# Patient Record
Sex: Male | Born: 1970 | Race: White | Hispanic: No | Marital: Married | Smoking: Never smoker
Health system: Southern US, Community
[De-identification: ages and names within clinical notes are randomized; demographics above are authoritative.]

## PROBLEM LIST (undated history)

## (undated) DIAGNOSIS — S83519A Sprain of anterior cruciate ligament of unspecified knee, initial encounter: Secondary | ICD-10-CM

## (undated) DIAGNOSIS — I10 Essential (primary) hypertension: Secondary | ICD-10-CM

## (undated) DIAGNOSIS — Z86711 Personal history of pulmonary embolism: Secondary | ICD-10-CM

## (undated) DIAGNOSIS — I82409 Acute embolism and thrombosis of unspecified deep veins of unspecified lower extremity: Secondary | ICD-10-CM

## (undated) DIAGNOSIS — T8859XA Other complications of anesthesia, initial encounter: Secondary | ICD-10-CM

## (undated) DIAGNOSIS — C801 Malignant (primary) neoplasm, unspecified: Secondary | ICD-10-CM

## (undated) DIAGNOSIS — K429 Umbilical hernia without obstruction or gangrene: Secondary | ICD-10-CM

## (undated) HISTORY — PX: TONSILLECTOMY: SUR1361

## (undated) HISTORY — DX: Umbilical hernia without obstruction or gangrene: K42.9

## (undated) HISTORY — DX: Sprain of anterior cruciate ligament of unspecified knee, initial encounter: S83.519A

---

## 2000-06-14 DIAGNOSIS — S83519A Sprain of anterior cruciate ligament of unspecified knee, initial encounter: Secondary | ICD-10-CM

## 2000-06-14 HISTORY — DX: Sprain of anterior cruciate ligament of unspecified knee, initial encounter: S83.519A

## 2001-06-14 DIAGNOSIS — K429 Umbilical hernia without obstruction or gangrene: Secondary | ICD-10-CM

## 2001-06-14 HISTORY — DX: Umbilical hernia without obstruction or gangrene: K42.9

## 2002-06-14 HISTORY — PX: HERNIA REPAIR: SHX51

## 2003-06-09 ENCOUNTER — Inpatient Hospital Stay (HOSPITAL_COMMUNITY): Admission: EM | Admit: 2003-06-09 | Discharge: 2003-06-10 | Payer: Self-pay | Admitting: Emergency Medicine

## 2016-04-01 ENCOUNTER — Ambulatory Visit (INDEPENDENT_AMBULATORY_CARE_PROVIDER_SITE_OTHER): Payer: Managed Care, Other (non HMO) | Admitting: Physician Assistant

## 2016-04-01 VITALS — BP 138/88 | HR 79 | Temp 97.9°F | Resp 17 | Ht 76.0 in | Wt 292.0 lb

## 2016-04-01 DIAGNOSIS — E669 Obesity, unspecified: Secondary | ICD-10-CM | POA: Insufficient documentation

## 2016-04-01 DIAGNOSIS — K429 Umbilical hernia without obstruction or gangrene: Secondary | ICD-10-CM | POA: Diagnosis not present

## 2016-04-01 DIAGNOSIS — R55 Syncope and collapse: Secondary | ICD-10-CM | POA: Diagnosis not present

## 2016-04-01 DIAGNOSIS — Z131 Encounter for screening for diabetes mellitus: Secondary | ICD-10-CM

## 2016-04-01 DIAGNOSIS — Z6835 Body mass index (BMI) 35.0-35.9, adult: Secondary | ICD-10-CM

## 2016-04-01 DIAGNOSIS — E66811 Obesity, class 1: Secondary | ICD-10-CM | POA: Insufficient documentation

## 2016-04-01 LAB — COMPREHENSIVE METABOLIC PANEL
ALBUMIN: 4.5 g/dL (ref 3.6–5.1)
ALK PHOS: 51 U/L (ref 40–115)
ALT: 24 U/L (ref 9–46)
AST: 18 U/L (ref 10–40)
BUN: 14 mg/dL (ref 7–25)
CHLORIDE: 101 mmol/L (ref 98–110)
CO2: 27 mmol/L (ref 20–31)
CREATININE: 1.08 mg/dL (ref 0.60–1.35)
Calcium: 9.8 mg/dL (ref 8.6–10.3)
Glucose, Bld: 103 mg/dL — ABNORMAL HIGH (ref 65–99)
POTASSIUM: 4.2 mmol/L (ref 3.5–5.3)
Sodium: 139 mmol/L (ref 135–146)
Total Bilirubin: 0.8 mg/dL (ref 0.2–1.2)
Total Protein: 7.3 g/dL (ref 6.1–8.1)

## 2016-04-01 LAB — POCT URINALYSIS DIP (MANUAL ENTRY)
BILIRUBIN UA: NEGATIVE
BILIRUBIN UA: NEGATIVE
GLUCOSE UA: NEGATIVE
LEUKOCYTES UA: NEGATIVE
NITRITE UA: NEGATIVE
Protein Ur, POC: NEGATIVE
Spec Grav, UA: >=1.03
Urobilinogen, UA: 0.2
pH, UA: 5.5

## 2016-04-01 LAB — CBC WITH DIFFERENTIAL/PLATELET
Basophils Absolute: 0 cells/uL (ref 0–200)
Basophils Relative: 0 %
EOS PCT: 0 %
Eosinophils Absolute: 0 cells/uL — ABNORMAL LOW (ref 15–500)
HEMATOCRIT: 46.8 % (ref 38.5–50.0)
Hemoglobin: 15.9 g/dL (ref 13.2–17.1)
LYMPHS PCT: 11 %
Lymphs Abs: 1364 cells/uL (ref 850–3900)
MCH: 30.6 pg (ref 27.0–33.0)
MCHC: 34 g/dL (ref 32.0–36.0)
MCV: 90.2 fL (ref 80.0–100.0)
MONOS PCT: 6 %
MPV: 9.1 fL (ref 7.5–12.5)
Monocytes Absolute: 744 cells/uL (ref 200–950)
NEUTROS PCT: 83 %
Neutro Abs: 10292 cells/uL — ABNORMAL HIGH (ref 1500–7800)
PLATELETS: 301 10*3/uL (ref 140–400)
RBC: 5.19 MIL/uL (ref 4.20–5.80)
RDW: 13.1 % (ref 11.0–15.0)
WBC: 12.4 10*3/uL — AB (ref 3.8–10.8)

## 2016-04-01 LAB — TSH: TSH: 1.32 mIU/L (ref 0.40–4.50)

## 2016-04-01 LAB — POC MICROSCOPIC URINALYSIS (UMFC): MUCUS RE: ABSENT

## 2016-04-01 LAB — LIPID PANEL
CHOLESTEROL: 161 mg/dL (ref 125–200)
HDL: 52 mg/dL (ref >=40)
LDL Cholesterol: 95 mg/dL (ref <130)
TRIGLYCERIDES: 72 mg/dL (ref <150)
Total CHOL/HDL Ratio: 3.1 Ratio (ref <=5.0)
VLDL: 14 mg/dL (ref <30)

## 2016-04-01 LAB — GLUCOSE, POCT (MANUAL RESULT ENTRY): POC Glucose: 106 mg/dl — AB (ref 70–99)

## 2016-04-01 LAB — POCT GLYCOSYLATED HEMOGLOBIN (HGB A1C): Hemoglobin A1C: 5.5

## 2016-04-01 NOTE — Progress Notes (Signed)
Subjective:    Patient ID: James Jordan, male    DOB: 1971/03/23, 45 y.o.   MRN: [id number]  Chief Complaint  Patient presents with  . Near Syncope    Pt would like to be checked for diabetes   Patient Care Team: No Pcp Per Patient as PCP - General (General Practice)  HPI: Presents for "near syncopal" episodes increasing the past few weeks with the most recent one this morning at work. States around 9am this morning at work he was shaking, had "wobbly legs" and felt unsteady and weak, lightheaded, with some associated heart palpitations. To relieve the episode he sat down and was given orange juice which helped, and he was able to drive home from work and get himself here. Pt works at [employer], states he was not doing any heavy lifting at the time. States he does do repetitive lifting at work but nothing particularly heavy or strenuous. Denies chest pain, shortness of breath, dizziness, vision changes, hearing changes, nausea, or vomiting. Does admit to some associated tingling in his hands with these episodes. Mother is an insulin-dependent diabetic, maternal uncle and grandfather also with DM. Endorses unhealthy diet, often eating "fudge squares" or "swiss cakes", coffee, fruit for breakfast and fast-food multiple times a week. Wakes up around 4am for work and states he does not notice these episodes occurring at any certain time. Endorses increased impotence over the past year. 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 Denies polydipsia, polydipsia, polyphagia. Uses smokeless tobacco but denies current or history of cigarette smoking. Last EKG unknown.   Allergies: No known drug, food, or seasonal allergies per patient.   Patient Active Problem List   Diagnosis Date Noted  . BMI 35.0-35.9,adult 04/01/2016   Past Surgical History:  Procedure Laterality Date  . HERNIA REPAIR  [id number]   Umbilical   Family History  Problem Relation Age of Onset   . Diabetes Mother   . Diabetes Maternal Uncle   . Diabetes Maternal Grandfather     Prior to Admission medications   Not on File    Review of Systems  Constitutional: Negative for appetite change, chills, diaphoresis and fever.  HENT: Negative for congestion, ear discharge, ear pain, hearing loss, postnasal drip, rhinorrhea, sore throat and tinnitus.   Eyes: Negative for pain, discharge, redness, itching and visual disturbance.  Respiratory: Negative for cough, chest tightness, shortness of breath and wheezing.   Cardiovascular: Negative for chest pain and leg swelling.  Gastrointestinal: Negative for abdominal pain, blood in stool, constipation and diarrhea.  Genitourinary: Negative for decreased urine volume, difficulty urinating, dysuria, frequency, hematuria and urgency.  Musculoskeletal: Negative for arthralgias, joint swelling and myalgias.  Neurological: Positive for light-headedness. Negative for dizziness, syncope, speech difficulty, numbness and headaches.  Hematological: Negative for adenopathy.       Objective:   Physical Exam General: Obese male, appears stated age and in no apparent distress. HEENT: Head: Normocephalic, atraumatic. Eyes: PERRLA, sclera and conjunctiva clear without injection or icterus. Fundoscopic exam on right and left eyes with appropriate cup to disc ratio, no AV nicking, no papilledema. Ears: Bilateral canals clear with no lesions, tympanic membranes pearly gray, intact with visible boney landmarks and cone of light. Nose: Patent. Mucosa deep pink, glistening with no discharge, masses or lesions. No septum perforations, exudates, or polyps. Throat: Mouth and throat non-erythematous without evidence of tonsillar hypertrophy or exudate. No cobble stoning. Neck: Supple. No thyromegaly or lymphadenopathy. No tracheal deviations or  JVD noted. Pulmonary: Clear to auscultation bilaterally, no wheezes, rhonchi, or rales. No cyanosis or  clubbing. Cardiovascular: Regular rate and rhythm with normal S1 and S2 without murmurs, rubs, or gallops. Popliteal, posterior tibialis, and dorsalis pedis pulses 2+ bilaterally. Abdominal: Normoactive bowel sounds in all four quadrants. Non-tender to light and deep palpation in all four quadrants, no organomegaly, no distention. 1.5 inch umbilical hernia, more pronounced with increased abdominal pressure but spontaneously reduces upon relaxation. No rebound or guarding. Neurological: Awake, alert, oriented. DTRs 2+ bilaterally at patella, biceps, Achilles. Sensation equal bilaterally in lower extremities. Skin: Skin warm and dry. No rashes noted. Psychiatric: Appropriate mood and affect. Fluent speech and normal behavior.   Orthostatic VS for the past 24 hrs:  BP- Lying Pulse- Lying BP- Sitting Pulse- Sitting BP- Standing at 0 minutes Pulse- Standing at 0 minutes  04/01/16 1253 144/84 64 150/85 85 (!) 142/91 87   Orthostatic vitals reviewed, increased pulse from lying to sitting but normal blood pressure.  EKG interpreted and reviewed with Dr. Tamala Julian, 66 bpm, sinus rhythm, left atrial enlargement.  Results for orders placed or performed in visit on 04/01/16  POCT glucose (manual entry)  Result Value Ref Range   POC Glucose 106 (A) 70 - 99 mg/dl  POCT glycosylated hemoglobin (Hb A1C)  Result Value Ref Range   Hemoglobin A1C 5.5   POCT urinalysis dipstick  Result Value Ref Range   Color, UA yellow yellow   Clarity, UA clear clear   Glucose, UA negative negative   Bilirubin, UA negative negative   Ketones, POC UA negative negative   Spec Grav, UA >=1.030    Blood, UA trace-intact (A) negative   pH, UA 5.5    Protein Ur, POC negative negative   Urobilinogen, UA 0.2    Nitrite, UA Negative Negative   Leukocytes, UA Negative Negative  POCT Microscopic Urinalysis ([facility])  Result Value Ref Range   WBC,UR,HPF,POC None None WBC/hpf   RBC,UR,HPF,POC None None RBC/hpf   Bacteria None  None, Too numerous to count   Mucus Absent Absent   Epithelial Cells, UR Per Microscopy None None, Too numerous to count cells/hpf        Assessment & Plan:  1. Near syncope EKG within normal limits, POCT glucose 106 mg/dL, HgbA1C 5.5, orthostatic vital signs within normal limits. UA with increased specific gravity, patient likely dehydrated causing pre-syncopal episodes. Discussed with patient who endorses infrequent water intake and drinking mostly soda and sugary drinks. CBC, CMP, TSH pending.  - POCT glucose (manual entry) - POCT urinalysis dipstick - POCT Microscopic Urinalysis ([facility]) - CBC with Differential/Platelet - Comprehensive metabolic panel - TSH - EKG 12-Lead - Orthostatic vital signs  2. Screening for diabetes mellitus Family history of diabetes mellitus, has not been previously screened. Labs below indicate no current evidence of diabetes. Recommended healthy diet and exercise. - POCT glucose 106 mg/dL  - POCT glycosylated hemoglobin (Hb A1C) 5.5   3. BMI 35.0-35.9,adult Patient obese, endorses unhealthy diet, and rare cardiovascular exercise, lipid panel screening warranted. Recommended initiating more healthy diet and increasing cardiovascular exercise. - Lipid panel  Advised patient to take work off tomorrow, rest, and increase water intake. Recommended to return if symptoms worsen or are not improving.

## 2016-04-01 NOTE — Progress Notes (Signed)
Patient ID: James Jordan, male     DOB: [birth year], 45 y.o.    MRN: [id number]  PCP: No PCP Per Patient  Chief Complaint  Patient presents with  . Near Syncope    Pt would like to be checked for diabetes    Subjective:    HPI  Presents wanting to be checked for diabetes. He is accompanied by his wife.  Today at work, about 9 am, he began to feel jittery, leg weakness/"wobbly," lightheaded, heart palpitations, like he might faint. He clarifies that he did NOT have dizziness/room spinning, visual disturbance, chest pain, SOB, HA. He drank some OJ and sat down for a while, and was able to drive himself home.  He had a similar episode yesterday while driving, though it was not as severe and did not last as long. He also relates other episodes over the past several weeks.  His mother has diabetes, require insulin. He does not eat a healthy diet and does not exercise. Often eats high sugar snacks for breakfast, at 4 am. ED x several months.  No polydipsia, polyuria, polyphagia, weight changes, visual changes, rash.   Prior to Admission medications   Not on File     No Known Allergies   Patient Active Problem List   Diagnosis Date Noted  . BMI 35.0-35.9,adult 04/01/2016     Family History  Problem Relation Age of Onset  . Diabetes Mother   . Diabetes Maternal Uncle   . Diabetes Maternal Grandfather      Social History   Social History  . Marital status: Married    Spouse name: N/A  . Number of children: N/A  . Years of education: N/A   Occupational History  . warehouse    Social History Main Topics  . Smoking status: Never Smoker  . Smokeless tobacco: Current User    Types: Snuff  . Alcohol use No  . Drug use: No  . Sexual activity: Yes   Other Topics Concern  . Not on file   Social History Narrative   Lives with his wife.           Review of Systems As above. No respiratory symptoms. No GU/GI symptoms. No  myalgias/arthralgias.      Objective:  Physical Exam  Constitutional: He is oriented to person, place, and time. He appears well-developed and well-nourished. He is active and cooperative. No distress.  BP 138/88 (BP Location: Right Arm, Patient Position: Sitting, Cuff Size: Large)   Pulse 79   Temp 97.9 F (36.6 C) (Oral)   Resp 17   Ht 6\' 4"  (1.93 m)   Wt 292 lb (132.5 kg)   SpO2 99%   BMI 35.54 kg/m   HENT:  Head: Normocephalic and atraumatic.  Right Ear: Hearing, tympanic membrane, external ear and ear canal normal.  Left Ear: Hearing, tympanic membrane, external ear and ear canal normal.  Nose: Nose normal.  Mouth/Throat: Uvula is midline, oropharynx is clear and moist and mucous membranes are normal.  Eyes: Conjunctivae, EOM and lids are normal. Pupils are equal, round, and reactive to light. No scleral icterus.  Neck: Normal range of motion, full passive range of motion without pain and phonation normal. Neck supple. No thyromegaly present.  Cardiovascular: Normal rate, regular rhythm and normal heart sounds.   Pulses:      Radial pulses are 2+ on the right side, and 2+ on the left side.  Pulmonary/Chest: Effort normal and breath sounds normal.  Abdominal: Normal  appearance and bowel sounds are normal. A hernia is present.    Lymphadenopathy:       Head (right side): No tonsillar, no preauricular, no posterior auricular and no occipital adenopathy present.       Head (left side): No tonsillar, no preauricular, no posterior auricular and no occipital adenopathy present.    He has no cervical adenopathy.       Right: No supraclavicular adenopathy present.       Left: No supraclavicular adenopathy present.  Neurological: He is alert and oriented to person, place, and time. No sensory deficit.  Skin: Skin is warm, dry and intact. No rash noted. No cyanosis or erythema. Nails show no clubbing.  Psychiatric: He has a normal mood and affect. His speech is normal and behavior  is normal.     Results for orders placed or performed in visit on 04/01/16  POCT glucose (manual entry)  Result Value Ref Range   POC Glucose 106 (A) 70 - 99 mg/dl  POCT glycosylated hemoglobin (Hb A1C)  Result Value Ref Range   Hemoglobin A1C 5.5   POCT urinalysis dipstick  Result Value Ref Range   Color, UA yellow yellow   Clarity, UA clear clear   Glucose, UA negative negative   Bilirubin, UA negative negative   Ketones, POC UA negative negative   Spec Grav, UA >=1.030    Blood, UA trace-intact (A) negative   pH, UA 5.5    Protein Ur, POC negative negative   Urobilinogen, UA 0.2    Nitrite, UA Negative Negative   Leukocytes, UA Negative Negative  POCT Microscopic Urinalysis ([facility])  Result Value Ref Range   WBC,UR,HPF,POC None None WBC/hpf   RBC,UR,HPF,POC None None RBC/hpf   Bacteria None None, Too numerous to count   Mucus Absent Absent   Epithelial Cells, UR Per Microscopy None None, Too numerous to count cells/hpf    EKG reviewed with Dr. Tamala Julian. No acute findings. No dysrhythmia or ischemia.    Orthostatic VS for the past 24 hrs:  BP- Lying Pulse- Lying BP- Sitting Pulse- Sitting BP- Standing at 0 minutes Pulse- Standing at 0 minutes  04/01/16 1253 144/84 64 150/85 85 (!) 142/91 87         Assessment & Plan:  1. Near syncope Etiology not clear. Most likely due to inadequate hydration and nutrition. Increase hydration. Improve food choices. Await remaining labs. RTC if symptoms worsen/persist. - POCT glucose (manual entry) - POCT urinalysis dipstick - POCT Microscopic Urinalysis ([facility]) - CBC with Differential/Platelet - Comprehensive metabolic panel - TSH - EKG 12-Lead - Orthostatic vital signs  2. Screening for diabetes mellitus No evidence of diabetes. Healthy eating and regular exercise to reduce risk. - POCT glucose (manual entry) - POCT glycosylated hemoglobin (Hb A1C)  3. BMI 35.0-35.9,adult Healthy lifestyle changes. - Lipid panel  4.  Recurrent umbilical hernia Anticipatory guidance. He'll let us know when he is ready for referral back to surgery for repair.   Fara Chute, PA-C Physician Assistant-Certified [facility]

## 2016-04-01 NOTE — Patient Instructions (Addendum)
There is currently no evidence of diabetes. Your urine is concentrated, indicating that you need to drink more water. Drink at least 64 ounces of water daily. Please work on making healthier eating choices.    IF you received an x-ray today, you will receive an invoice from [facility]. Please contact [facility] at [phone] with questions or concerns regarding your invoice.   IF you received labwork today, you will receive an invoice from [employer]. Please contact [facility] at [phone] with questions or concerns regarding your invoice.   Our billing staff will not be able to assist you with questions regarding bills from these companies.  You will be contacted with the lab results as soon as they are available. The fastest way to get your results is to activate your My Chart account. Instructions are located on the last page of this paperwork. If you have not heard from [place] regarding the results in 2 weeks, please contact this office.

## 2016-04-02 ENCOUNTER — Encounter: Payer: Self-pay | Admitting: Physician Assistant

## 2016-05-25 ENCOUNTER — Emergency Department (HOSPITAL_COMMUNITY): Payer: 59

## 2016-05-25 ENCOUNTER — Emergency Department (HOSPITAL_COMMUNITY)
Admission: EM | Admit: 2016-05-25 | Discharge: 2016-05-25 | Disposition: A | Discharge status: Home / Self Care | Payer: 59 | Attending: Emergency Medicine | Admitting: Emergency Medicine

## 2016-05-25 ENCOUNTER — Encounter (HOSPITAL_COMMUNITY): Payer: Self-pay | Admitting: Emergency Medicine

## 2016-05-25 DIAGNOSIS — R002 Palpitations: Secondary | ICD-10-CM | POA: Diagnosis present

## 2016-05-25 LAB — BASIC METABOLIC PANEL
Anion gap: 11 (ref 5–15)
BUN: 13 mg/dL (ref 6–20)
CHLORIDE: 102 mmol/L (ref 101–111)
CO2: 25 mmol/L (ref 22–32)
CREATININE: 1.13 mg/dL (ref 0.61–1.24)
Calcium: 9.6 mg/dL (ref 8.9–10.3)
GFR calc Af Amer: >60 mL/min (ref >60)
GFR calc non Af Amer: >60 mL/min (ref >60)
GLUCOSE: 127 mg/dL — AB (ref 65–99)
Potassium: 3.9 mmol/L (ref 3.5–5.1)
Sodium: 138 mmol/L (ref 135–145)

## 2016-05-25 LAB — CBC
HCT: 47.8 % (ref 39.0–52.0)
Hemoglobin: 16.2 g/dL (ref 13.0–17.0)
MCH: 30.8 pg (ref 26.0–34.0)
MCHC: 33.9 g/dL (ref 30.0–36.0)
MCV: 90.9 fL (ref 78.0–100.0)
PLATELETS: 316 10*3/uL (ref 150–400)
RBC: 5.26 MIL/uL (ref 4.22–5.81)
RDW: 13.3 % (ref 11.5–15.5)
WBC: 9.3 10*3/uL (ref 4.0–10.5)

## 2016-05-25 NOTE — ED Triage Notes (Signed)
Pt. reports sudden onset palpitations this morning , denies chest pain , no SOB or nausea , mild lightheadedness.

## 2016-05-25 NOTE — ED Provider Notes (Signed)
[place] DEPT Provider Note   CSN: [id number] Arrival date & time: 05/25/16  0540     History   Chief Complaint Chief Complaint  Patient presents with  . Palpitations    HPI James Jordan is a 45 y.o. male.  The history is provided by the patient. No language interpreter was used.  Palpitations      James Jordan is a 45 y.o. male who presents to the Emergency Department complaining of palpitations.  He experienced about 1 hour of palpitations earlier today when he was on his way to work and when he arrived to work. He describes it as a sensation of a rapid heartbeat with associated lightheadedness. Symptoms have completely resolved at this time. He denies any associated chest pain, shortness of breath, diaphoresis, nausea, abdominal pain, leg swelling or pain. He denies any recent illness or poor by mouth intake. He has no medical problems and takes no medications. He does drink occasional caffeine but did not have much caffeine today. He denies any tobacco, alcohol, drug use. He has a family history of an uncle that just recently had cardiac bypass. No other family history of heart disease. No recent surgeries. He did have palpitations 1 month ago that was attributed to dehydration but no other recent events.  Past Medical History:  Diagnosis Date  . ACL tear - left knee 2002   Per patient, not repaired   . Umbilical hernia [id number]    Patient Active Problem List   Diagnosis Date Noted  . BMI 35.0-35.9,adult 04/01/2016    Past Surgical History:  Procedure Laterality Date  . HERNIA REPAIR  123XX123   Umbilical       Home Medications    Prior to Admission medications   Not on File    Family History Family History  Problem Relation Age of Onset  . Diabetes Mother   . Diabetes Maternal Uncle   . Diabetes Maternal Grandfather     Social History Social History  Substance Use Topics  . Smoking status: Never Smoker  . Smokeless tobacco: Current User   Types: Snuff  . Alcohol use No     Allergies   Patient has no known allergies.   Review of Systems Review of Systems  Cardiovascular: Positive for palpitations.  All other systems reviewed and are negative.    Physical Exam Updated Vital Signs BP 124/77   Pulse 75   Temp 97.9 F (36.6 C) (Oral)   Resp 15   Ht 4\' 6"  (1.372 m)   Wt 290 lb (131.5 kg)   SpO2 98%   BMI 69.92 kg/m   Physical Exam  Constitutional: He is oriented to person, place, and time. He appears well-developed and well-nourished.  HENT:  Head: Normocephalic and atraumatic.  Cardiovascular: Normal rate and regular rhythm.   No murmur heard. Pulmonary/Chest: Effort normal and breath sounds normal. No respiratory distress.  Abdominal: Soft. There is no tenderness. There is no rebound and no guarding.  Musculoskeletal: He exhibits no edema or tenderness.  Neurological: He is alert and oriented to person, place, and time.  Skin: Skin is warm and dry.  Psychiatric: He has a normal mood and affect. His behavior is normal.  Nursing note and vitals reviewed.    ED Treatments / Results  Labs (all labs ordered are listed, but only abnormal results are displayed) Labs Reviewed  BASIC METABOLIC PANEL - Abnormal; Notable for the following:       Result Value   Glucose,  Bld 127 (*)    All other components within normal limits  CBC    EKG  EKG Interpretation  Date/Time:  Tuesday May 25 2016 05:46:11 EST Ventricular Rate:  94 PR Interval:  136 QRS Duration: 86 QT Interval:  356 QTC Calculation: 445 R Axis:   77 Text Interpretation:  Sinus rhythm with Premature atrial complexes Nonspecific T wave abnormality Abnormal ECG Confirmed by Hazle Coca [id number] on 05/25/2016 7:27:25 AM       Radiology Dg Chest 2 View  Result Date: 05/25/2016 CLINICAL DATA:  45 y/o  M; palpitations. EXAM: CHEST  2 VIEW COMPARISON:  None. FINDINGS: The heart size and mediastinal contours are within normal limits. Both  lungs are clear. The visualized skeletal structures are unremarkable. IMPRESSION: No active cardiopulmonary disease. Electronically Signed   By: Kristine Garbe M.D.   On: 05/25/2016 06:11    Procedures Procedures (including critical care time)  Medications Ordered in ED Medications - No data to display   Initial Impression / Assessment and Plan / ED Course  I have reviewed the triage vital signs and the nursing notes.  Pertinent labs & imaging results that were available during my care of the patient were reviewed by me and considered in my medical decision making (see chart for details).  Clinical Course as of May 25 1617  Tue May 25, 2016  0727 ED EKG within 10 minutes [ER]    Clinical Course User Index [ER] Quintella Reichert, MD    Patient here for evaluation of palpitations. He has no chest pain or shortness of breath. Current clinical picture is not consistent with ACS, PE, pneumonia. EKG does demonstrate a few PACs. Discussed with patient unclear etiology of his palpitations but he has been asymptomatic during the ED stay. Plan to DC home with cardiology follow-up so he can obtain a Holter monitor for further evaluation. Home care and return precautions discussed.  Final Clinical Impressions(s) / ED Diagnoses   Final diagnoses:  Palpitations    New Prescriptions There are no discharge medications for this patient.    Quintella Reichert, MD 05/25/16 [phone]

## 2016-05-25 NOTE — ED Notes (Signed)
Pt ambulated back to room from waiting room, tolerated well.

## 2016-05-26 ENCOUNTER — Telehealth: Payer: Self-pay | Admitting: Interventional Cardiology

## 2016-05-26 NOTE — Telephone Encounter (Signed)
New message  Pt is experiencing some nervousness/anxiety first thing in am   Please call pt back and discuss

## 2016-05-26 NOTE — Telephone Encounter (Signed)
Returned call.  Patient has appt with Dr Irish Lack on Monday.  He was asking if he could have some anxiety issue causing his palpitations.  I advised that he she his PCP if he is having unusual anxiety.

## 2016-06-01 ENCOUNTER — Encounter: Payer: Self-pay | Admitting: Interventional Cardiology

## 2016-06-01 ENCOUNTER — Encounter (INDEPENDENT_AMBULATORY_CARE_PROVIDER_SITE_OTHER): Payer: Self-pay

## 2016-06-01 ENCOUNTER — Ambulatory Visit (INDEPENDENT_AMBULATORY_CARE_PROVIDER_SITE_OTHER): Payer: 59 | Admitting: Interventional Cardiology

## 2016-06-01 VITALS — BP 126/68 | HR 72 | Ht 76.0 in | Wt 295.0 lb

## 2016-06-01 DIAGNOSIS — R002 Palpitations: Secondary | ICD-10-CM

## 2016-06-01 DIAGNOSIS — I491 Atrial premature depolarization: Secondary | ICD-10-CM

## 2016-06-01 NOTE — Progress Notes (Signed)
Cardiology Office Note   Date:  06/01/2016   ID:  [facility], DOB 1970/11/23, MRN [id number]  PCP:  No PCP Per Patient    No chief complaint on file. Follow-up palpitations   Wt Readings from Last 3 Encounters:  06/01/16 295 lb (133.8 kg)  05/25/16 290 lb (131.5 kg)  04/01/16 292 lb (132.5 kg)       History of Present Illness: James [facility] is a 45 y.o. male  Who had a dizziness a few months ago.  This resolved with more water intake.    Last week, he had more lightrheadedness, but not as severe as it was in 10/19.  He got anxious and felt his heart racing.  He describes it as a sensation of a rapid heartbeat with associated lightheadedness.  He denies any associated chest pain, shortness of breath, diaphoresis, nausea, abdominal pain, leg swelling or pain.  He walks a lot at work.  Now works in a Proofreader.  He walks carrying items.  No cardiac sx with this.     Past Medical History:  Diagnosis Date  . ACL tear - left knee 2002   Per patient, not repaired   . Umbilical hernia [id number]    Past Surgical History:  Procedure Laterality Date  . HERNIA REPAIR  [id number]   Umbilical     No current outpatient prescriptions on file.   No current facility-administered medications for this visit.     Allergies:   Patient has no known allergies.    Social History:  The patient  reports that he has never smoked. His smokeless tobacco use includes Snuff. He reports that he does not drink alcohol or use drugs.   Family History:  The patient's family history includes Diabetes in his maternal grandfather, maternal uncle, and mother; Heart disease in his maternal aunt.    ROS:  Please see the history of present illness.   Otherwise, review of systems are positive for one episode of palpitations.   All other systems are reviewed and negative.    PHYSICAL EXAM: VS:  BP 126/68   Pulse 72   Ht 6\' 4"  (1.93 m)   Wt 295 lb (133.8 kg)   BMI 35.91 kg/m  , BMI Body mass  index is 35.91 kg/m. GEN: Well nourished, well developed, in no acute distress  HEENT: normal  Neck: no JVD, carotid bruits, or masses Cardiac: RRR; no murmurs, rubs, or gallops,no edema  Respiratory:  clear to auscultation bilaterally, normal work of breathing GI: soft, nontender, nondistended, + BS MS: no deformity or atrophy  Skin: warm and dry, no rash Neuro:  Strength and sensation are intact Psych: euthymic mood, full affect   EKG:   The ekg ordered 12/21 demonstrated NSR, inferior ST changes, occasional PACs   Recent Labs: 04/01/2016: ALT 24; TSH 1.32 05/25/2016: BUN 13; Creatinine, Ser 1.13; Hemoglobin 16.2; Platelets 316; Potassium 3.9; Sodium 138   Lipid Panel    Component Value Date/Time   CHOL 161 04/01/2016 1246   TRIG 72 04/01/2016 1246   HDL 52 04/01/2016 1246   CHOLHDL 3.1 04/01/2016 1246   VLDL 14 04/01/2016 1246   [place] 95 04/01/2016 1246     Other studies Reviewed: Additional studies/ records that were reviewed today with results demonstrating: ER notes reviewed.   ASSESSMENT AND PLAN:  1. Palpitations: He states he was very anxious when the palpitations happen. He was feeling some lightheadedness after not drinking a lot of water. Symptoms have  resolved. He is back to staying well hydrated.  Palpitations have resolved. No chest discomfort. He is very active at work. 2. PACs: This was noted on his last ECG. He could have palpitations symptoms from PACs it's unclear whether the 2 are related. 3. His exam is normal. There is no evidence of structural heart disease. No evidence of heart failure. This is unlikely to be a ventricular arrhythmia. He has not had syncope. Workup in the emergency room was negative. At this point, an outpatient monitor would likely be low yield. If he has more symptoms, he'll let us know and we can reconsider an outpatient monitor. I encouraged him to continue to be physically active. If he has any change in his symptoms, he should  let us know.   Current medicines are reviewed at length with the patient today.  The patient concerns regarding his medicines were addressed.  The following changes have been made:  No change  Labs/ tests ordered today include:  No orders of the defined types were placed in this encounter.   Recommend 150 minutes/week of aerobic exercise Low fat, low carb, high fiber diet recommended  Disposition:   FU prn; call if palpitations return   Signed, Larae Grooms, MD  06/01/2016 11:18 AM    [address], [place], [place]  [address] Phone: [phone]; Fax: [phone]

## 2016-06-01 NOTE — Patient Instructions (Signed)
Medication Instructions:  Same-no changes  Labwork: None  Testing/Procedures: None  Follow-Up: As needed     If you need a refill on your cardiac medications before your next appointment, please call your pharmacy.   

## 2016-07-05 ENCOUNTER — Ambulatory Visit: Payer: 59 | Admitting: Interventional Cardiology

## 2018-01-08 IMAGING — CR DG CHEST 2V
2 series · 2 of 2 positions shown · non-contrast
Comparison: None.

CLINICAL DATA: 45 y/o  M; palpitations.

EXAM:
CHEST  2 VIEW

[chest pa]
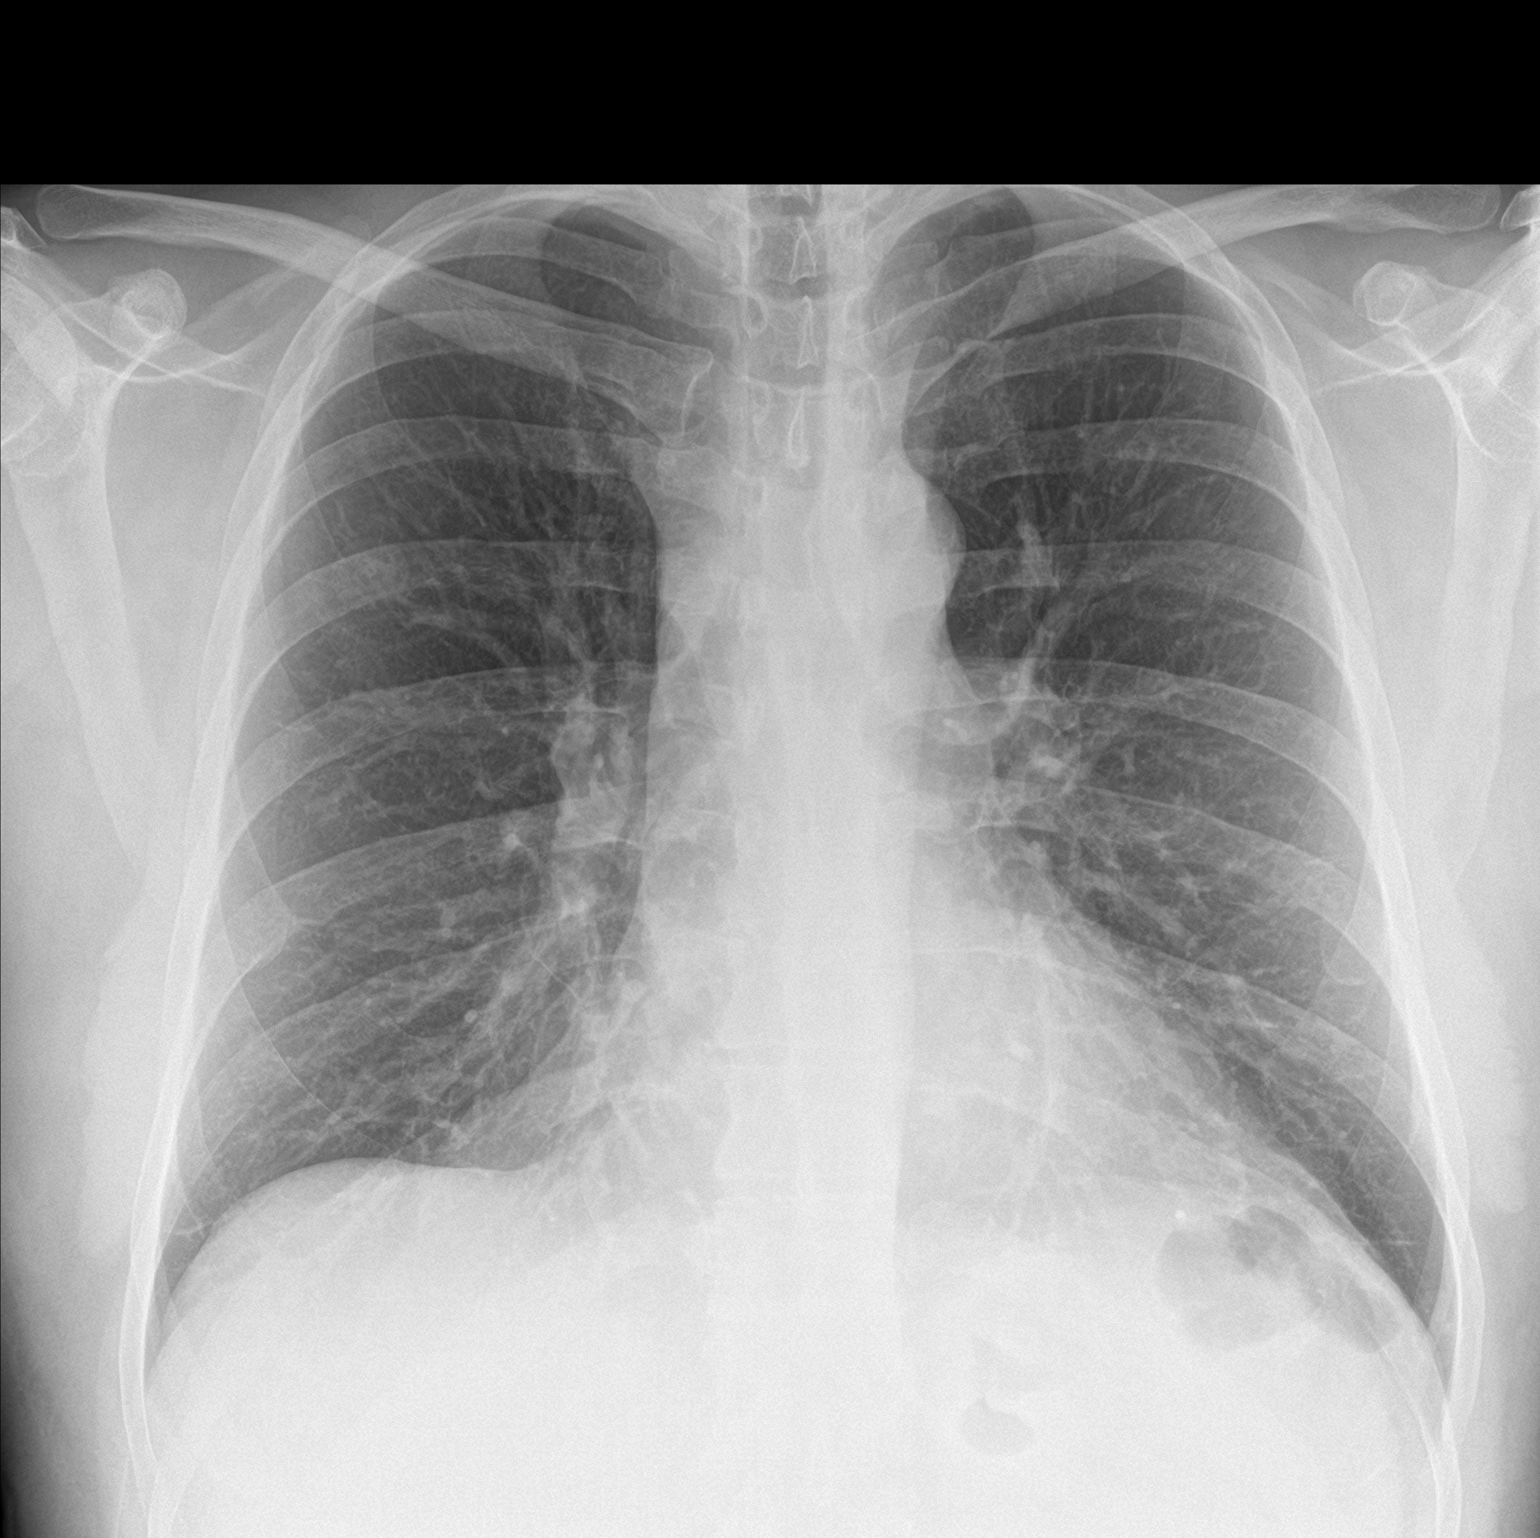

[chest lat]
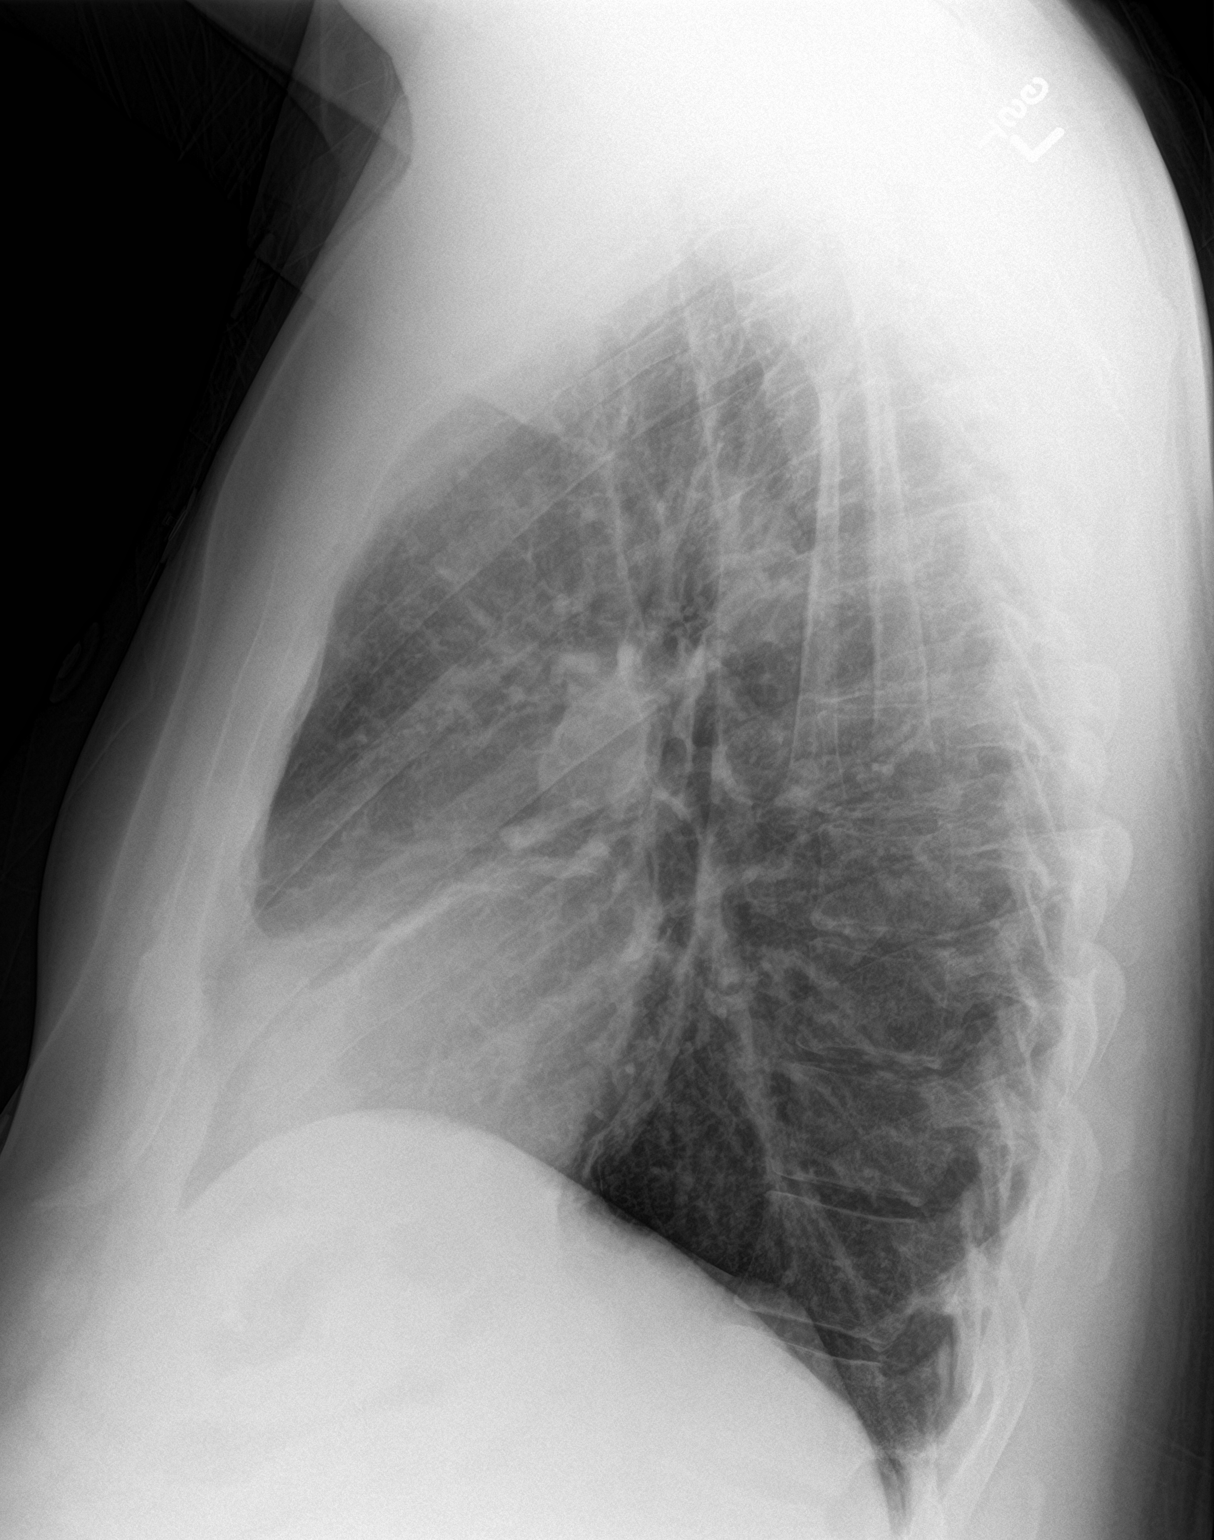

[2 of 2 positions shown; findings below may reference images not displayed]

FINDINGS: The heart size and mediastinal contours are within normal limits.
Both lungs are clear. The visualized skeletal structures are
unremarkable.
IMPRESSION: No active cardiopulmonary disease.

By: Azu Loco M.D.

## 2018-06-14 HISTORY — PX: EYE SURGERY: SHX253

## 2018-10-19 ENCOUNTER — Encounter (HOSPITAL_COMMUNITY): Payer: Self-pay | Admitting: Emergency Medicine

## 2018-10-19 ENCOUNTER — Emergency Department (HOSPITAL_COMMUNITY): Payer: 59

## 2018-10-19 ENCOUNTER — Emergency Department (HOSPITAL_COMMUNITY)
Admission: EM | Admit: 2018-10-19 | Discharge: 2018-10-20 | Disposition: A | Discharge status: Home / Self Care | Payer: 59 | Attending: Emergency Medicine | Admitting: Emergency Medicine

## 2018-10-19 ENCOUNTER — Other Ambulatory Visit: Payer: Self-pay

## 2018-10-19 DIAGNOSIS — R002 Palpitations: Secondary | ICD-10-CM | POA: Diagnosis not present

## 2018-10-19 DIAGNOSIS — R42 Dizziness and giddiness: Secondary | ICD-10-CM

## 2018-10-19 LAB — BASIC METABOLIC PANEL
Anion gap: 13 (ref 5–15)
BUN: 16 mg/dL (ref 6–20)
CO2: 24 mmol/L (ref 22–32)
Calcium: 9.4 mg/dL (ref 8.9–10.3)
Chloride: 103 mmol/L (ref 98–111)
Creatinine, Ser: 1.18 mg/dL (ref 0.61–1.24)
GFR calc Af Amer: >60 mL/min (ref >60)
GFR calc non Af Amer: >60 mL/min (ref >60)
Glucose, Bld: 115 mg/dL — ABNORMAL HIGH (ref 70–99)
Potassium: 3.7 mmol/L (ref 3.5–5.1)
Sodium: 140 mmol/L (ref 135–145)

## 2018-10-19 LAB — CBC
HCT: 46.7 % (ref 39.0–52.0)
Hemoglobin: 15.4 g/dL (ref 13.0–17.0)
MCH: 30.2 pg (ref 26.0–34.0)
MCHC: 33 g/dL (ref 30.0–36.0)
MCV: 91.6 fL (ref 80.0–100.0)
Platelets: 320 10*3/uL (ref 150–400)
RBC: 5.1 MIL/uL (ref 4.22–5.81)
RDW: 12.8 % (ref 11.5–15.5)
WBC: 8.8 10*3/uL (ref 4.0–10.5)
nRBC: 0 % (ref 0.0–0.2)

## 2018-10-19 LAB — TROPONIN I: Troponin I: <0.03 ng/mL (ref <0.03)

## 2018-10-19 MED ORDER — SODIUM CHLORIDE 0.9% FLUSH: 3.0000 mL | Freq: Once | INTRAVENOUS | Status: DC

## 2018-10-19 NOTE — ED Provider Notes (Signed)
[facility] EMERGENCY DEPARTMENT Provider Note   CSN: [id number] Arrival date & time: 10/19/18  2101    History   Chief Complaint Chief Complaint  Patient presents with  . Tachycardia    HPI James [facility] is a 48 y.o. male with no PMHx presenting to the ED with lightheadedness and heart palpitations. He states his lightheadedness started a few weeks ago. 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 He states once he steps outside his lightheadedness resolves and other times it just goes away on its own. These episodes last anywhere from a few minutes to half an hour.  He noticed that his heart was racing this evening after he had dinner.  He was at rest when his symptoms started.  He states he had similar symptoms of lightheadedness and heart palpitations couple years ago.  At that time his cardiac work-up was normal he was told it was due to dehydration.  He states that he drinks about 2 Gatorade's at work daily and tries to increase water intake in the evenings.  He denies fevers, chills, chest pain, SOB, nausea vomiting, changes in his vision, headaches, dizziness, vertigo, weakness, gait instability, diarrhea or constipation, abdominal pain.  He states that he has noticed his right eye has been more red on the lower lid than usual.    HPI  Past Medical History:  Diagnosis Date  . ACL tear - left knee 2002   Per patient, not repaired   . Umbilical hernia 2025    Patient Active Problem List   Diagnosis Date Noted  . PAC (premature atrial contraction) 06/01/2016  . BMI 35.0-35.9,adult 04/01/2016    Past Surgical History:  Procedure Laterality Date  . HERNIA REPAIR  4270   Umbilical        Home Medications    Prior to Admission medications   Not on File    Family History Family History  Problem Relation Age of Onset  . Diabetes Mother   .  Diabetes Maternal Uncle   . Diabetes Maternal Grandfather   . Heart disease Maternal Aunt     Social History Social History   Tobacco Use  . Smoking status: Never Smoker  . Smokeless tobacco: Current User    Types: Snuff  Substance Use Topics  . Alcohol use: No  . Drug use: No     Allergies   Patient has no known allergies.   Review of Systems Review of Systems  Constitutional: Negative for activity change, appetite change, chills, diaphoresis, fatigue, fever and unexpected weight change.  HENT: Negative for congestion, ear pain, rhinorrhea, sinus pressure, sinus pain and sore throat.   Eyes: Positive for redness. Negative for photophobia, pain, discharge, itching and visual disturbance.  Respiratory: Negative for cough, chest tightness and shortness of breath.   Cardiovascular: Negative for chest pain.  Gastrointestinal: Negative for abdominal distention, abdominal pain, blood in stool, constipation, diarrhea, nausea and vomiting.  Genitourinary: Negative for difficulty urinating, dysuria, hematuria and urgency.  Musculoskeletal: Negative for arthralgias, back pain, gait problem and myalgias.  Neurological: Positive for light-headedness. Negative for dizziness, weakness and headaches.     Physical Exam Updated Vital Signs BP 121/80   Pulse 68   Resp 15   Ht 6\' 4"  (1.93 m)   Wt 132.9 kg   SpO2 98%   BMI 35.67 kg/m   Physical Exam Constitutional:  General: He is not in acute distress.    Appearance: Normal appearance. He is obese.  HENT:     Head: Normocephalic and atraumatic.  Eyes:     Extraocular Movements: Extraocular movements intact.     Conjunctiva/sclera: Conjunctivae normal.     Pupils: Pupils are equal, round, and reactive to light.  Neck:     Musculoskeletal: Normal range of motion and neck supple. No muscular tenderness.  Cardiovascular:     Rate and Rhythm: Normal rate and regular rhythm.     Pulses: Normal pulses.     Heart sounds: Normal  heart sounds.  Pulmonary:     Effort: Pulmonary effort is normal. No respiratory distress.     Breath sounds: No wheezing or rales.  Abdominal:     General: Abdomen is flat. Bowel sounds are normal. There is no distension.     Palpations: Abdomen is soft.     Tenderness: There is no abdominal tenderness.  Musculoskeletal:        General: No swelling or tenderness.     Right lower leg: No edema.     Left lower leg: No edema.  Skin:    General: Skin is warm and dry.  Neurological:     General: No focal deficit present.     Mental Status: He is alert and oriented to person, place, and time.     Cranial Nerves: No cranial nerve deficit.     Sensory: No sensory deficit.     Motor: No weakness.  Psychiatric:        Mood and Affect: Mood normal.        Behavior: Behavior normal.        Thought Content: Thought content normal.        Judgment: Judgment normal.      ED Treatments / Results  Labs (all labs ordered are listed, but only abnormal results are displayed) Labs Reviewed  BASIC METABOLIC PANEL - Abnormal; Notable for the following components:      Result Value   Glucose, Bld 115 (*)    All other components within normal limits  CBC  TROPONIN I    EKG EKG Interpretation  Date/Time:  Thursday Oct 19 2018 21:07:58 EDT Ventricular Rate:  90 PR Interval:  122 QRS Duration: 88 QT Interval:  362 QTC Calculation: 442 R Axis:   68 Text Interpretation:  Normal sinus rhythm Possible Left atrial enlargement T wave abnormality, consider inferior ischemia Abnormal ECG No significant change since last tracing Confirmed by Addison Lank [phone]) on 10/19/2018 11:27:57 PM   Radiology Dg Chest 2 View  Result Date: 10/19/2018 CLINICAL DATA:  Tachycardia EXAM: CHEST - 2 VIEW COMPARISON:  None. FINDINGS: Scarring at the lingula and left base. No focal opacity or pleural effusion. Normal heart size. No pneumothorax. IMPRESSION: No active cardiopulmonary disease. Electronically Signed    By: Donavan Foil M.D.   On: 10/19/2018 21:41    Procedures Procedures (including critical care time)  Orthostatic Vitals BP lying 128/78 BP sitting 122/84 BP standing at 0 minutes 122/87 BP standing at 3 minutes 132/83  Medications Ordered in ED Medications  sodium chloride flush (NS) 0.9 % injection 3 mL (has no administration in time range)     Initial Impression / Assessment and Plan / ED Course  I have reviewed the triage vital signs and the nursing notes.  Pertinent labs & imaging results that were available during my care of the patient were reviewed by me and considered in  my medical decision making (see chart for details).  Pt is a 48 yo male presenting to the ED with lightheadedness of 3 weeks duration and heart palpitations that started this evening. He started noticing that he gets lightheaded at work or areas with similar [employer]. When he started getting these episodes he would go outside away from the light and his lightheadedness would resolve. His heart palpitations started this evening after dinner at rest. He states he had similar symptoms of lightheadedness and heart palpitations a few years ago. At that time cardiac workup was negative and he was told it was due to dehydration. Considering a holter monitor at that time but due to low likelihood this was not done.   Cardiac and neuro exam unremarkable.  Troponin was negative and BMP was unremarkable. Chest xray showed no acute cardiopulmonary disease. EKG was normal sinus rhythm, no change from prior. Orthostatic vitals normal.  Unclear etiology to patient's lightheadedness and heart palpitations. No signs of dehydration. Patient advised to continue good oral fluid intake and if symptoms persist or worsen he may need to see cardiology again for further evaluation. Patient advised to follow up with his PCP and/or return to the ED if symptoms persist/worsen.   Final Clinical Impressions(s) / ED Diagnoses    Final diagnoses:  Palpitations  Lightheadedness    ED Discharge Orders    None       Rehman, Areeg N, DO 10/20/18 0426    Fatima Blank, MD 10/22/18 [phone]

## 2018-10-19 NOTE — ED Triage Notes (Signed)
Reports he feels like his heart is racing after dinner.  Reports it feels okay now.  Noted to be in upper 90's in triage.  Denied any cp or sob.  Also endorses feeling lightheaded at times for the last few weeks.  Reports worse with certain lighting.  Also reports right eye looks different.  Redness noted when he pulls the lower lids down.

## 2018-10-20 NOTE — Discharge Instructions (Signed)
You presented to the ED due to lightheadedness and heart palpitations.  Your work-up was unremarkable and there is no evident cardiac or other cause to these symptoms.  Your orthostatic blood pressures were normal on exam.

## 2020-09-12 ENCOUNTER — Emergency Department (HOSPITAL_COMMUNITY)
Admission: EM | Admit: 2020-09-12 | Discharge: 2020-09-12 | Disposition: A | Discharge status: Home / Self Care | Payer: Managed Care, Other (non HMO) | Attending: Emergency Medicine | Admitting: Emergency Medicine

## 2020-09-12 DIAGNOSIS — R112 Nausea with vomiting, unspecified: Secondary | ICD-10-CM | POA: Diagnosis not present

## 2020-09-12 DIAGNOSIS — R001 Bradycardia, unspecified: Secondary | ICD-10-CM | POA: Insufficient documentation

## 2020-09-12 DIAGNOSIS — R55 Syncope and collapse: Secondary | ICD-10-CM | POA: Insufficient documentation

## 2020-09-12 DIAGNOSIS — F17220 Nicotine dependence, chewing tobacco, uncomplicated: Secondary | ICD-10-CM | POA: Diagnosis not present

## 2020-09-12 DIAGNOSIS — R110 Nausea: Secondary | ICD-10-CM

## 2020-09-12 DIAGNOSIS — R42 Dizziness and giddiness: Secondary | ICD-10-CM | POA: Diagnosis present

## 2020-09-12 LAB — CBC WITH DIFFERENTIAL/PLATELET
Abs Immature Granulocytes: 0.06 10*3/uL (ref 0.00–0.07)
Basophils Absolute: 0 10*3/uL (ref 0.0–0.1)
Basophils Relative: 0 %
Eosinophils Absolute: 0.1 10*3/uL (ref 0.0–0.5)
Eosinophils Relative: 1 %
HCT: 48.3 % (ref 39.0–52.0)
Hemoglobin: 15.6 g/dL (ref 13.0–17.0)
Immature Granulocytes: 1 %
Lymphocytes Relative: 9 %
Lymphs Abs: 1.2 10*3/uL (ref 0.7–4.0)
MCH: 30.8 pg (ref 26.0–34.0)
MCHC: 32.3 g/dL (ref 30.0–36.0)
MCV: 95.5 fL (ref 80.0–100.0)
Monocytes Absolute: 0.7 10*3/uL (ref 0.1–1.0)
Monocytes Relative: 6 %
Neutro Abs: 11.1 10*3/uL — ABNORMAL HIGH (ref 1.7–7.7)
Neutrophils Relative %: 83 %
Platelets: 258 10*3/uL (ref 150–400)
RBC: 5.06 MIL/uL (ref 4.22–5.81)
RDW: 13 % (ref 11.5–15.5)
WBC: 13.3 10*3/uL — ABNORMAL HIGH (ref 4.0–10.5)
nRBC: 0 % (ref 0.0–0.2)

## 2020-09-12 LAB — BASIC METABOLIC PANEL
Anion gap: 10 (ref 5–15)
BUN: 20 mg/dL (ref 6–20)
CO2: 22 mmol/L (ref 22–32)
Calcium: 9.1 mg/dL (ref 8.9–10.3)
Chloride: 107 mmol/L (ref 98–111)
Creatinine, Ser: 1.11 mg/dL (ref 0.61–1.24)
GFR, Estimated: >60 mL/min (ref >60)
Glucose, Bld: 120 mg/dL — ABNORMAL HIGH (ref 70–99)
Potassium: 4 mmol/L (ref 3.5–5.1)
Sodium: 139 mmol/L (ref 135–145)

## 2020-09-12 LAB — TROPONIN I (HIGH SENSITIVITY)
Troponin I (High Sensitivity): 4 ng/L (ref <18)
Troponin I (High Sensitivity): 5 ng/L (ref <18)

## 2020-09-12 LAB — CBG MONITORING, ED: Glucose-Capillary: 129 mg/dL — ABNORMAL HIGH (ref 70–99)

## 2020-09-12 MED ORDER — SODIUM CHLORIDE 0.9 % IV BOLUS
1000.0000 mL | Freq: Once | INTRAVENOUS | Status: AC
  Administered 2020-09-12: 1000 mL via INTRAVENOUS

## 2020-09-12 NOTE — ED Provider Notes (Signed)
[facility] Provider Note   CSN: [id number] Arrival date & time: 09/12/20  5427     History Chief Complaint  Patient presents with  . Dizziness  . Near Syncope    [facility] is a 50 y.o. male.  Patient presents to the emergency department with a chief complaint of lightheadedness and dizziness.  Patient reports that he woke up around midnight, stood up, felt dizzy, and almost passed out.  He states he became nauseated and vomited once.  He denies having any chest pain or shortness of breath.  Denies any abdominal pain.  Denies any other associated symptoms.  States that he works in [employer].  States that he has had similar symptoms in the past, and it was attributed to dehydration.  Patient reports feeling improved now.  The history is provided by the patient. No language interpreter was used.       Past Medical History:  Diagnosis Date  . ACL tear - left knee 2002   Per patient, not repaired   . Umbilical hernia 0623    Patient Active Problem List   Diagnosis Date Noted  . PAC (premature atrial contraction) 06/01/2016  . BMI 35.0-35.9,adult 04/01/2016    Past Surgical History:  Procedure Laterality Date  . HERNIA REPAIR  7628   Umbilical       Family History  Problem Relation Age of Onset  . Diabetes Mother   . Diabetes Maternal Uncle   . Diabetes Maternal Grandfather   . Heart disease Maternal Aunt     Social History   Tobacco Use  . Smoking status: Never Smoker  . Smokeless tobacco: Current User    Types: Snuff  Substance Use Topics  . Alcohol use: No  . Drug use: No    Home Medications Prior to Admission medications   Not on File    Allergies    Patient has no known allergies.  Review of Systems   Review of Systems  All other systems reviewed and are negative.   Physical Exam Updated Vital Signs BP (!) 141/72 (BP Location: Left Arm)   Pulse 71   Temp (!) 97.4 F (36.3 C) (Oral)   Resp  20   Ht 6\' 4"  (1.93 m)   Wt 129.3 kg   SpO2 98%   BMI 34.69 kg/m   Physical Exam Vitals and nursing note reviewed.  Constitutional:      Appearance: He is well-developed.  HENT:     Head: Normocephalic and atraumatic.  Eyes:     Conjunctiva/sclera: Conjunctivae normal.  Cardiovascular:     Rate and Rhythm: Normal rate and regular rhythm.     Heart sounds: No murmur heard.   Pulmonary:     Effort: Pulmonary effort is normal. No respiratory distress.     Breath sounds: Normal breath sounds.  Abdominal:     Palpations: Abdomen is soft.     Tenderness: There is no abdominal tenderness.  Musculoskeletal:        General: Normal range of motion.     Cervical back: Neck supple.  Skin:    General: Skin is warm and dry.  Neurological:     Mental Status: He is alert and oriented to person, place, and time.  Psychiatric:        Mood and Affect: Mood normal.        Behavior: Behavior normal.     ED Results / Procedures / Treatments   Labs (all labs ordered  are listed, but only abnormal results are displayed) Labs Reviewed  CBC WITH DIFFERENTIAL/PLATELET - Abnormal; Notable for the following components:      Result Value   WBC 13.3 (*)    Neutro Abs 11.1 (*)    All other components within normal limits  BASIC METABOLIC PANEL - Abnormal; Notable for the following components:   Glucose, Bld 120 (*)    All other components within normal limits  CBG MONITORING, ED - Abnormal; Notable for the following components:   Glucose-Capillary 129 (*)    All other components within normal limits  TROPONIN I (HIGH SENSITIVITY)  TROPONIN I (HIGH SENSITIVITY)    EKG EKG Interpretation  Date/Time:  Friday September 12 2020 02:24:15 EDT Ventricular Rate:  59 PR Interval:  131 QRS Duration: 97 QT Interval:  441 QTC Calculation: 437 R Axis:   66 Text Interpretation: Sinus rhythm Nonspecific T wave abnormality Confirmed by Lajean Saver [phone]) on 09/12/2020 2:33:56 AM   Radiology No  results found.  Procedures Procedures   Medications Ordered in ED Medications  sodium chloride 0.9 % bolus 1,000 mL (has no administration in time range)    ED Course  I have reviewed the triage vital signs and the nursing notes.  Pertinent labs & imaging results that were available during my care of the patient were reviewed by me and considered in my medical decision making (see chart for details).    MDM Rules/Calculators/A&P                          This patient complains of dizziness, lightheadedness, nausea, this involves an extensive number of treatment options, and is a complaint that carries with it a high risk of complications and morbidity.    Differential Dx Hypoglycemia, orthostatic hypotension, dehydration, vertigo  Pertinent Labs I ordered, reviewed, and interpreted labs, which included nonspecific leukocytosis to 13.3, normal electrolytes, CBG 129, initial trop is 4, repeat is 5.  Doubt ACS.   Medications I ordered medication fluids for dizziness.  Reassessments After the interventions stated above, I reevaluated the patient and found feeling much better.  States that he no longer feels dizzy when he moves his head.  Will give an additional liter of fluid.  Not orthostatic.  Consultants none  Plan Discharge.  Outpatient follow-up.  May also benefit from follow-up with cardiology for bradycardia.     Final Clinical Impression(s) / ED Diagnoses Final diagnoses:  Near syncope  Nausea    Rx / DC Orders ED Discharge Orders    None       Montine Circle, PA-C 09/12/20 0556    Lajean Saver, MD 09/13/20 [phone]

## 2020-09-12 NOTE — ED Notes (Signed)
Pt transported to lobby in wheelchair. Pt verbalized understanding of d/c instructions and follow up care.  Pt d/c at [facility]

## 2020-09-12 NOTE — ED Notes (Signed)
ED Provider at bedside. 

## 2020-09-12 NOTE — ED Notes (Signed)
Ambulatory to bathroom w. Even and steady gait. NAD

## 2020-09-12 NOTE — ED Notes (Signed)
Lab called regarding delay in labs. Stated would go look @ analyzer.

## 2020-09-12 NOTE — ED Triage Notes (Addendum)
EMS from home. Pt awoke this AM becoming extremely dizzy, diaphoretic. Vomited and had near syncopal episode. 12 lead EMS SB w/ PAC. Pt a0 x4.  Given 4mg  Zofran by EMS PTA.

## 2021-04-05 ENCOUNTER — Other Ambulatory Visit (HOSPITAL_COMMUNITY): Payer: Managed Care, Other (non HMO)

## 2021-04-05 ENCOUNTER — Emergency Department (HOSPITAL_BASED_OUTPATIENT_CLINIC_OR_DEPARTMENT_OTHER): Payer: Managed Care, Other (non HMO)

## 2021-04-05 ENCOUNTER — Inpatient Hospital Stay (HOSPITAL_COMMUNITY)
Admission: EM | Admit: 2021-04-05 | Discharge: 2021-04-08 | DRG: 176 | Disposition: A | Discharge status: Home / Self Care | Payer: Managed Care, Other (non HMO) | Attending: Internal Medicine | Admitting: Internal Medicine

## 2021-04-05 ENCOUNTER — Emergency Department (HOSPITAL_COMMUNITY): Payer: Managed Care, Other (non HMO)

## 2021-04-05 ENCOUNTER — Other Ambulatory Visit: Payer: Self-pay

## 2021-04-05 ENCOUNTER — Encounter (HOSPITAL_COMMUNITY): Payer: Self-pay | Admitting: Emergency Medicine

## 2021-04-05 DIAGNOSIS — I272 Pulmonary hypertension, unspecified: Secondary | ICD-10-CM | POA: Diagnosis present

## 2021-04-05 DIAGNOSIS — Z8249 Family history of ischemic heart disease and other diseases of the circulatory system: Secondary | ICD-10-CM

## 2021-04-05 DIAGNOSIS — M7989 Other specified soft tissue disorders: Secondary | ICD-10-CM

## 2021-04-05 DIAGNOSIS — Z8572 Personal history of non-Hodgkin lymphomas: Secondary | ICD-10-CM

## 2021-04-05 DIAGNOSIS — Z833 Family history of diabetes mellitus: Secondary | ICD-10-CM

## 2021-04-05 DIAGNOSIS — I82432 Acute embolism and thrombosis of left popliteal vein: Secondary | ICD-10-CM | POA: Diagnosis not present

## 2021-04-05 DIAGNOSIS — I82412 Acute embolism and thrombosis of left femoral vein: Secondary | ICD-10-CM | POA: Diagnosis present

## 2021-04-05 DIAGNOSIS — Z20822 Contact with and (suspected) exposure to covid-19: Secondary | ICD-10-CM | POA: Diagnosis present

## 2021-04-05 DIAGNOSIS — F17290 Nicotine dependence, other tobacco product, uncomplicated: Secondary | ICD-10-CM | POA: Diagnosis present

## 2021-04-05 DIAGNOSIS — Z6835 Body mass index (BMI) 35.0-35.9, adult: Secondary | ICD-10-CM | POA: Diagnosis not present

## 2021-04-05 DIAGNOSIS — I2699 Other pulmonary embolism without acute cor pulmonale: Secondary | ICD-10-CM | POA: Diagnosis present

## 2021-04-05 DIAGNOSIS — Z86718 Personal history of other venous thrombosis and embolism: Secondary | ICD-10-CM | POA: Insufficient documentation

## 2021-04-05 DIAGNOSIS — I82442 Acute embolism and thrombosis of left tibial vein: Secondary | ICD-10-CM | POA: Diagnosis present

## 2021-04-05 DIAGNOSIS — I2602 Saddle embolus of pulmonary artery with acute cor pulmonale: Secondary | ICD-10-CM

## 2021-04-05 DIAGNOSIS — R609 Edema, unspecified: Secondary | ICD-10-CM | POA: Diagnosis not present

## 2021-04-05 DIAGNOSIS — R778 Other specified abnormalities of plasma proteins: Secondary | ICD-10-CM | POA: Diagnosis present

## 2021-04-05 DIAGNOSIS — Z28310 Unvaccinated for covid-19: Secondary | ICD-10-CM | POA: Diagnosis not present

## 2021-04-05 DIAGNOSIS — E669 Obesity, unspecified: Secondary | ICD-10-CM | POA: Diagnosis present

## 2021-04-05 DIAGNOSIS — Z801 Family history of malignant neoplasm of trachea, bronchus and lung: Secondary | ICD-10-CM

## 2021-04-05 DIAGNOSIS — Z859 Personal history of malignant neoplasm, unspecified: Secondary | ICD-10-CM | POA: Diagnosis not present

## 2021-04-05 LAB — CBC WITH DIFFERENTIAL/PLATELET
Abs Immature Granulocytes: 0.05 10*3/uL (ref 0.00–0.07)
Basophils Absolute: 0 10*3/uL (ref 0.0–0.1)
Basophils Relative: 0 %
Eosinophils Absolute: 0.1 10*3/uL (ref 0.0–0.5)
Eosinophils Relative: 1 %
HCT: 49.8 % (ref 39.0–52.0)
Hemoglobin: 16.3 g/dL (ref 13.0–17.0)
Immature Granulocytes: 1 %
Lymphocytes Relative: 10 %
Lymphs Abs: 1.1 10*3/uL (ref 0.7–4.0)
MCH: 30.4 pg (ref 26.0–34.0)
MCHC: 32.7 g/dL (ref 30.0–36.0)
MCV: 92.7 fL (ref 80.0–100.0)
Monocytes Absolute: 0.7 10*3/uL (ref 0.1–1.0)
Monocytes Relative: 7 %
Neutro Abs: 8.7 10*3/uL — ABNORMAL HIGH (ref 1.7–7.7)
Neutrophils Relative %: 81 %
Platelets: 226 10*3/uL (ref 150–400)
RBC: 5.37 MIL/uL (ref 4.22–5.81)
RDW: 12.7 % (ref 11.5–15.5)
WBC: 10.6 10*3/uL — ABNORMAL HIGH (ref 4.0–10.5)
nRBC: 0 % (ref 0.0–0.2)

## 2021-04-05 LAB — COMPREHENSIVE METABOLIC PANEL
ALT: 22 U/L (ref 0–44)
AST: 18 U/L (ref 15–41)
Albumin: 3.9 g/dL (ref 3.5–5.0)
Alkaline Phosphatase: 59 U/L (ref 38–126)
Anion gap: 6 (ref 5–15)
BUN: 14 mg/dL (ref 6–20)
CO2: 25 mmol/L (ref 22–32)
Calcium: 8.9 mg/dL (ref 8.9–10.3)
Chloride: 107 mmol/L (ref 98–111)
Creatinine, Ser: 1.21 mg/dL (ref 0.61–1.24)
GFR, Estimated: >60 mL/min (ref >60)
Glucose, Bld: 129 mg/dL — ABNORMAL HIGH (ref 70–99)
Potassium: 4.1 mmol/L (ref 3.5–5.1)
Sodium: 138 mmol/L (ref 135–145)
Total Bilirubin: 1.1 mg/dL (ref 0.3–1.2)
Total Protein: 6.8 g/dL (ref 6.5–8.1)

## 2021-04-05 LAB — ECHOCARDIOGRAM COMPLETE
AR max vel: 1.31 cm2
AV Peak grad: 52.4 mmHg
Ao pk vel: 3.62 m/s
Area-P 1/2: 5.13 cm2
Calc EF: 63 %
Height: 76 in
S' Lateral: 3.5 cm
Single Plane A2C EF: 71.4 %
Single Plane A4C EF: 53 %
Weight: 4720 oz

## 2021-04-05 LAB — RESP PANEL BY RT-PCR (FLU A&B, COVID) ARPGX2
Influenza A by PCR: NEGATIVE
Influenza B by PCR: NEGATIVE
SARS Coronavirus 2 by RT PCR: NEGATIVE

## 2021-04-05 LAB — BRAIN NATRIURETIC PEPTIDE: B Natriuretic Peptide: 34.5 pg/mL (ref 0.0–100.0)

## 2021-04-05 LAB — ANTITHROMBIN III: AntiThromb III Func: 95 % (ref 75–120)

## 2021-04-05 LAB — PROTIME-INR
INR: 1.3 — ABNORMAL HIGH (ref 0.8–1.2)
Prothrombin Time: 15.7 seconds — ABNORMAL HIGH (ref 11.4–15.2)

## 2021-04-05 LAB — TROPONIN I (HIGH SENSITIVITY)
Troponin I (High Sensitivity): 50 ng/L — ABNORMAL HIGH (ref <18)
Troponin I (High Sensitivity): 194 ng/L (ref <18)

## 2021-04-05 LAB — D-DIMER, QUANTITATIVE: D-Dimer, Quant: 17.35 ug/mL-FEU — ABNORMAL HIGH (ref 0.00–0.50)

## 2021-04-05 LAB — LACTIC ACID, PLASMA: Lactic Acid, Venous: 0.8 mmol/L (ref 0.5–1.9)

## 2021-04-05 LAB — HEPARIN LEVEL (UNFRACTIONATED): Heparin Unfractionated: 0.53 IU/mL (ref 0.30–0.70)

## 2021-04-05 MED ORDER — HEPARIN BOLUS VIA INFUSION
7500.0000 [IU] | Freq: Once | INTRAVENOUS | Status: AC
  Administered 2021-04-05: 7500 [IU] via INTRAVENOUS
  Filled 2021-04-05: qty 7500

## 2021-04-05 MED ORDER — IOHEXOL 350 MG/ML SOLN
100.0000 mL | Freq: Once | INTRAVENOUS | Status: AC | PRN
  Administered 2021-04-05: 100 mL via INTRAVENOUS

## 2021-04-05 MED ORDER — ONDANSETRON HCL 4 MG/2ML IJ SOLN: 4.0000 mg | Freq: Four times a day (QID) | INTRAMUSCULAR | Status: DC | PRN

## 2021-04-05 MED ORDER — SODIUM CHLORIDE 0.9 % IV BOLUS
1000.0000 mL | Freq: Once | INTRAVENOUS | Status: AC
  Administered 2021-04-05: 1000 mL via INTRAVENOUS

## 2021-04-05 MED ORDER — HEPARIN (PORCINE) 25000 UT/250ML-% IV SOLN
2200.0000 [IU]/h | INTRAVENOUS | Status: DC
  Administered 2021-04-05 (×2): 2000 [IU]/h via INTRAVENOUS
  Administered 2021-04-06: 2200 [IU]/h via INTRAVENOUS
  Administered 2021-04-06: 2150 [IU]/h via INTRAVENOUS
  Administered 2021-04-07 – 2021-04-08 (×3): 2200 [IU]/h via INTRAVENOUS
  Filled 2021-04-05 (×7): qty 250

## 2021-04-05 MED ORDER — ACETAMINOPHEN 650 MG RE SUPP: 650.0000 mg | Freq: Four times a day (QID) | RECTAL | Status: DC | PRN

## 2021-04-05 MED ORDER — ONDANSETRON HCL 4 MG PO TABS: 4.0000 mg | ORAL_TABLET | Freq: Four times a day (QID) | ORAL | Status: DC | PRN

## 2021-04-05 MED ORDER — ACETAMINOPHEN 325 MG PO TABS: 650.0000 mg | ORAL_TABLET | Freq: Four times a day (QID) | ORAL | Status: DC | PRN

## 2021-04-05 NOTE — Progress Notes (Signed)
Lower extremity venous has been completed.   Preliminary results in CV Proc.   [facility] 04/05/2021 9:32 AM

## 2021-04-05 NOTE — ED Provider Notes (Signed)
[place] EMERGENCY DEPARTMENT Provider Note   CSN: [id number] Arrival date & time: 04/05/21  1607     History No chief complaint on file.   James Jordan is a 50 y.o. male.  Pt presents to the ED today with sob and left leg swelling.  Pt said he has had some swelling to his left leg for the past few days.  Today, he felt sob and felt like his heart was racing.  He feels more sob with ambulation.  Pt has no hx of PE or DVT.  No hx clotting d/o.  No recent surgery.  No recent long trips. He is not on blood thinners.  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  No f/c.       Past Medical History:  Diagnosis Date   ACL tear - left knee 2002   Per patient, not repaired    Umbilical hernia 3710    Patient Active Problem List   Diagnosis Date Noted   PAC (premature atrial contraction) 06/01/2016   BMI 35.0-35.9,adult 04/01/2016    Past Surgical History:  Procedure Laterality Date   HERNIA REPAIR  6269   Umbilical       Family History  Problem Relation Age of Onset   Diabetes Mother    Diabetes Maternal Grandfather    Lung cancer Paternal Grandmother    Lung cancer Paternal Grandfather    Heart disease Maternal Aunt    Diabetes Maternal Uncle     Social History   Tobacco Use   Smoking status: Never   Smokeless tobacco: Current    Types: Snuff  Substance Use Topics   Alcohol use: No   Drug use: No    Home Medications Prior to Admission medications   Not on File    Allergies    Patient has no known allergies.  Review of Systems   Review of Systems  Respiratory:  Positive for shortness of breath.   Cardiovascular:  Positive for palpitations.  Musculoskeletal:        Left leg pain  All other systems reviewed and are negative.  Physical Exam Updated Vital Signs BP (!) 149/88   Pulse 82   Temp 97.8 F (36.6 C) (Oral)   Resp 18   Ht 6\' 4"  (1.93 m)   Wt 133.8 kg   SpO2 94%   BMI 35.91 kg/m    Physical Exam Vitals and nursing note reviewed.  Constitutional:      Appearance: Normal appearance.  HENT:     Head: Normocephalic and atraumatic.     Right Ear: External ear normal.     Left Ear: External ear normal.     Nose: Nose normal.     Mouth/Throat:     Mouth: Mucous membranes are moist.     Pharynx: Oropharynx is clear.  Eyes:     Extraocular Movements: Extraocular movements intact.     Conjunctiva/sclera: Conjunctivae normal.     Pupils: Pupils are equal, round, and reactive to light.  Cardiovascular:     Rate and Rhythm: Regular rhythm. Tachycardia present.     Pulses: Normal pulses.     Heart sounds: Normal heart sounds.  Pulmonary:     Effort: Pulmonary effort is normal.     Breath sounds: Normal breath sounds.  Abdominal:     General: Abdomen is flat. Bowel sounds are normal.     Palpations: Abdomen is soft.  Musculoskeletal:  General: Normal range of motion.     Cervical back: Normal range of motion and neck supple.     Comments: LLE swollen  Skin:    General: Skin is warm.     Capillary Refill: Capillary refill takes less than 2 seconds.  Neurological:     General: No focal deficit present.     Mental Status: He is alert and oriented to person, place, and time.  Psychiatric:        Mood and Affect: Mood normal.        Behavior: Behavior normal.    ED Results / Procedures / Treatments   Labs (all labs ordered are listed, but only abnormal results are displayed) Labs Reviewed  CBC WITH DIFFERENTIAL/PLATELET - Abnormal; Notable for the following components:      Result Value   WBC 10.6 (*)    Neutro Abs 8.7 (*)    All other components within normal limits  COMPREHENSIVE METABOLIC PANEL - Abnormal; Notable for the following components:   Glucose, Bld 129 (*)    All other components within normal limits  D-DIMER, QUANTITATIVE - Abnormal; Notable for the following components:   D-Dimer, Quant 17.35 (*)    All other components within normal  limits  PROTIME-INR - Abnormal; Notable for the following components:   Prothrombin Time 15.7 (*)    INR 1.3 (*)    All other components within normal limits  TROPONIN I (HIGH SENSITIVITY) - Abnormal; Notable for the following components:   Troponin I (High Sensitivity) 50 (*)    All other components within normal limits  TROPONIN I (HIGH SENSITIVITY) - Abnormal; Notable for the following components:   Troponin I (High Sensitivity) 194 (*)    All other components within normal limits  RESP PANEL BY RT-PCR (FLU A&B, COVID) ARPGX2  ANTITHROMBIN III  BRAIN NATRIURETIC PEPTIDE  HEPARIN LEVEL (UNFRACTIONATED)  PROTEIN C ACTIVITY  PROTEIN C, TOTAL  PROTEIN S ACTIVITY  PROTEIN S, TOTAL  LUPUS ANTICOAGULANT PANEL  BETA-2-GLYCOPROTEIN I ABS, IGG/M/A  HOMOCYSTEINE  FACTOR 5 LEIDEN  PROTHROMBIN GENE MUTATION  CARDIOLIPIN ANTIBODIES, IGG, IGM, IGA  LACTIC ACID, PLASMA  LACTIC ACID, PLASMA    EKG EKG Interpretation  Date/Time:  Sunday April 05 2021 08:16:00 EDT Ventricular Rate:  106 PR Interval:  146 QRS Duration: 88 QT Interval:  348 QTC Calculation: 462 R Axis:   77 Text Interpretation: Sinus tachycardia Otherwise normal ECG Since last tracing rate faster Confirmed by Isla Pence [phone]) on 04/05/2021 12:18:30 PM  Radiology CT Angio Chest PE W and/or Wo Contrast  Result Date: 04/05/2021 CLINICAL DATA:  Shortness of breath with tachycardia on awakening today. Intermittent left calf pain for 3 days with swelling. Clinical concern for pulmonary embolism. EXAM: CT ANGIOGRAPHY CHEST WITH CONTRAST TECHNIQUE: Multidetector CT imaging of the chest was performed using the standard protocol during bolus administration of intravenous contrast. Multiplanar CT image reconstructions and MIPs were obtained to evaluate the vascular anatomy. CONTRAST:  169mL OMNIPAQUE IOHEXOL 350 MG/ML SOLN COMPARISON:  Chest radiographs 10/19/2018. FINDINGS: Cardiovascular: The pulmonary arteries are well  opacified with contrast to the level of the subsegmental branches. Study is positive for extensive acute pulmonary thromboembolic disease bilaterally with nearly occlusive lobar and segmental branch emboli bilaterally. There is occlusive thrombus within the right lower lobe segmental and subsegmental branches. No evidence of thrombus within the main pulmonary artery, right ventricle or right atrium. No significant systemic arterial abnormalities are identified. There is possible right heart strain with dilatation of  the right ventricle relative to the right atrium (4.3 versus 3.9 cm). The heart size is normal. There is no pericardial effusion. Mediastinum/Nodes: There are no enlarged mediastinal, hilar or axillary lymph nodes. The thyroid gland, trachea and esophagus demonstrate no significant findings. Lungs/Pleura: No pleural effusion or pneumothorax. The lungs are essentially clear, without evidence of pulmonary infarct. There is minimal atelectasis in the lingula. Upper abdomen: Hepatic low density consistent with steatosis. No focal or acute abnormality identified. Probable small cyst anteriorly in the mid right kidney. Musculoskeletal/Chest wall: There is no chest wall mass or suspicious osseous finding. Review of the MIP images confirms the above findings. IMPRESSION: 1. Large volume of pulmonary emboli in the lungs bilaterally, with dilatation of the right atrium and right ventricle (RV to LV ratio of 1.1) indicative of elevated right-sided heart pressures and right heart strain. These findings have been shown to be associated with a increased morbidity and mortality in the setting pulmonary embolism. 2. No evidence of pulmonary infarct or significant pleural effusion. 3. Hepatic steatosis. 4. Critical Value/emergent results were called by telephone at the time of interpretation on 04/05/2021 at 1:15 pm to provider Izetta Sakamoto , who verbally acknowledged these results. Electronically Signed   By: Richardean Sale M.D.   On: 04/05/2021 13:21   ECHOCARDIOGRAM COMPLETE  Result Date: 04/05/2021    ECHOCARDIOGRAM REPORT   Patient Name:   James Jordan Date of Exam: 04/05/2021 Medical Rec #:  [id number]        Height:       76.0 in Accession #:    [id number]       Weight:       295.0 lb Date of Birth:  Feb 16, 1971        BSA:          2.613 m Patient Age:    43 years         BP:           149/96 mmHg Patient Gender: M                HR:           100 bpm. Exam Location:  Inpatient Procedure: 2D Echo, 3D Echo, Cardiac Doppler and Color Doppler                STAT ECHO  Reported to Dr. Gilford Raid and Dr. Carlis Abbott. Indications:    I26.02 Pulmonary embolus  History:        Patient has no prior history of Echocardiogram examinations.                 Abnormal ECG, Arrythmias:PAC; Signs/Symptoms:Shortness of Breath                 and Dyspnea.  Sonographer:    Roseanna Rainbow RDCS Referring Phys: [id number] GRACE E BOWSER  Sonographer Comments: Technically difficult study due to poor echo windows. Image acquisition challenging due to patient body habitus. IMPRESSIONS  1. Left ventricular ejection fraction, by estimation, is 55 to 60%. The left ventricle has normal function. The left ventricle has no regional wall motion abnormalities. Left ventricular diastolic parameters were normal.  2. Right ventricular systolic function is normal. The right ventricular size is normal. There is severely elevated pulmonary artery systolic pressure. The estimated right ventricular systolic pressure is 95.2 mmHg.  3. Right atrial size was mildly dilated.  4. The mitral valve is normal in structure. Trivial mitral valve regurgitation. No evidence of mitral stenosis.  5. The aortic valve is grossly normal. Aortic valve regurgitation is not visualized. No aortic stenosis is present.  6. The inferior vena cava is dilated in size with >50% respiratory variability, suggesting right atrial pressure of 8 mmHg. Comparison(s): No prior Echocardiogram.  Conclusion(s)/Recommendation(s): Severely elevated pulmonary pressures, with preservation of RV size/function. FINDINGS  Left Ventricle: Left ventricular ejection fraction, by estimation, is 55 to 60%. The left ventricle has normal function. The left ventricle has no regional wall motion abnormalities. The left ventricular internal cavity size was normal in size. There is  borderline left ventricular hypertrophy. Left ventricular diastolic parameters were normal. Right Ventricle: The right ventricular size is normal. No increase in right ventricular wall thickness. Right ventricular systolic function is normal. There is severely elevated pulmonary artery systolic pressure. The tricuspid regurgitant velocity is 3.94 m/s, and with an assumed right atrial pressure of 8 mmHg, the estimated right ventricular systolic pressure is 01.7 mmHg. Left Atrium: Left atrial size was normal in size. Right Atrium: Right atrial size was mildly dilated. Pericardium: There is no evidence of pericardial effusion. Mitral Valve: The mitral valve is normal in structure. Trivial mitral valve regurgitation. No evidence of mitral valve stenosis. Tricuspid Valve: The tricuspid valve is normal in structure. Tricuspid valve regurgitation is mild . No evidence of tricuspid stenosis. Aortic Valve: The aortic valve is grossly normal. Aortic valve regurgitation is not visualized. No aortic stenosis is present. Aortic valve peak gradient measures 52.4 mmHg. Pulmonic Valve: The pulmonic valve was not well visualized. Pulmonic valve regurgitation is not visualized. No evidence of pulmonic stenosis. Aorta: The aortic root, ascending aorta and aortic arch are all structurally normal, with no evidence of dilitation or obstruction. Venous: The inferior vena cava is dilated in size with greater than 50% respiratory variability, suggesting right atrial pressure of 8 mmHg. IAS/Shunts: The atrial septum is grossly normal.  LEFT VENTRICLE PLAX 2D LVIDd:          5.00 cm      Diastology LVIDs:         3.50 cm      LV e' medial:    12.90 cm/s LV PW:         1.20 cm      LV E/e' medial:  4.9 LV IVS:        0.90 cm      LV e' lateral:   15.20 cm/s LVOT diam:     2.40 cm      LV E/e' lateral: 4.2 LV SV:         76 LV SV Index:   29 LVOT Area:     4.52 cm                              3D Volume EF: LV Volumes (MOD)            3D EF:        56 % LV vol d, MOD A2C: 118.0 ml LV EDV:       276 ml LV vol d, MOD A4C: 82.5 ml  LV ESV:       121 ml LV vol s, MOD A2C: 33.7 ml  LV SV:        155 ml LV vol s, MOD A4C: 38.8 ml LV SV MOD A2C:     84.3 ml LV SV MOD A4C:     82.5 ml LV SV MOD BP:  62.5 ml RIGHT VENTRICLE             IVC RV S prime:     15.10 cm/s  IVC diam: 2.60 cm TAPSE (M-mode): 1.8 cm LEFT ATRIUM             Index        RIGHT ATRIUM           Index LA diam:        3.60 cm 1.38 cm/m   RA Area:     22.10 cm LA Vol (A2C):   43.2 ml 16.53 ml/m  RA Volume:   64.00 ml  24.49 ml/m LA Vol (A4C):   29.4 ml 11.25 ml/m LA Biplane Vol: 35.4 ml 13.55 ml/m  AORTIC VALVE AV Area (Vmax): 1.31 cm AV Vmax:        362.00 cm/s AV Peak Grad:   52.4 mmHg LVOT Vmax:      105.00 cm/s LVOT Vmean:     69.700 cm/s LVOT VTI:       0.169 m  AORTA Ao Root diam: 3.60 cm Ao Asc diam:  3.60 cm MITRAL VALVE               TRICUSPID VALVE MV Area (PHT): 5.13 cm    TR Peak grad:   62.1 mmHg MV Decel Time: 148 msec    TR Vmax:        394.00 cm/s MV E velocity: 63.20 cm/s MV A velocity: 70.60 cm/s  SHUNTS MV E/A ratio:  0.90        Systemic VTI:  0.17 m                            Systemic Diam: 2.40 cm Buford Dresser MD Electronically signed by Buford Dresser MD Signature Date/Time: 04/05/2021/3:24:21 PM    Final    VAS [place] LOWER EXTREMITY VENOUS (DVT) (ONLY [facility] & [facility])  Result Date: 04/05/2021  Lower Venous DVT Study Patient Name:  James Jordan  Date of Exam:   04/05/2021 Medical Rec #: [id number]         Accession #:    [id number] Date of Birth: Oct 27, 1970         Patient Gender: M  Patient Age:   65 years Exam Location:  [facility] Procedure:      VAS [place] LOWER EXTREMITY VENOUS (DVT) Referring Phys: RILEY RANSOM --------------------------------------------------------------------------------  Indications: Swelling, and Edema.  Performing Technologist: Archie Patten RVS  Examination Guidelines: A complete evaluation includes B-mode imaging, spectral Doppler, color Doppler, and power Doppler as needed of all accessible portions of each vessel. Bilateral testing is considered an integral part of a complete examination. Limited examinations for reoccurring indications may be performed as noted. The reflux portion of the exam is performed with the patient in reverse Trendelenburg.  +-----+---------------+---------+-----------+----------+--------------+ RIGHTCompressibilityPhasicitySpontaneityPropertiesThrombus Aging +-----+---------------+---------+-----------+----------+--------------+ CFV  Full           Yes      Yes                                 +-----+---------------+---------+-----------+----------+--------------+   +---------+---------------+---------+-----------+----------+-----------------+ LEFT     CompressibilityPhasicitySpontaneityPropertiesThrombus Aging    +---------+---------------+---------+-----------+----------+-----------------+ CFV      Full           Yes      Yes                                    +---------+---------------+---------+-----------+----------+-----------------+  SFJ      Full                                                           +---------+---------------+---------+-----------+----------+-----------------+ FV Prox  Full                                                           +---------+---------------+---------+-----------+----------+-----------------+ FV Mid   Full                                                           +---------+---------------+---------+-----------+----------+-----------------+  FV DistalNone                                         Age Indeterminate +---------+---------------+---------+-----------+----------+-----------------+ PFV      Full                                                           +---------+---------------+---------+-----------+----------+-----------------+ POP      None           No       No                   Acute             +---------+---------------+---------+-----------+----------+-----------------+ PTV      None                                         Age Indeterminate +---------+---------------+---------+-----------+----------+-----------------+ PERO     Partial                                      Age Indeterminate +---------+---------------+---------+-----------+----------+-----------------+     Summary: RIGHT: - No evidence of common femoral vein obstruction.  LEFT: - Findings consistent with acute deep vein thrombosis involving the left popliteal vein. - Findings consistent with age indeterminate deep vein thrombosis involving the left femoral vein, left peroneal veins, and left posterior tibial veins.  *See table(s) above for measurements and observations. Electronically signed by Deitra Mayo MD on 04/05/2021 at 1:31:34 PM.    Final     Procedures Procedures   Medications Ordered in ED Medications  heparin ADULT infusion 100 units/mL (25000 units/256mL) (2,000 Units/hr Intravenous New Bag/Given 04/05/21 1320)  sodium chloride 0.9 % bolus 1,000 mL (0 mLs Intravenous Stopped 04/05/21 1305)  heparin bolus via infusion 7,500 Units (7,500 Units Intravenous Bolus from Bag 04/05/21 1321)  iohexol (OMNIPAQUE) 350 MG/ML injection 100 mL (100 mLs Intravenous  Contrast Given 04/05/21 1259)    ED Course  I have reviewed the triage vital signs and the nursing notes.  Pertinent labs & imaging results that were available during my care of the patient were reviewed by me and considered in my medical decision making  (see chart for details).    MDM Rules/Calculators/A&P                           [place] result + for DVT.  Pt started on heparin and hypercoagulable profile ordered.    CTA does show bilateral PE with possible heart strain.    Pt d/w CCM who ordered an ECHO.  He has severe pulmonary htn, but RV is normal. CCM feels pulmonary htn is likely from a chronic issue; likely sleep apnea.  They feel pt does not meet criteria for tpa and can go to the floor. Pt has no contraindications for tpa, so if he worsens; he can get it.  Pt is d/w Dr. Roosevelt Locks (triad) for admission.  CRITICAL CARE Performed by: Isla Pence   Total critical care time: 30 minutes  Critical care time was exclusive of separately billable procedures and treating other patients.  Critical care was necessary to treat or prevent imminent or life-threatening deterioration.  Critical care was time spent personally by me on the following activities: development of treatment plan with patient and/or surrogate as well as nursing, discussions with consultants, evaluation of patient's response to treatment, examination of patient, obtaining history from patient or surrogate, ordering and performing treatments and interventions, ordering and review of laboratory studies, ordering and review of radiographic studies, pulse oximetry and re-evaluation of patient's condition.   Final Clinical Impression(s) / ED Diagnoses Final diagnoses:  Acute deep vein thrombosis (DVT) of popliteal vein of left lower extremity ([place])  Bilateral pulmonary embolism ([place])    Rx / DC Orders ED Discharge Orders     None        Isla Pence, MD 04/05/21 1617

## 2021-04-05 NOTE — ED Triage Notes (Signed)
Pt reports SOB and heart racing when he bent over and stood back up this morning.  Denies symptoms at present.  Reports intermittent L calf pain x 3 days and L lower leg swelling since yesterday.  No pain at present.

## 2021-04-05 NOTE — Progress Notes (Signed)
ANTICOAGULATION CONSULT NOTE - Initial Consult  Pharmacy Consult for Heparin Indication:  VTE Treatment  No Known Allergies  Patient Measurements: Height: 6\' 4"  (193 cm) Weight: 133.8 kg (295 lb) IBW/kg (Calculated) : 86.8 Heparin Dosing Weight: 116.1 kg  Vital Signs: BP: 132/83 (10/23 2000) Pulse Rate: 79 (10/23 2000)  Labs: Recent Labs    04/05/21 0829 04/05/21 1144 04/05/21 1319 04/05/21 1900  HGB 16.3  --   --   --   HCT 49.8  --   --   --   PLT 226  --   --   --   LABPROT  --  15.7*  --   --   INR  --  1.3*  --   --   HEPARINUNFRC  --   --   --  0.53  CREATININE 1.21  --   --   --   TROPONINIHS 50*  --  194*  --      Estimated Creatinine Clearance: 111.5 mL/min (by C-G formula based on SCr of 1.21 mg/dL).   Medical History: Past Medical History:  Diagnosis Date   ACL tear - left knee 2002   Per patient, not repaired    Umbilical hernia 2595    Medications:  (Not in a hospital admission)  Scheduled:  Infusions:   heparin 2,000 Units/hr (04/05/21 1320)   PRN:   Assessment: 34 yom presenting with shortness of breath and palpitations this morning. Patient denies history of PE/DVTs. Heparin per pharmacy consult placed for  VTE Treatment . Patient is on not on anticoagulation prior to arrival.  [place] this morning with findings of DVT. CT confirmed submassive bilateral PE w/o RV strain  Patient given 7500 unit bolus and started on 2000 units/hr infusion of heparin. Heparin level this evening back at 0.53 which is therapeutic.  Hgb 16.3; plt 226 D-Dimer 17.35  Goal of Therapy:  Heparin level 0.3-0.7 units/ml Monitor platelets by anticoagulation protocol: Yes   Plan:  Continue heparin infusion at 2000 units/hr - will confirm level in 6 hours Check anti-Xa level in 6 hours and daily while on heparin Continue to monitor H&H and platelets Follow up plan for [facility] prior to discharge - will need education  Lorelei Pont, PharmD, BCPS 04/05/2021 8:23  PM ED Clinical Pharmacist -  [phone]

## 2021-04-05 NOTE — Progress Notes (Signed)
  Echocardiogram 2D Echocardiogram has been performed.  Bobbye Charleston 04/05/2021, 3:28 PM

## 2021-04-05 NOTE — H&P (Signed)
History and Physical    James CHUONG QMG:[id number] DOB: Aug 03, 1970 DOA: 04/05/2021  PCP: Pcp, No (Confirm with patient/family/NH records and if not entered, this has to be entered at [facility] point of entry) Patient coming from: Home  I have personally briefly reviewed patient's old medical records in [place]  Chief Complaint: SOB and left leg swelling and pain  HPI: James Jordan is a 50 y.o. 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  His symptoms started about 3-4 days ago, when he started to have left calf cramping like pain, and then woke up next day with left calf swelling and pain became more frequent. Today, he woke up with strong feeling of palpitations, and shortness of breath, no cough, no chest pain.  ED Course: Borderline tachycardia, no hypotension, CT angiogram positive for large volume of pulmonary emboli bilateral.  RV: LV ratio 1.1 implying for right heart strain, DVT study positive for left leg DVT femoral, peroneal and tibial vein.  Bedside echocardiogram showed normal RV size with severely elevated pulmonary artery pressure to 70 mmHg.  Heparin drip started, pulmonary critical care consulted recommend non-tPA protocol.  Review of Systems: As per HPI otherwise 14 point review of systems negative.    Past Medical History:  Diagnosis Date   ACL tear - left knee 2002   Per patient, not repaired    Umbilical hernia 8916    Past Surgical History:  Procedure Laterality Date   HERNIA REPAIR  9450   Umbilical     reports that he has never smoked. His smokeless tobacco use includes snuff. He reports that he does not drink alcohol and does not use drugs.  No Known Allergies  Family History  Problem Relation Age of Onset   Diabetes Mother    Diabetes Maternal Grandfather    Lung cancer  Paternal Grandmother    Lung cancer Paternal Grandfather    Heart disease Maternal Aunt    Diabetes Maternal Uncle      Prior to Admission medications   Not on File    Physical Exam: Vitals:   04/05/21 1530 04/05/21 1545 04/05/21 1600 04/05/21 1615  BP: (!) 147/86 (!) 149/88 (!) 145/78 132/84  Pulse: 83 82 86 79  Resp: 16 18 20 15   Temp:      TempSrc:      SpO2: 94% 94% 92% 94%  Weight:      Height:        Constitutional: NAD, calm, comfortable Vitals:   04/05/21 1530 04/05/21 1545 04/05/21 1600 04/05/21 1615  BP: (!) 147/86 (!) 149/88 (!) 145/78 132/84  Pulse: 83 82 86 79  Resp: 16 18 20 15   Temp:      TempSrc:      SpO2: 94% 94% 92% 94%  Weight:      Height:       Eyes: PERRL, lids and conjunctivae normal ENMT: Mucous membranes are moist. Posterior pharynx clear of any exudate or lesions.Normal dentition.  Neck: normal, supple, no masses, no thyromegaly Respiratory: clear to auscultation bilaterally, no wheezing, no crackles. Normal respiratory effort. No accessory muscle use.  Cardiovascular: Regular rate and rhythm, no murmurs / rubs / gallops.  Left calf swelling tenderness. 2+ pedal pulses. No carotid bruits.  Abdomen: no tenderness, no masses palpated. No hepatosplenomegaly. Bowel sounds positive.  Musculoskeletal: no clubbing / cyanosis. No joint deformity upper and lower extremities. Good ROM, no contractures. Normal muscle tone.  Skin: no rashes, lesions, ulcers. No induration Neurologic: CN 2-12 grossly intact. Sensation intact, DTR normal. Strength 5/5 in all 4.  Psychiatric: Normal judgment and insight. Alert and oriented x 3. Normal mood.     Labs on Admission: I have personally reviewed following labs and imaging studies  CBC: Recent Labs  Lab 04/05/21 0829  WBC 10.6*  NEUTROABS 8.7*  HGB 16.3  HCT 49.8  MCV 92.7  PLT 063   Basic Metabolic Panel: Recent Labs  Lab 04/05/21 0829  NA 138  K 4.1  CL 107  CO2 25  GLUCOSE 129*  BUN 14   CREATININE 1.21  CALCIUM 8.9   GFR: Estimated Creatinine Clearance: 111.5 mL/min (by C-G formula based on SCr of 1.21 mg/dL). Liver Function Tests: Recent Labs  Lab 04/05/21 0829  AST 18  ALT 22  ALKPHOS 59  BILITOT 1.1  PROT 6.8  ALBUMIN 3.9   No results for input(s): LIPASE, AMYLASE in the last 168 hours. No results for input(s): AMMONIA in the last 168 hours. Coagulation Profile: Recent Labs  Lab 04/05/21 1144  INR 1.3*   Cardiac Enzymes: No results for input(s): CKTOTAL, CKMB, CKMBINDEX, TROPONINI in the last 168 hours. BNP (last 3 results) No results for input(s): PROBNP in the last 8760 hours. HbA1C: No results for input(s): HGBA1C in the last 72 hours. CBG: No results for input(s): GLUCAP in the last 168 hours. Lipid Profile: No results for input(s): CHOL, HDL, LDLCALC, TRIG, CHOLHDL, LDLDIRECT in the last 72 hours. Thyroid Function Tests: No results for input(s): TSH, T4TOTAL, FREET4, T3FREE, THYROIDAB in the last 72 hours. Anemia Panel: No results for input(s): VITAMINB12, FOLATE, FERRITIN, TIBC, IRON, RETICCTPCT in the last 72 hours. Urine analysis:    Component Value Date/Time   BILIRUBINUR negative 04/01/2016 1310   KETONESUR negative 04/01/2016 1310   PROTEINUR negative 04/01/2016 1310   UROBILINOGEN 0.2 04/01/2016 1310   NITRITE Negative 04/01/2016 1310   LEUKOCYTESUR Negative 04/01/2016 1310    Radiological Exams on Admission: CT Angio Chest PE W and/or Wo Contrast  Result Date: 04/05/2021 CLINICAL DATA:  Shortness of breath with tachycardia on awakening today. Intermittent left calf pain for 3 days with swelling. Clinical concern for pulmonary embolism. EXAM: CT ANGIOGRAPHY CHEST WITH CONTRAST TECHNIQUE: Multidetector CT imaging of the chest was performed using the standard protocol during bolus administration of intravenous contrast. Multiplanar CT image reconstructions and MIPs were obtained to evaluate the vascular anatomy. CONTRAST:  136mL  OMNIPAQUE IOHEXOL 350 MG/ML SOLN COMPARISON:  Chest radiographs 10/19/2018. FINDINGS: Cardiovascular: The pulmonary arteries are well opacified with contrast to the level of the subsegmental branches. Study is positive for extensive acute pulmonary thromboembolic disease bilaterally with nearly occlusive lobar and segmental branch emboli bilaterally. There is occlusive thrombus within the right lower lobe segmental and subsegmental branches. No evidence of thrombus within the main pulmonary artery, right ventricle or right atrium. No significant systemic arterial abnormalities are identified. There is possible right heart strain with dilatation of the right ventricle relative to the right atrium (4.3 versus 3.9 cm). The heart size is normal. There is no pericardial effusion. Mediastinum/Nodes: There are no enlarged mediastinal, hilar or axillary lymph nodes. The thyroid gland, trachea and esophagus demonstrate no significant findings. Lungs/Pleura: No pleural effusion or pneumothorax. The lungs are essentially clear, without evidence of pulmonary infarct. There is minimal atelectasis in the lingula. Upper  abdomen: Hepatic low density consistent with steatosis. No focal or acute abnormality identified. Probable small cyst anteriorly in the mid right kidney. Musculoskeletal/Chest wall: There is no chest wall mass or suspicious osseous finding. Review of the MIP images confirms the above findings. IMPRESSION: 1. Large volume of pulmonary emboli in the lungs bilaterally, with dilatation of the right atrium and right ventricle (RV to LV ratio of 1.1) indicative of elevated right-sided heart pressures and right heart strain. These findings have been shown to be associated with a increased morbidity and mortality in the setting pulmonary embolism. 2. No evidence of pulmonary infarct or significant pleural effusion. 3. Hepatic steatosis. 4. Critical Value/emergent results were called by telephone at the time of  interpretation on 04/05/2021 at 1:15 pm to provider JULIE HAVILAND , who verbally acknowledged these results. Electronically Signed   By: Richardean Sale M.D.   On: 04/05/2021 13:21   ECHOCARDIOGRAM COMPLETE  Result Date: 04/05/2021    ECHOCARDIOGRAM REPORT   Patient Name:   KAGAN MUTCHLER Date of Exam: 04/05/2021 Medical Rec #:  [id number]        Height:       76.0 in Accession #:    [id number]       Weight:       295.0 lb Date of Birth:  July 04, 1970        BSA:          2.613 m Patient Age:    26 years         BP:           149/96 mmHg Patient Gender: M                HR:           100 bpm. Exam Location:  Inpatient Procedure: 2D Echo, 3D Echo, Cardiac Doppler and Color Doppler                STAT ECHO  Reported to Dr. Gilford Raid and Dr. Carlis Abbott. Indications:    I26.02 Pulmonary embolus  History:        Patient has no prior history of Echocardiogram examinations.                 Abnormal ECG, Arrythmias:PAC; Signs/Symptoms:Shortness of Breath                 and Dyspnea.  Sonographer:    Roseanna Rainbow RDCS Referring Phys: [id number] GRACE E BOWSER  Sonographer Comments: Technically difficult study due to poor echo windows. Image acquisition challenging due to patient body habitus. IMPRESSIONS  1. Left ventricular ejection fraction, by estimation, is 55 to 60%. The left ventricle has normal function. The left ventricle has no regional wall motion abnormalities. Left ventricular diastolic parameters were normal.  2. Right ventricular systolic function is normal. The right ventricular size is normal. There is severely elevated pulmonary artery systolic pressure. The estimated right ventricular systolic pressure is 93.8 mmHg.  3. Right atrial size was mildly dilated.  4. The mitral valve is normal in structure. Trivial mitral valve regurgitation. No evidence of mitral stenosis.  5. The aortic valve is grossly normal. Aortic valve regurgitation is not visualized. No aortic stenosis is present.  6. The inferior vena cava is  dilated in size with >50% respiratory variability, suggesting right atrial pressure of 8 mmHg. Comparison(s): No prior Echocardiogram. Conclusion(s)/Recommendation(s): Severely elevated pulmonary pressures, with preservation of RV size/function. FINDINGS  Left Ventricle: Left ventricular ejection fraction, by estimation, is 55 to  60%. The left ventricle has normal function. The left ventricle has no regional wall motion abnormalities. The left ventricular internal cavity size was normal in size. There is  borderline left ventricular hypertrophy. Left ventricular diastolic parameters were normal. Right Ventricle: The right ventricular size is normal. No increase in right ventricular wall thickness. Right ventricular systolic function is normal. There is severely elevated pulmonary artery systolic pressure. The tricuspid regurgitant velocity is 3.94 m/s, and with an assumed right atrial pressure of 8 mmHg, the estimated right ventricular systolic pressure is 06.3 mmHg. Left Atrium: Left atrial size was normal in size. Right Atrium: Right atrial size was mildly dilated. Pericardium: There is no evidence of pericardial effusion. Mitral Valve: The mitral valve is normal in structure. Trivial mitral valve regurgitation. No evidence of mitral valve stenosis. Tricuspid Valve: The tricuspid valve is normal in structure. Tricuspid valve regurgitation is mild . No evidence of tricuspid stenosis. Aortic Valve: The aortic valve is grossly normal. Aortic valve regurgitation is not visualized. No aortic stenosis is present. Aortic valve peak gradient measures 52.4 mmHg. Pulmonic Valve: The pulmonic valve was not well visualized. Pulmonic valve regurgitation is not visualized. No evidence of pulmonic stenosis. Aorta: The aortic root, ascending aorta and aortic arch are all structurally normal, with no evidence of dilitation or obstruction. Venous: The inferior vena cava is dilated in size with greater than 50% respiratory  variability, suggesting right atrial pressure of 8 mmHg. IAS/Shunts: The atrial septum is grossly normal.  LEFT VENTRICLE PLAX 2D LVIDd:         5.00 cm      Diastology LVIDs:         3.50 cm      LV e' medial:    12.90 cm/s LV PW:         1.20 cm      LV E/e' medial:  4.9 LV IVS:        0.90 cm      LV e' lateral:   15.20 cm/s LVOT diam:     2.40 cm      LV E/e' lateral: 4.2 LV SV:         76 LV SV Index:   29 LVOT Area:     4.52 cm                              3D Volume EF: LV Volumes (MOD)            3D EF:        56 % LV vol d, MOD A2C: 118.0 ml LV EDV:       276 ml LV vol d, MOD A4C: 82.5 ml  LV ESV:       121 ml LV vol s, MOD A2C: 33.7 ml  LV SV:        155 ml LV vol s, MOD A4C: 38.8 ml LV SV MOD A2C:     84.3 ml LV SV MOD A4C:     82.5 ml LV SV MOD BP:      62.5 ml RIGHT VENTRICLE             IVC RV S prime:     15.10 cm/s  IVC diam: 2.60 cm TAPSE (M-mode): 1.8 cm LEFT ATRIUM             Index        RIGHT ATRIUM  Index LA diam:        3.60 cm 1.38 cm/m   RA Area:     22.10 cm LA Vol (A2C):   43.2 ml 16.53 ml/m  RA Volume:   64.00 ml  24.49 ml/m LA Vol (A4C):   29.4 ml 11.25 ml/m LA Biplane Vol: 35.4 ml 13.55 ml/m  AORTIC VALVE AV Area (Vmax): 1.31 cm AV Vmax:        362.00 cm/s AV Peak Grad:   52.4 mmHg LVOT Vmax:      105.00 cm/s LVOT Vmean:     69.700 cm/s LVOT VTI:       0.169 m  AORTA Ao Root diam: 3.60 cm Ao Asc diam:  3.60 cm MITRAL VALVE               TRICUSPID VALVE MV Area (PHT): 5.13 cm    TR Peak grad:   62.1 mmHg MV Decel Time: 148 msec    TR Vmax:        394.00 cm/s MV E velocity: 63.20 cm/s MV A velocity: 70.60 cm/s  SHUNTS MV E/A ratio:  0.90        Systemic VTI:  0.17 m                            Systemic Diam: 2.40 cm Buford Dresser MD Electronically signed by Buford Dresser MD Signature Date/Time: 04/05/2021/3:24:21 PM    Final    VAS [place] LOWER EXTREMITY VENOUS (DVT) (ONLY [facility] & [facility])  Result Date: 04/05/2021  Lower Venous DVT Study Patient Name:  MARQUAL MI  Date of Exam:   04/05/2021 Medical Rec #: [id number]         Accession #:    [id number] Date of Birth: 08-May-1971         Patient Gender: M Patient Age:   11 years Exam Location:  [facility] Procedure:      VAS [place] LOWER EXTREMITY VENOUS (DVT) Referring Phys: RILEY RANSOM --------------------------------------------------------------------------------  Indications: Swelling, and Edema.  Performing Technologist: Archie Patten RVS  Examination Guidelines: A complete evaluation includes B-mode imaging, spectral Doppler, color Doppler, and power Doppler as needed of all accessible portions of each vessel. Bilateral testing is considered an integral part of a complete examination. Limited examinations for reoccurring indications may be performed as noted. The reflux portion of the exam is performed with the patient in reverse Trendelenburg.  +-----+---------------+---------+-----------+----------+--------------+ RIGHTCompressibilityPhasicitySpontaneityPropertiesThrombus Aging +-----+---------------+---------+-----------+----------+--------------+ CFV  Full           Yes      Yes                                 +-----+---------------+---------+-----------+----------+--------------+   +---------+---------------+---------+-----------+----------+-----------------+ LEFT     CompressibilityPhasicitySpontaneityPropertiesThrombus Aging    +---------+---------------+---------+-----------+----------+-----------------+ CFV      Full           Yes      Yes                                    +---------+---------------+---------+-----------+----------+-----------------+ SFJ      Full                                                           +---------+---------------+---------+-----------+----------+-----------------+  FV Prox  Full                                                           +---------+---------------+---------+-----------+----------+-----------------+ FV Mid    Full                                                           +---------+---------------+---------+-----------+----------+-----------------+ FV DistalNone                                         Age Indeterminate +---------+---------------+---------+-----------+----------+-----------------+ PFV      Full                                                           +---------+---------------+---------+-----------+----------+-----------------+ POP      None           No       No                   Acute             +---------+---------------+---------+-----------+----------+-----------------+ PTV      None                                         Age Indeterminate +---------+---------------+---------+-----------+----------+-----------------+ PERO     Partial                                      Age Indeterminate +---------+---------------+---------+-----------+----------+-----------------+     Summary: RIGHT: - No evidence of common femoral vein obstruction.  LEFT: - Findings consistent with acute deep vein thrombosis involving the left popliteal vein. - Findings consistent with age indeterminate deep vein thrombosis involving the left femoral vein, left peroneal veins, and left posterior tibial veins.  *See table(s) above for measurements and observations. Electronically signed by Deitra Mayo MD on 04/05/2021 at 1:31:34 PM.    Final     EKG: Independently reviewed.  Normal sinus rhythm, no acute ST changes  Assessment/Plan Active Problems:   Pulmonary emboli (HCC)  (please populate well all problems here in Problem List. (For example, if patient is on BP meds at home and you resume or decide to hold them, it is a problem that needs to be her. Same for CAD, COPD, HLD and so on)  Submassive PE and DVT -CT confirmed submassive bilateral PE, although there is no signs of RV strain on the bedside Echocardiogram, increase of troponin level indicating probably earlier  right heart strain. -Heparin drip, and consider switch to p.o. anticoagulation to 1 to 2 days.  Discussed with pulm and critical care tomorrow. -Likely not provocative, hypercoagulable labs sent in ED, patient has been  following with [facility]ology for non-Hodgkin lymphoma in the past, expect patient go back to [facility] oncology/hematology for outpatient follow-up. -Repeat echocardiogram 6 weeks.  Positive troponins -Secondary to right heart strain. -Close monitoring patient vital signs and repeat troponin tomorrow morning.  Severe pulmonary hypertension -Secondary to submassive PE, treatment as above.  History of non-Hodgkin's lymphoma right-sided eyelid -In remission since 2021.  DVT prophylaxis: Heparin drip Code Status: Full code Family Communication: None at bedside Disposition Plan: Expect 1 to 2 days hospital stay Consults called: Pulmonary critical care Admission status: Telemetry admission   Lequita Halt MD [facility] Pager [phone]  04/05/2021, 4:29 PM

## 2021-04-05 NOTE — Consult Note (Signed)
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 He noticed left leg swelling and discomfort over the past few days.  He denies dizziness or passing out. He has no history of recent travel, surgery, immobilization, covid over the past year, or active cancer. He has no family history of VTE or multiple miscarriages. He has not been vaccinated for covid. He quit using smokeless tobacco about 2 year ago. He is a never smoker and denies illicit drug use. He does not have a history of bleeding. Upon presentation to the ED he was tachycardic but otherwise had stable VS. CTA demonstrated bilateral lower lobe PEs and RV enlargement. PCCM was consulted for management of PE.    Pertinent  Medical History  Obesity Occular MALT lymphoma (L eye, in remission)  Significant Hospital Events: Including procedures, antibiotic start and stop dates in addition to other pertinent events   10/23 admission  Interim History / Subjective:    Objective   Blood pressure 132/86, pulse 93, temperature 97.8 F (36.6 C), temperature source Oral, resp. rate 16, height 6\' 4"  (1.93 m), weight 133.8 kg, SpO2 96 %.        Intake/Output Summary (Last 24 hours) at 04/05/2021 1524 Last data filed at 04/05/2021 1305 Gross per 24 hour  Intake 999 ml  Output --  Net 999 ml   Filed Weights   04/05/21 1142  Weight: 133.8 kg    Examination: General: middle aged man lying flat in bed in NAD HENT: [place]/AT, eyes anicteric Lungs: breathign comfortably on RA, speaking in full sentences, CTAB Cardiovascular: S1S2, minimally tachycardic, reg rhythm. No murmurs. Abdomen: obese, soft,  NT Extremities: L>RLE edema Derm: petechiae on LLE, no rashes Neuro: awake and alert, moving all extremities, face symmetric, answering questions appropriately  Trop 50 LLE [place]: popliteal, peroneal, femoral, posterior tibial vein thrombus Echo prelim: normal RV size and function despite elevated PAP  Resolved Hospital Problem list     Assessment & Plan:  Acute submassive PE, unprovoked. PESI= 88 (class III, points for previous cancer and male sex.) Chronic PH, not acute RV strain present. Concern for baseline OSA given body habitus to explain elevated PAP with normal RV. -Stable to admit to telemetry on heparin. -Check lactic acid, BNP, echocardiogram to assist with risk stratification. -At this point, he is stable to receive IV heparin. Echo does not support the need for more aggressive treatment options.  -Check covid swab. -Con't heparin. -Discussed contraindications to TPA-- he does not have any. At this point thrombolytics are not indicated, but should he progress to massive PE, he does not have contraindications and verbally consented to receive TPA if needed. -Needs age appropriate cancer screening as an outpatient- needs a colonoscopy. -Needs follow up with his oncologist to ensure he remains in remission. Last visit in May 2022 he was in remission. -Will need to be discharged home on DOAC. Need to ensure his insurance will cover Xarelto vs Eliquis. We discussed anticoagulation risks and recommendations, including risk of spontaneous bleeding and need for evaluation if he develops significant bleeding or any head injury while on AC. Lifelong AC may be indicated given that this was  unprovoked. Hypercoagulability workup can be completed once he is off AC in several months if he would like. He is already established with a Heme-Onc doctor at [facility]. -May need OP PSG and follow up with Sleep Medicine. -Recommend covid vaccination given known risk of hypercoagulability with covid  infection. -Will need to establish with a PCP prior to discharge.   Best Practice (right click and "Reselect all SmartList Selections" daily)   Diet/type: per primary DVT prophylaxis: systemic heparin GI prophylaxis: N/A Lines: N/A Foley:  N/A Code Status:  full code Last date of multidisciplinary goals of care discussion [  ]  Labs   CBC: Recent Labs  Lab 04/05/21 0829  WBC 10.6*  NEUTROABS 8.7*  HGB 16.3  HCT 49.8  MCV 92.7  PLT 824    Basic Metabolic Panel: Recent Labs  Lab 04/05/21 0829  NA 138  K 4.1  CL 107  CO2 25  GLUCOSE 129*  BUN 14  CREATININE 1.21  CALCIUM 8.9   GFR: Estimated Creatinine Clearance: 111.5 mL/min (by C-G formula based on SCr of 1.21 mg/dL). Recent Labs  Lab 04/05/21 0829  WBC 10.6*    Liver Function Tests: Recent Labs  Lab 04/05/21 0829  AST 18  ALT 22  ALKPHOS 59  BILITOT 1.1  PROT 6.8  ALBUMIN 3.9   No results for input(s): LIPASE, AMYLASE in the last 168 hours. No results for input(s): AMMONIA in the last 168 hours.  ABG No results found for: PHART, PCO2ART, PO2ART, HCO3, TCO2, ACIDBASEDEF, O2SAT   Coagulation Profile: Recent Labs  Lab 04/05/21 1144  INR 1.3*    Cardiac Enzymes: No results for input(s): CKTOTAL, CKMB, CKMBINDEX, TROPONINI in the last 168 hours.  HbA1C: Hemoglobin A1C  Date/Time Value Ref Range Status  04/01/2016 01:09 PM 5.5  Final    CBG: No results for input(s): GLUCAP in the last 168 hours.  Review of Systems:   Review of Systems  Constitutional:  Negative for chills and fever.  HENT: Negative.    Eyes: Negative.        No symptoms of recurrent eyelid cancer  Respiratory:  Positive for shortness of breath. Negative for cough.   Cardiovascular:  Positive for palpitations and leg swelling. Negative for chest pain.  Gastrointestinal: Negative.   Genitourinary: Negative.   Musculoskeletal: Negative.   Skin:  Negative for rash.  Neurological:  Negative for dizziness and focal  weakness.  Endo/Heme/Allergies:  Does not bruise/bleed easily.    Past Medical History:  He,  has a past medical history of ACL tear - left knee (2353) and Umbilical hernia (6144).   Surgical History:   Past Surgical History:  Procedure Laterality Date   HERNIA REPAIR  3154   Umbilical     Social History:   reports that he has never smoked. His smokeless tobacco use includes snuff. He reports that he does not drink alcohol and does not use drugs.   Family History:  His family history includes Diabetes in his maternal grandfather, maternal uncle, and mother; Heart disease in his maternal aunt; Lung cancer in his paternal grandfather and paternal grandmother.   Allergies No Known Allergies   Home Medications  Prior to Admission medications   Not on File     Critical care time:      Julian Hy, DO 04/05/21 3:31 PM [facility]

## 2021-04-05 NOTE — ED Notes (Signed)
Pt placed into inpatient hospital bed 

## 2021-04-05 NOTE — ED Notes (Signed)
RN transferred pt to hospital bed

## 2021-04-05 NOTE — ED Provider Notes (Signed)
Emergency Medicine Provider Triage Evaluation Note  James Jordan , a 50 y.o. male  was evaluated in triage.  Pt complains of an episode of shortness of breath and palpitations this morning as he was bending down to pick up a out of the shower this morning.  Patient reports he is not having symptoms before.  He denies any chest pain, diaphoresis, nausea, or vomiting.  He denies any hypertension, CHF.  Denies any history of PE or DVTs.  Denies any exogenous hormone use.  Denies any history of factor V Leiden, von Willebrand, or any other bleeding disorder.  Review of Systems  Positive: Shortness breath, palpitations Negative: Chest pain diaphoresis nausea vomiting  Physical Exam  BP (!) 157/94 (BP Location: Right Arm)   Pulse (!) 106   Temp 97.8 F (36.6 C) (Oral)   Resp 16   SpO2 97%  Gen:   Awake, no distress    Resp:  Normal effort  MSK:   Moves extremities without difficulty  Other:  Bilateral lower leg edema left greater than right with erythema.  Nontender to palpation.  Medical Decision Making  Medically screening exam initiated at 8:23 AM.  Appropriate orders placed.  James Jordan was informed that the remainder of the evaluation will be completed by another provider, this initial triage assessment does not replace that evaluation, and the importance of remaining in the ED until their evaluation is complete.  Given patient's symptoms will order DVT study and appropriate labs.   Sherrell Puller, PA-C 04/05/21 0354    Luna Fuse, MD 04/05/21 1500

## 2021-04-05 NOTE — Progress Notes (Signed)
ANTICOAGULATION CONSULT NOTE - Initial Consult  Pharmacy Consult for Heparin Indication:  VTE Treatment  No Known Allergies  Patient Measurements:   Heparin Dosing Weight: 116.1 kg  Vital Signs: Temp: 97.8 F (36.6 C) (10/23 0816) Temp Source: Oral (10/23 0816) BP: 142/84 (10/23 1053) Pulse Rate: 78 (10/23 1053)  Labs: Recent Labs    04/05/21 0829  HGB 16.3  HCT 49.8  PLT 226  CREATININE 1.21    CrCl cannot be calculated (Unknown ideal weight.).   Medical History: Past Medical History:  Diagnosis Date   ACL tear - left knee 2002   Per patient, not repaired    Umbilical hernia [id number]    Medications:  (Not in a hospital admission)  Scheduled:  Infusions:   sodium chloride     PRN:   Assessment: 53 yom presenting with shortness of breath and palpitations this morning. Patient denies history of PE/DVTs. Heparin per pharmacy consult placed for  VTE Treatment .  [place] this morning with findings of DVT.  Patient is on not on anticoagulation prior to arrival.  Hgb 16.3; plt 226 D-Dimer 17.35  Goal of Therapy:  Heparin level 0.3-0.7 units/ml Monitor platelets by anticoagulation protocol: Yes   Plan:  Give 7500 units bolus x 1 Start heparin infusion at 2000 units/hr Check anti-Xa level in 6 hours and daily while on heparin Continue to monitor H&H and platelets Follow up plan for [facility] prior to discharge - will need education  Lorelei Pont, PharmD, BCPS 04/05/2021 11:32 AM ED Clinical Pharmacist -  [phone]

## 2021-04-06 ENCOUNTER — Telehealth: Payer: Self-pay | Admitting: Physician Assistant

## 2021-04-06 DIAGNOSIS — I82432 Acute embolism and thrombosis of left popliteal vein: Secondary | ICD-10-CM

## 2021-04-06 DIAGNOSIS — I2699 Other pulmonary embolism without acute cor pulmonale: Secondary | ICD-10-CM | POA: Diagnosis not present

## 2021-04-06 LAB — HEPARIN LEVEL (UNFRACTIONATED)
Heparin Unfractionated: 0.3 IU/mL (ref 0.30–0.70)
Heparin Unfractionated: <0.1 IU/mL — ABNORMAL LOW (ref 0.30–0.70)
Heparin Unfractionated: 0.38 IU/mL (ref 0.30–0.70)

## 2021-04-06 LAB — CBC
HCT: 45.3 % (ref 39.0–52.0)
Hemoglobin: 15.1 g/dL (ref 13.0–17.0)
MCH: 30.6 pg (ref 26.0–34.0)
MCHC: 33.3 g/dL (ref 30.0–36.0)
MCV: 91.9 fL (ref 80.0–100.0)
Platelets: 191 10*3/uL (ref 150–400)
RBC: 4.93 MIL/uL (ref 4.22–5.81)
RDW: 12.9 % (ref 11.5–15.5)
WBC: 9.5 10*3/uL (ref 4.0–10.5)
nRBC: 0 % (ref 0.0–0.2)

## 2021-04-06 LAB — TROPONIN I (HIGH SENSITIVITY): Troponin I (High Sensitivity): 78 ng/L — ABNORMAL HIGH (ref <18)

## 2021-04-06 LAB — PROTEIN C, TOTAL: Protein C, Total: 102 % (ref 60–150)

## 2021-04-06 NOTE — ED Notes (Signed)
Pt resting in bed, wife at bedside, no distress noted, denies needs, call light in reach.

## 2021-04-06 NOTE — Plan of Care (Signed)
Initially created New admission Problem: Education: Goal: Knowledge of General Education information will improve Description: Including pain rating scale, medication(s)/side effects and non-pharmacologic comfort measures Outcome: Not Applicable   Problem: Health Behavior/Discharge Planning: Goal: Ability to manage health-related needs will improve Outcome: Not Applicable   Problem: Clinical Measurements: Goal: Ability to maintain clinical measurements within normal limits will improve Outcome: Not Applicable Goal: Will remain free from infection Outcome: Not Applicable Goal: Diagnostic test results will improve Outcome: Not Applicable Goal: Respiratory complications will improve Outcome: Not Applicable Goal: Cardiovascular complication will be avoided Outcome: Not Applicable

## 2021-04-06 NOTE — Progress Notes (Signed)
ANTICOAGULATION CONSULT NOTE  Pharmacy Consult for Heparin Indication: DVT/PE  No Known Allergies  Patient Measurements: Height: 6\' 4"  (193 cm) Weight: 133.8 kg (295 lb) IBW/kg (Calculated) : 86.8 Heparin Dosing Weight: 116.1 kg  Vital Signs: BP: 135/82 (10/24 0000) Pulse Rate: 70 (10/24 0000)  Labs: Recent Labs    04/05/21 0829 04/05/21 1144 04/05/21 1319 04/05/21 1900 04/06/21 0114  HGB 16.3  --   --   --   --   HCT 49.8  --   --   --   --   PLT 226  --   --   --   --   LABPROT  --  15.7*  --   --   --   INR  --  1.3*  --   --   --   HEPARINUNFRC  --   --   --  0.53 0.30  CREATININE 1.21  --   --   --   --   TROPONINIHS 50*  --  194*  --   --      Estimated Creatinine Clearance: 111.5 mL/min (by C-G formula based on SCr of 1.21 mg/dL).   Medical History: Past Medical History:  Diagnosis Date   ACL tear - left knee 2002   Per patient, not repaired    Umbilical hernia 8453    Medications:  (Not in a hospital admission)  Scheduled:  Infusions:   heparin 2,000 Units/hr (04/05/21 2218)   PRN:   Assessment: 48 yom presenting with shortness of breath and palpitations this morning. Patient denies history of PE/DVTs. Heparin per pharmacy consult placed for  VTE Treatment . Patient is on not on anticoagulation prior to arrival.  [place] this morning with findings of DVT. CT confirmed submassive bilateral PE w/o RV strain  Patient given 7500 unit bolus and started on 2000 units/hr infusion of heparin. Heparin level this evening back at 0.53 which is therapeutic.  Hgb 16.3; plt 226 D-Dimer 17.35  10/24 AM update:  -Heparin level low end of goal and trending down -Will increase heparin slightly to aim for middle of goal range  Goal of Therapy:  Heparin level 0.3-0.7 units/ml Monitor platelets by anticoagulation protocol: Yes   Plan:  Inc heparin to 2150 units/hr 1200 heparin level  Narda Bonds, PharmD, BCPS Clinical Pharmacist Phone: [phone]

## 2021-04-06 NOTE — Progress Notes (Signed)
PROGRESS NOTE    James Jordan  IRW:[id number] DOB: [birth year] DOA: 04/05/2021 PCP: Pcp, No   Brief Narrative/Hospital Course:  James Jordan, 50 y.o. 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  In the ED borderline tachycardia, otherwise hemodynamically stable, CT angio positive for large volume pulmonary embolism bilateral with RV: LV ratio 1.1 1 implying right heart strain DVT study positive for left leg DVT femoral, peroneal and tibial vein, bedside echo with normal RV size severely elevated pulmonary artery pressure of 70 mmHg, pulmonary was consulted and advised heparin non-tPA protocol and patient was admitted.   Subjective: Seen and examined this morning.  He reports his leg edema is improving.  Sometimes he has to catch his breath otherwise no significant complaints no chest pain no nausea vomiting.  He is doing well on room air.  Hemodynamically stable.  Nursing reports he has been ambulating to the bathroom without issues.  Assessment & Plan:  Bilateral pulmonary embolism  left leg DVT femoral, peroneal and tibial vein severely elevated pulmonary artery pressure of 70 mmHg in echo: Appreciate pulmonary input, currently hemodynamic stable, on room air.  Continue IV heparin drip.  Follow-up hypercoagulable labs, likely none provocative VTE, he will need to go back and follow-up with his hematology oncology at [facility].  No indication for tPA as per pulmonary we will do at least for 8 hours of IV heparin then convert to DOAC.  Pulmonary planning for outpatient sleep apnea evaluation given pulmonary hypertension  Positive troponin suspect demand ischemia in the setting of deep PE: EF preserved on echo. T ropjnin is downtrending, currently no chest pain  Rright eyelid MALT non-Hodgkin's  lymphoma status post RT in 2020 has been in remission since.  Class II Obesity:Patient's Body mass index is 35.91 kg/m. : Will benefit with PCP follow-up, weight loss  healthy lifestyle and outpatient sleep evaluation.  DVT prophylaxis:  Code Status:   Code Status: Full Code Family Communication: plan of care discussed with patient at bedside. Status is: Inpatient  Remains inpatient appropriate because: For ongoing management of DVT and PE with IV heparin   Objective: Vitals last 24 hrs: Vitals:   04/06/21 0700 04/06/21 0726 04/06/21 0900 04/06/21 1000  BP: 134/81 (!) 117/99 123/71 132/76  Pulse: 65 85 72 67  Resp: 16 19 14 15   Temp:      TempSrc:      SpO2: 96% 97% 93% 94%  Weight:      Height:       Weight change:   Intake/Output Summary (Last 24 hours) at 04/06/2021 1154 Last data filed at 04/05/2021 1305 Gross per 24 hour  Intake 999 ml  Output --  Net 999 ml   Net IO Since Admission: 999 mL [04/06/21 1154]   Physical Examination: General exam: AA0X3, OBESE,weak,older than stated age. HEENT:Oral mucosa moist, Ear/Nose WNL grossly,dentition normal. Respiratory system: B/l clear BS, no use of accessory muscle, non tender. Cardiovascular system: S1 & S2 +,No JVD. Gastrointestinal system: Abdomen soft, NT,ND, BS+. Nervous System:Alert, awake, moving extremities. Extremities: edema mild LE, distal peripheral pulses palpable.  Skin: No rashes, no icterus. MSK: Normal muscle bulk, tone, power.  Medications reviewed:  Scheduled Meds: Continuous Infusions:  heparin 2,150 Units/hr (04/06/21 1103)   Diet Order  Diet regular Room service appropriate? Yes; Fluid consistency: Thin  Diet effective now                          Weight change:   Wt Readings from Last 3 Encounters:  04/05/21 133.8 kg  09/12/20 129.3 kg  10/19/18 132.9 kg     Consultants:see note  Procedures:see note Antimicrobials: Anti-infectives (From admission, onward)     None      Culture/Microbiology No results found for: SDES, SPECREQUEST, CULT, REPTSTATUS  Other culture-see note  Unresulted Labs (From admission, onward)     Start     Ordered   04/06/21 1200  Heparin level (unfractionated)  Once-Timed,   TIMED        04/06/21 0325   04/06/21 0500  Heparin level (unfractionated)  Daily,   R      04/05/21 1144   04/05/21 1628  HIV Antibody (routine testing w rflx)  (HIV Antibody (Routine testing w reflex) panel)  Once,   R        04/05/21 1629   04/05/21 1215  Protein C activity  (Hypercoagulable Panel, Comprehensive (PNL))  Once,   STAT        04/05/21 1215   04/05/21 1215  Protein C, total  (Hypercoagulable Panel, Comprehensive (PNL))  Once,   STAT        04/05/21 1215   04/05/21 1215  Protein S activity  (Hypercoagulable Panel, Comprehensive (PNL))  Once,   STAT        04/05/21 1215   04/05/21 1215  Protein S, total  (Hypercoagulable Panel, Comprehensive (PNL))  Once,   STAT        04/05/21 1215   04/05/21 1215  Lupus anticoagulant panel  (Hypercoagulable Panel, Comprehensive (PNL))  Once,   STAT        04/05/21 1215   04/05/21 1215  Beta-2-glycoprotein i abs, IgG/M/A  (Hypercoagulable Panel, Comprehensive (PNL))  Once,   STAT        04/05/21 1215   04/05/21 1215  Homocysteine, serum  (Hypercoagulable Panel, Comprehensive (PNL))  Once,   STAT        04/05/21 1215   04/05/21 1215  Factor 5 leiden  (Hypercoagulable Panel, Comprehensive (PNL))  Once,   STAT        04/05/21 1215   04/05/21 1215  Prothrombin gene mutation  (Hypercoagulable Panel, Comprehensive (PNL))  Once,   STAT        04/05/21 1215   04/05/21 1215  Cardiolipin antibodies, IgG, IgM, IgA  (Hypercoagulable Panel, Comprehensive (PNL))  Once,   STAT        04/05/21 1215          Data Reviewed: I have personally reviewed following labs and imaging studies CBC: Recent Labs  Lab 04/05/21 0829 04/06/21 0506  WBC 10.6* 9.5  NEUTROABS 8.7*  --   HGB 16.3 15.1  HCT 49.8 45.3   MCV 92.7 91.9  PLT 226 270   Basic Metabolic Panel: Recent Labs  Lab 04/05/21 0829  NA 138  K 4.1  CL 107  CO2 25  GLUCOSE 129*  BUN 14  CREATININE 1.21  CALCIUM 8.9   GFR: Estimated Creatinine Clearance: 111.5 mL/min (by C-G formula based on SCr of 1.21 mg/dL). Liver Function Tests: Recent Labs  Lab 04/05/21 0829  AST 18  ALT 22  ALKPHOS 59  BILITOT 1.1  PROT 6.8  ALBUMIN 3.9   No results for  input(s): LIPASE, AMYLASE in the last 168 hours. No results for input(s): AMMONIA in the last 168 hours. Coagulation Profile: Recent Labs  Lab 04/05/21 1144  INR 1.3*   Cardiac Enzymes: No results for input(s): CKTOTAL, CKMB, CKMBINDEX, TROPONINI in the last 168 hours. BNP (last 3 results) No results for input(s): PROBNP in the last 8760 hours. HbA1C: No results for input(s): HGBA1C in the last 72 hours. CBG: No results for input(s): GLUCAP in the last 168 hours. Lipid Profile: No results for input(s): CHOL, HDL, LDLCALC, TRIG, CHOLHDL, LDLDIRECT in the last 72 hours. Thyroid Function Tests: No results for input(s): TSH, T4TOTAL, FREET4, T3FREE, THYROIDAB in the last 72 hours. Anemia Panel: No results for input(s): VITAMINB12, FOLATE, FERRITIN, TIBC, IRON, RETICCTPCT in the last 72 hours. Sepsis Labs: Recent Labs  Lab 04/05/21 1410  LATICACIDVEN 0.8    Recent Results (from the past 240 hour(s))  Resp Panel by RT-PCR (Flu A&B, Covid) Nasopharyngeal Swab     Status: None   Collection Time: 04/05/21 12:18 PM   Specimen: Nasopharyngeal Swab; Nasopharyngeal(NP) swabs in vial transport medium  Result Value Ref Range Status   SARS Coronavirus 2 by RT PCR NEGATIVE NEGATIVE Final    Comment: (NOTE) SARS-CoV-2 target nucleic acids are NOT DETECTED.  The SARS-CoV-2 RNA is generally detectable in upper respiratory specimens during the acute phase of infection. The lowest concentration of SARS-CoV-2 viral copies this assay can detect is 138 copies/mL. A negative result  does not preclude SARS-Cov-2 infection and should not be used as the sole basis for treatment or other patient management decisions. A negative result may occur with  improper specimen collection/handling, submission of specimen other than nasopharyngeal swab, presence of viral mutation(s) within the areas targeted by this assay, and inadequate number of viral copies(<138 copies/mL). A negative result must be combined with clinical observations, patient history, and epidemiological information. The expected result is Negative.  Fact Sheet for Patients:  EntrepreneurPulse.com.au  Fact Sheet for Healthcare Providers:  IncredibleEmployment.be  This test is no t yet approved or cleared by the [place] FDA and  has been authorized for detection and/or diagnosis of SARS-CoV-2 by FDA under an Emergency Use Authorization (EUA). This EUA will remain  in effect (meaning this test can be used) for the duration of the COVID-19 declaration under Section 564(b)(1) of the Act, 21 U.S.C.section 360bbb-3(b)(1), unless the authorization is terminated  or revoked sooner.       Influenza A by PCR NEGATIVE NEGATIVE Final   Influenza B by PCR NEGATIVE NEGATIVE Final    Comment: (NOTE) The Xpert Xpress SARS-CoV-2/FLU/RSV plus assay is intended as an aid in the diagnosis of influenza from Nasopharyngeal swab specimens and should not be used as a sole basis for treatment. Nasal washings and aspirates are unacceptable for Xpert Xpress SARS-CoV-2/FLU/RSV testing.  Fact Sheet for Patients: EntrepreneurPulse.com.au  Fact Sheet for Healthcare Providers: IncredibleEmployment.be  This test is not yet approved or cleared by the [place] FDA and has been authorized for detection and/or diagnosis of SARS-CoV-2 by FDA under an Emergency Use Authorization (EUA). This EUA will remain in effect (meaning this test can be used) for  the duration of the COVID-19 declaration under Section 564(b)(1) of the Act, 21 U.S.C. section 360bbb-3(b)(1), unless the authorization is terminated or revoked.  Performed at [facility], [place] [address] [place], [place] [address]      Radiology Studies: CT Angio Chest PE W and/or Wo Contrast  Result Date: 04/05/2021 CLINICAL DATA:  Shortness of breath with tachycardia on awakening today. Intermittent left calf pain for 3 days with swelling. Clinical concern for pulmonary embolism. EXAM: CT ANGIOGRAPHY CHEST WITH CONTRAST TECHNIQUE: Multidetector CT imaging of the chest was performed using the standard protocol during bolus administration of intravenous contrast. Multiplanar CT image reconstructions and MIPs were obtained to evaluate the vascular anatomy. CONTRAST:  173mL OMNIPAQUE IOHEXOL 350 MG/ML SOLN COMPARISON:  Chest radiographs 10/19/2018. FINDINGS: Cardiovascular: The pulmonary arteries are well opacified with contrast to the level of the subsegmental branches. Study is positive for extensive acute pulmonary thromboembolic disease bilaterally with nearly occlusive lobar and segmental branch emboli bilaterally. There is occlusive thrombus within the right lower lobe segmental and subsegmental branches. No evidence of thrombus within the main pulmonary artery, right ventricle or right atrium. No significant systemic arterial abnormalities are identified. There is possible right heart strain with dilatation of the right ventricle relative to the right atrium (4.3 versus 3.9 cm). The heart size is normal. There is no pericardial effusion. Mediastinum/Nodes: There are no enlarged mediastinal, hilar or axillary lymph nodes. The thyroid gland, trachea and esophagus demonstrate no significant findings. Lungs/Pleura: No pleural effusion or pneumothorax. The lungs are essentially clear, without evidence of pulmonary infarct. There is minimal atelectasis in the lingula. Upper abdomen: Hepatic low  density consistent with steatosis. No focal or acute abnormality identified. Probable small cyst anteriorly in the mid right kidney. Musculoskeletal/Chest wall: There is no chest wall mass or suspicious osseous finding. Review of the MIP images confirms the above findings. IMPRESSION: 1. Large volume of pulmonary emboli in the lungs bilaterally, with dilatation of the right atrium and right ventricle (RV to LV ratio of 1.1) indicative of elevated right-sided heart pressures and right heart strain. These findings have been shown to be associated with a increased morbidity and mortality in the setting pulmonary embolism. 2. No evidence of pulmonary infarct or significant pleural effusion. 3. Hepatic steatosis. 4. Critical Value/emergent results were called by telephone at the time of interpretation on 04/05/2021 at 1:15 pm to provider JULIE HAVILAND , who verbally acknowledged these results. Electronically Signed   By: Richardean Sale M.D.   On: 04/05/2021 13:21   ECHOCARDIOGRAM COMPLETE  Result Date: 04/05/2021    ECHOCARDIOGRAM REPORT   Patient Name:   James Jordan Date of Exam: 04/05/2021 Medical Rec #:  [id number]        Height:       76.0 in Accession #:    [id number]       Weight:       295.0 lb Date of Birth:  Jul 26, 1970        BSA:          2.613 m Patient Age:    50 years         BP:           149/96 mmHg Patient Gender: M                HR:           100 bpm. Exam Location:  Inpatient Procedure: 2D Echo, 3D Echo, Cardiac Doppler and Color Doppler                STAT ECHO  Reported to Dr. Gilford Raid and Dr. Carlis Abbott. Indications:    I26.02 Pulmonary embolus  History:        Patient has no prior history of Echocardiogram examinations.  Abnormal ECG, Arrythmias:PAC; Signs/Symptoms:Shortness of Breath                 and Dyspnea.  Sonographer:    Roseanna Rainbow RDCS Referring Phys: [id number] GRACE E BOWSER  Sonographer Comments: Technically difficult study due to poor echo windows. Image acquisition  challenging due to patient body habitus. IMPRESSIONS  1. Left ventricular ejection fraction, by estimation, is 55 to 60%. The left ventricle has normal function. The left ventricle has no regional wall motion abnormalities. Left ventricular diastolic parameters were normal.  2. Right ventricular systolic function is normal. The right ventricular size is normal. There is severely elevated pulmonary artery systolic pressure. The estimated right ventricular systolic pressure is 21.1 mmHg.  3. Right atrial size was mildly dilated.  4. The mitral valve is normal in structure. Trivial mitral valve regurgitation. No evidence of mitral stenosis.  5. The aortic valve is grossly normal. Aortic valve regurgitation is not visualized. No aortic stenosis is present.  6. The inferior vena cava is dilated in size with >50% respiratory variability, suggesting right atrial pressure of 8 mmHg. Comparison(s): No prior Echocardiogram. Conclusion(s)/Recommendation(s): Severely elevated pulmonary pressures, with preservation of RV size/function. FINDINGS  Left Ventricle: Left ventricular ejection fraction, by estimation, is 55 to 60%. The left ventricle has normal function. The left ventricle has no regional wall motion abnormalities. The left ventricular internal cavity size was normal in size. There is  borderline left ventricular hypertrophy. Left ventricular diastolic parameters were normal. Right Ventricle: The right ventricular size is normal. No increase in right ventricular wall thickness. Right ventricular systolic function is normal. There is severely elevated pulmonary artery systolic pressure. The tricuspid regurgitant velocity is 3.94 m/s, and with an assumed right atrial pressure of 8 mmHg, the estimated right ventricular systolic pressure is 94.1 mmHg. Left Atrium: Left atrial size was normal in size. Right Atrium: Right atrial size was mildly dilated. Pericardium: There is no evidence of pericardial effusion. Mitral Valve:  The mitral valve is normal in structure. Trivial mitral valve regurgitation. No evidence of mitral valve stenosis. Tricuspid Valve: The tricuspid valve is normal in structure. Tricuspid valve regurgitation is mild . No evidence of tricuspid stenosis. Aortic Valve: The aortic valve is grossly normal. Aortic valve regurgitation is not visualized. No aortic stenosis is present. Aortic valve peak gradient measures 52.4 mmHg. Pulmonic Valve: The pulmonic valve was not well visualized. Pulmonic valve regurgitation is not visualized. No evidence of pulmonic stenosis. Aorta: The aortic root, ascending aorta and aortic arch are all structurally normal, with no evidence of dilitation or obstruction. Venous: The inferior vena cava is dilated in size with greater than 50% respiratory variability, suggesting right atrial pressure of 8 mmHg. IAS/Shunts: The atrial septum is grossly normal.  LEFT VENTRICLE PLAX 2D LVIDd:         5.00 cm      Diastology LVIDs:         3.50 cm      LV e' medial:    12.90 cm/s LV PW:         1.20 cm      LV E/e' medial:  4.9 LV IVS:        0.90 cm      LV e' lateral:   15.20 cm/s LVOT diam:     2.40 cm      LV E/e' lateral: 4.2 LV SV:         76 LV SV Index:   29 LVOT Area:  4.52 cm                              3D Volume EF: LV Volumes (MOD)            3D EF:        56 % LV vol d, MOD A2C: 118.0 ml LV EDV:       276 ml LV vol d, MOD A4C: 82.5 ml  LV ESV:       121 ml LV vol s, MOD A2C: 33.7 ml  LV SV:        155 ml LV vol s, MOD A4C: 38.8 ml LV SV MOD A2C:     84.3 ml LV SV MOD A4C:     82.5 ml LV SV MOD BP:      62.5 ml RIGHT VENTRICLE             IVC RV S prime:     15.10 cm/s  IVC diam: 2.60 cm TAPSE (M-mode): 1.8 cm LEFT ATRIUM             Index        RIGHT ATRIUM           Index LA diam:        3.60 cm 1.38 cm/m   RA Area:     22.10 cm LA Vol (A2C):   43.2 ml 16.53 ml/m  RA Volume:   64.00 ml  24.49 ml/m LA Vol (A4C):   29.4 ml 11.25 ml/m LA Biplane Vol: 35.4 ml 13.55 ml/m  AORTIC  VALVE AV Area (Vmax): 1.31 cm AV Vmax:        362.00 cm/s AV Peak Grad:   52.4 mmHg LVOT Vmax:      105.00 cm/s LVOT Vmean:     69.700 cm/s LVOT VTI:       0.169 m  AORTA Ao Root diam: 3.60 cm Ao Asc diam:  3.60 cm MITRAL VALVE               TRICUSPID VALVE MV Area (PHT): 5.13 cm    TR Peak grad:   62.1 mmHg MV Decel Time: 148 msec    TR Vmax:        394.00 cm/s MV E velocity: 63.20 cm/s MV A velocity: 70.60 cm/s  SHUNTS MV E/A ratio:  0.90        Systemic VTI:  0.17 m                            Systemic Diam: 2.40 cm Buford Dresser MD Electronically signed by Buford Dresser MD Signature Date/Time: 04/05/2021/3:24:21 PM    Final    VAS [place] LOWER EXTREMITY VENOUS (DVT) (ONLY [facility] & [facility])  Result Date: 04/05/2021  Lower Venous DVT Study Patient Name:  James Jordan  Date of Exam:   04/05/2021 Medical Rec #: [id number]         Accession #:    [id number] Date of Birth: 1971/05/12         Patient Gender: M Patient Age:   80 years Exam Location:  [facility] Procedure:      VAS [place] LOWER EXTREMITY VENOUS (DVT) Referring Phys: RILEY RANSOM --------------------------------------------------------------------------------  Indications: Swelling, and Edema.  Performing Technologist: Archie Patten RVS  Examination Guidelines: A complete evaluation includes B-mode imaging, spectral Doppler, color Doppler, and power Doppler as needed of  all accessible portions of each vessel. Bilateral testing is considered an integral part of a complete examination. Limited examinations for reoccurring indications may be performed as noted. The reflux portion of the exam is performed with the patient in reverse Trendelenburg.  +-----+---------------+---------+-----------+----------+--------------+ RIGHTCompressibilityPhasicitySpontaneityPropertiesThrombus Aging +-----+---------------+---------+-----------+----------+--------------+ CFV  Full           Yes      Yes                                  +-----+---------------+---------+-----------+----------+--------------+   +---------+---------------+---------+-----------+----------+-----------------+ LEFT     CompressibilityPhasicitySpontaneityPropertiesThrombus Aging    +---------+---------------+---------+-----------+----------+-----------------+ CFV      Full           Yes      Yes                                    +---------+---------------+---------+-----------+----------+-----------------+ SFJ      Full                                                           +---------+---------------+---------+-----------+----------+-----------------+ FV Prox  Full                                                           +---------+---------------+---------+-----------+----------+-----------------+ FV Mid   Full                                                           +---------+---------------+---------+-----------+----------+-----------------+ FV DistalNone                                         Age Indeterminate +---------+---------------+---------+-----------+----------+-----------------+ PFV      Full                                                           +---------+---------------+---------+-----------+----------+-----------------+ POP      None           No       No                   Acute             +---------+---------------+---------+-----------+----------+-----------------+ PTV      None                                         Age Indeterminate +---------+---------------+---------+-----------+----------+-----------------+ PERO     Partial  Age Indeterminate +---------+---------------+---------+-----------+----------+-----------------+     Summary: RIGHT: - No evidence of common femoral vein obstruction.  LEFT: - Findings consistent with acute deep vein thrombosis involving the left popliteal vein. - Findings consistent with age indeterminate deep  vein thrombosis involving the left femoral vein, left peroneal veins, and left posterior tibial veins.  *See table(s) above for measurements and observations. Electronically signed by Deitra Mayo MD on 04/05/2021 at 1:31:34 PM.    Final      LOS: 1 day   Antonieta Pert, MD [facility]  04/06/2021, 11:54 AM

## 2021-04-06 NOTE — Progress Notes (Signed)
ANTICOAGULATION CONSULT NOTE  Pharmacy Consult for Heparin Indication: DVT/PE  No Known Allergies  Patient Measurements: Height: 6\' 4"  (193 cm) Weight: 133.8 kg (295 lb) IBW/kg (Calculated) : 86.8 Heparin Dosing Weight: 116.1 kg  Vital Signs: BP: 115/59 (10/24 1300) Pulse Rate: 73 (10/24 1300)  Labs: Recent Labs    04/05/21 0829 04/05/21 1144 04/05/21 1319 04/05/21 1900 04/06/21 0114 04/06/21 0506 04/06/21 1232  HGB 16.3  --   --   --   --  15.1  --   HCT 49.8  --   --   --   --  45.3  --   PLT 226  --   --   --   --  191  --   LABPROT  --  15.7*  --   --   --   --   --   INR  --  1.3*  --   --   --   --   --   HEPARINUNFRC  --   --   --    < > 0.30 <0.10* 0.38  CREATININE 1.21  --   --   --   --   --   --   TROPONINIHS 50*  --  194*  --   --  78*  --    < > = values in this interval not displayed.     Estimated Creatinine Clearance: 111.5 mL/min (by C-G formula based on SCr of 1.21 mg/dL).   Medical History: Past Medical History:  Diagnosis Date   ACL tear - left knee 2002   Per patient, not repaired    Umbilical hernia 0981    Medications:  (Not in a hospital admission)  Scheduled:  Infusions:   heparin 2,150 Units/hr (04/06/21 1103)   PRN:   Assessment: 72 yom presenting with shortness of breath and palpitations this morning. Patient denies history of PE/DVTs. Heparin per pharmacy consult placed for  VTE Treatment . Patient is on not on anticoagulation prior to arrival.  [place] this morning with findings of DVT. CT confirmed submassive bilateral PE w/o RV strain  Hgb 16.3; plt 226 D-Dimer 17.35  Heparin level now therapeutic s/p rate increase to 2150 units/hr  Goal of Therapy:  Heparin level 0.3-0.7 units/ml Monitor platelets by anticoagulation protocol: Yes   Plan:  Inc heparin gtt slightly to 2200 units/hr Daily heparin level, CBC, s/s bleeding F/u long term AC plan and ability to transition to PO  Bertis Ruddy, PharmD Clinical  Pharmacist ED Pharmacist Phone # [phone] 04/06/2021 1:42 PM

## 2021-04-06 NOTE — ED Notes (Signed)
James Jordan with pharmacy informed of heparin level of <0.10.. no new orders received.

## 2021-04-06 NOTE — Progress Notes (Signed)
   NAME:  James Jordan, MRN:  [id number], DOB:  07-26-1970, LOS: 1 ADMISSION DATE:  04/05/2021, CONSULTATION DATE:  04/05/21 REFERRING MD:  Everlena Cooper, CHIEF COMPLAINT:  submassive PE   History of Present Illness:  Mr. 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 He noticed left leg swelling and discomfort over the past few days.  He denies dizziness or passing out. He has no history of recent travel, surgery, immobilization, covid over the past year, or active cancer. He has no family history of VTE or multiple miscarriages. He has not been vaccinated for covid. He quit using smokeless tobacco about 2 year ago. He is a never smoker and denies illicit drug use. He does not have a history of bleeding. Upon presentation to the ED he was tachycardic but otherwise had stable VS. CTA demonstrated bilateral lower lobe PEs and RV enlargement. PCCM was consulted for management of PE.    Pertinent  Medical History  Obesity Occular MALT lymphoma (L eye, in remission)  Significant Hospital Events: Including procedures, antibiotic start and stop dates in addition to other pertinent events   10/23 admission  Interim History / Subjective:  Comfortable on room air.  Vitals stable.  Objective   Blood pressure 123/71, pulse 72, temperature 97.8 F (36.6 C), temperature source Oral, resp. rate 14, height 6\' 4"  (1.93 m), weight 133.8 kg, SpO2 93 %.        Intake/Output Summary (Last 24 hours) at 04/06/2021 0942 Last data filed at 04/05/2021 1305 Gross per 24 hour  Intake 999 ml  Output --  Net 999 ml    Filed Weights   04/05/21 1142  Weight: 133.8 kg    Examination: General: Adult male, resting comfortable in bed, in NAD. Neuro: A&O x 3, no deficits. HEENT: [place]/AT. Sclerae anicteric. EOMI. Cardiovascular: RRR, no M/R/G.  Lungs: Respirations even and unlabored.   CTA bilaterally, No W/R/R. Abdomen: BS x 4, soft, NT/ND.  Musculoskeletal: No gross deformities, no edema.  Skin: Intact, warm, no rashes.  Assessment & Plan:   Acute submassive PE with LLE DVT, unprovoked. PESI= 88 (class III, points for previous cancer and male sex.)  Echo reassuring. Chronic PH, not acute RV strain present. Concern for baseline OSA given body habitus to explain elevated PAP with normal RV. - Continue Heparin for now until determination can be made on DOAC insurance coverage. - No role for tPA. - Telephone message sent to our office to please help arrange for outpatient PSG and follow up with one of our sleep providers. - Recommend covid vaccination given known risk of hypercoagulability with covid infection. - Will need to establish with a PCP prior to discharge. - F/u with heme[facility]onc at [facility] )already established there).  Rest per [facility].  Discussed with Dr. Maren Beach and pt at bedside. Nothing further to add.  PCCM will sign off.  Please call us back if we can be of any further assistance.   Montey Hora, [facility] For pager details, please see AMION or use Epic chat  After 1900, please call [facility] for cross coverage needs 04/06/2021, 9:48 AM

## 2021-04-06 NOTE — ED Notes (Signed)
Walked patient to the bathroom patient did great

## 2021-04-06 NOTE — Telephone Encounter (Signed)
Can you please make a consult visit with a sleep provider?

## 2021-04-06 NOTE — Progress Notes (Signed)
Pt admitted from ED to room 2W 31 for PE and DVT on heparin gtt. Pt oriented to unit. Call bell within reach. Care plan and education goals created. Care to be deferred to night shift.    04/06/21 1800  Vitals  Temp 98.5 F (36.9 C)  Temp Source Oral  BP 140/76  MAP (mmHg) 92  BP Location Left Arm  BP Method Automatic  Patient Position (if appropriate) Lying  Pulse Rate 68  Pulse Rate Source Monitor  Resp 20  Level of Consciousness  Level of Consciousness Alert  MEWS COLOR  MEWS Score Color Green  Oxygen Therapy  SpO2 99 %  O2 Device Room Air  Pain Assessment  Pain Scale 0-10  Pain Score 0  Glasgow Coma Scale  Eye Opening 4  Best Verbal Response (NON-intubated) 5  Best Motor Response 6  Glasgow Coma Scale Score 15  MEWS Score  MEWS Temp 0  MEWS Systolic 0  MEWS Pulse 0  MEWS RR 0  MEWS LOC 0  MEWS Score 0

## 2021-04-06 NOTE — ED Notes (Signed)
Breakfast Orders placed 

## 2021-04-07 ENCOUNTER — Other Ambulatory Visit (HOSPITAL_COMMUNITY): Payer: Self-pay

## 2021-04-07 DIAGNOSIS — I2699 Other pulmonary embolism without acute cor pulmonale: Secondary | ICD-10-CM | POA: Diagnosis not present

## 2021-04-07 LAB — BASIC METABOLIC PANEL
Anion gap: 6 (ref 5–15)
BUN: 13 mg/dL (ref 6–20)
CO2: 24 mmol/L (ref 22–32)
Calcium: 8.6 mg/dL — ABNORMAL LOW (ref 8.9–10.3)
Chloride: 108 mmol/L (ref 98–111)
Creatinine, Ser: 1.16 mg/dL (ref 0.61–1.24)
GFR, Estimated: >60 mL/min (ref >60)
Glucose, Bld: 104 mg/dL — ABNORMAL HIGH (ref 70–99)
Potassium: 3.7 mmol/L (ref 3.5–5.1)
Sodium: 138 mmol/L (ref 135–145)

## 2021-04-07 LAB — CBC
HCT: 45.6 % (ref 39.0–52.0)
Hemoglobin: 15 g/dL (ref 13.0–17.0)
MCH: 30.2 pg (ref 26.0–34.0)
MCHC: 32.9 g/dL (ref 30.0–36.0)
MCV: 91.9 fL (ref 80.0–100.0)
Platelets: 214 10*3/uL (ref 150–400)
RBC: 4.96 MIL/uL (ref 4.22–5.81)
RDW: 12.7 % (ref 11.5–15.5)
WBC: 10.2 10*3/uL (ref 4.0–10.5)
nRBC: 0 % (ref 0.0–0.2)

## 2021-04-07 LAB — HEPARIN LEVEL (UNFRACTIONATED): Heparin Unfractionated: 0.64 IU/mL (ref 0.30–0.70)

## 2021-04-07 LAB — HOMOCYSTEINE: Homocysteine: 13.3 umol/L (ref 0.0–14.5)

## 2021-04-07 NOTE — Progress Notes (Signed)
PROGRESS NOTE    James Jordan  WUJ:[id number] DOB: 03/05/1971 DOA: 04/05/2021 PCP: Pcp, No   Brief Narrative/Hospital Course:  James Jordan, 50 y.o. 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  In the ED borderline tachycardia, otherwise hemodynamically stable, CT angio positive for large volume pulmonary embolism bilateral with RV: LV ratio 1.1 1 implying right heart strain DVT study positive for left leg DVT femoral, peroneal and tibial vein, bedside echo with normal RV size severely elevated pulmonary artery pressure of 70 mmHg, pulmonary was consulted and advised heparin non-tPA protocol and patient was admitted.   Subjective: Seen this morning.  Resting comfortably alert awake oriented, has had no episode where he had to catch his breath doing well on room air.  No chest pain leg swelling improving Overnight no hypotension hemodynamically stable, on room air.   Routine CBC/BMP stable  Assessment & Plan:  Bilateral pulmonary embolism  left leg DVT femoral, peroneal and tibial vein Severely elevated pulmonary artery pressure of 70 mmHg in echo: Appreciate pulmonary input-no indication for thrombolytics and being managed with IV heparin., currently hemodynamic stable, on room air.  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 PCCM - planning for outpatient sleep apnea evaluation given pulmonary hypertension  Positive troponin suspect demand ischemia in the setting of deep PE: EF preserved on echo.  Troponin was downtrending.  No chest pain.    Rright eyelid MALT non-Hodgkin's  lymphoma status post RT in 2020 has been in remission since.  Class II Obesity:Patient's Body mass index is 35.91 kg/m. : Will benefit with PCP follow-up, weight loss  healthy lifestyle and outpatient sleep evaluation.  He has not had COVID-vaccine yet, encouraged to consider -given his PE/DVT and that COVID infection could cause further hyper coagulable state.  DVT prophylaxis:  Code Status:   Code Status: Full Code Family Communication: plan of care discussed with patient at bedside. Status is: Inpatient Remains inpatient appropriate because: For ongoing management of DVT and PE with IV heparin Currently not medically stable for discharge. Anticipate discharge in next 1 to 2 days  Objective: Vitals last 24 hrs: Vitals:   04/06/21 1300 04/06/21 1507 04/06/21 1800 04/07/21 0600  BP: (!) 115/59 (!) 146/84 140/76 135/88  Pulse: 73 72 68 (!) 59  Resp: 16 (!) 21 20 18   Temp:   98.5 F (36.9 C) 98 F (36.7 C)  TempSrc:   Oral Oral  SpO2: 95% 94% 99% 99%  Weight:      Height:       Weight change:   Intake/Output Summary (Last 24 hours) at 04/07/2021 9562 Last data filed at 04/07/2021 1308 Gross per 24 hour  Intake 957.72 ml  Output --  Net 957.72 ml    Net IO Since Admission: 1,956.72 mL [04/07/21 0807]   Physical Examination: General exam: AAOx 3,older than stated age, weak appearing. HEENT:Oral mucosa moist, Ear/Nose WNL grossly, dentition normal. Respiratory system: bilaterally clear breath sounds, no use of accessory muscle Cardiovascular system: S1 & S2 +, No JVD,. Gastrointestinal system: Abdomen soft, NT,ND, BS+ Nervous System:Alert, awake, moving extremities and grossly nonfocal  Extremities: Mild swelling in the left leg, distal peripheral pulses palpable.  Skin: No rashes,no icterus. MSK: Normal muscle bulk,tone, power   Medications reviewed:  Scheduled Meds: Continuous Infusions:  heparin 2,200 Units/hr (04/06/21 2148)   Diet Order             Diet  regular Room service appropriate? Yes; Fluid consistency: Thin  Diet effective now                          Weight change:   Wt Readings from Last 3 Encounters:  04/05/21 133.8 kg  09/12/20 129.3 kg  10/19/18 132.9 kg     Consultants:see note  Procedures:see note Antimicrobials: Anti-infectives (From admission, onward)    None      Culture/Microbiology No results found for: SDES, SPECREQUEST, CULT, REPTSTATUS  Other culture-see note  Unresulted Labs (From admission, onward)     Start     Ordered   04/07/21 2426  Basic metabolic panel  Daily,   R     Question:  Specimen collection method  Answer:  Lab=Lab collect   04/06/21 1200   04/07/21 0500  CBC  Daily,   R     Question:  Specimen collection method  Answer:  Lab=Lab collect   04/06/21 1200   04/06/21 0500  Heparin level (unfractionated)  Daily,   R      04/05/21 1144   04/05/21 1628  HIV Antibody (routine testing w rflx)  (HIV Antibody (Routine testing w reflex) panel)  Once,   R        04/05/21 1629   04/05/21 1215  Protein C activity  (Hypercoagulable Panel, Comprehensive (PNL))  Once,   STAT        04/05/21 1215   04/05/21 1215  Protein S activity  (Hypercoagulable Panel, Comprehensive (PNL))  Once,   STAT        04/05/21 1215   04/05/21 1215  Protein S, total  (Hypercoagulable Panel, Comprehensive (PNL))  Once,   STAT        04/05/21 1215   04/05/21 1215  Lupus anticoagulant panel  (Hypercoagulable Panel, Comprehensive (PNL))  Once,   STAT        04/05/21 1215   04/05/21 1215  Beta-2-glycoprotein i abs, IgG/M/A  (Hypercoagulable Panel, Comprehensive (PNL))  Once,   STAT        04/05/21 1215   04/05/21 1215  Homocysteine, serum  (Hypercoagulable Panel, Comprehensive (PNL))  Once,   STAT        04/05/21 1215   04/05/21 1215  Factor 5 leiden  (Hypercoagulable Panel, Comprehensive (PNL))  Once,   STAT        04/05/21 1215   04/05/21 1215  Prothrombin gene mutation  (Hypercoagulable Panel, Comprehensive  (PNL))  Once,   STAT        04/05/21 1215   04/05/21 1215  Cardiolipin antibodies, IgG, IgM, IgA  (Hypercoagulable Panel, Comprehensive (PNL))  Once,   STAT        04/05/21 1215          Data Reviewed: I have personally reviewed following labs and imaging studies CBC: Recent Labs  Lab 04/05/21 0829 04/06/21 0506 04/07/21 0148  WBC 10.6* 9.5 10.2  NEUTROABS 8.7*  --   --   HGB 16.3 15.1 15.0  HCT 49.8 45.3 45.6  MCV 92.7 91.9 91.9  PLT 226 191 834    Basic Metabolic Panel: Recent Labs  Lab 04/05/21  1696 04/07/21 0148  NA 138 138  K 4.1 3.7  CL 107 108  CO2 25 24  GLUCOSE 129* 104*  BUN 14 13  CREATININE 1.21 1.16  CALCIUM 8.9 8.6*    GFR: Estimated Creatinine Clearance: 116.3 mL/min (by C-G formula based on SCr of 1.16 mg/dL). Liver Function Tests: Recent Labs  Lab 04/05/21 0829  AST 18  ALT 22  ALKPHOS 59  BILITOT 1.1  PROT 6.8  ALBUMIN 3.9    No results for input(s): LIPASE, AMYLASE in the last 168 hours. No results for input(s): AMMONIA in the last 168 hours. Coagulation Profile: Recent Labs  Lab 04/05/21 1144  INR 1.3*    Cardiac Enzymes: No results for input(s): CKTOTAL, CKMB, CKMBINDEX, TROPONINI in the last 168 hours. BNP (last 3 results) No results for input(s): PROBNP in the last 8760 hours. HbA1C: No results for input(s): HGBA1C in the last 72 hours. CBG: No results for input(s): GLUCAP in the last 168 hours. Lipid Profile: No results for input(s): CHOL, HDL, LDLCALC, TRIG, CHOLHDL, LDLDIRECT in the last 72 hours. Thyroid Function Tests: No results for input(s): TSH, T4TOTAL, FREET4, T3FREE, THYROIDAB in the last 72 hours. Anemia Panel: No results for input(s): VITAMINB12, FOLATE, FERRITIN, TIBC, IRON, RETICCTPCT in the last 72 hours. Sepsis Labs: Recent Labs  Lab 04/05/21 1410  LATICACIDVEN 0.8     Recent Results (from the past 240 hour(s))  Resp Panel by RT-PCR (Flu A&B, Covid) Nasopharyngeal Swab     Status: None    Collection Time: 04/05/21 12:18 PM   Specimen: Nasopharyngeal Swab; Nasopharyngeal(NP) swabs in vial transport medium  Result Value Ref Range Status   SARS Coronavirus 2 by RT PCR NEGATIVE NEGATIVE Final    Comment: (NOTE) SARS-CoV-2 target nucleic acids are NOT DETECTED.  The SARS-CoV-2 RNA is generally detectable in upper respiratory specimens during the acute phase of infection. The lowest concentration of SARS-CoV-2 viral copies this assay can detect is 138 copies/mL. A negative result does not preclude SARS-Cov-2 infection and should not be used as the sole basis for treatment or other patient management decisions. A negative result may occur with  improper specimen collection/handling, submission of specimen other than nasopharyngeal swab, presence of viral mutation(s) within the areas targeted by this assay, and inadequate number of viral copies(<138 copies/mL). A negative result must be combined with clinical observations, patient history, and epidemiological information. The expected result is Negative.  Fact Sheet for Patients:  EntrepreneurPulse.com.au  Fact Sheet for Healthcare Providers:  IncredibleEmployment.be  This test is no t yet approved or cleared by the [place] FDA and  has been authorized for detection and/or diagnosis of SARS-CoV-2 by FDA under an Emergency Use Authorization (EUA). This EUA will remain  in effect (meaning this test can be used) for the duration of the COVID-19 declaration under Section 564(b)(1) of the Act, 21 U.S.C.section 360bbb-3(b)(1), unless the authorization is terminated  or revoked sooner.       Influenza A by PCR NEGATIVE NEGATIVE Final   Influenza B by PCR NEGATIVE NEGATIVE Final    Comment: (NOTE) The Xpert Xpress SARS-CoV-2/FLU/RSV plus assay is intended as an aid in the diagnosis of influenza from Nasopharyngeal swab specimens and should not be used as a sole basis for treatment.  Nasal washings and aspirates are unacceptable for Xpert Xpress SARS-CoV-2/FLU/RSV testing.  Fact Sheet for Patients: EntrepreneurPulse.com.au  Fact Sheet for Healthcare Providers: IncredibleEmployment.be  This test is not yet approved or cleared by the [place] and  has been authorized for detection and/or diagnosis of SARS-CoV-2 by FDA under an Emergency Use Authorization (EUA). This EUA will remain in effect (meaning this test can be used) for the duration of the COVID-19 declaration under Section 564(b)(1) of the Act, 21 U.S.C. section 360bbb-3(b)(1), unless the authorization is terminated or revoked.  Performed at [facility], [place] [address], [place], [place] [address]       Radiology Studies: CT Angio Chest PE W and/or Wo Contrast  Result Date: 04/05/2021 CLINICAL DATA:  Shortness of breath with tachycardia on awakening today. Intermittent left calf pain for 3 days with swelling. Clinical concern for pulmonary embolism. EXAM: CT ANGIOGRAPHY CHEST WITH CONTRAST TECHNIQUE: Multidetector CT imaging of the chest was performed using the standard protocol during bolus administration of intravenous contrast. Multiplanar CT image reconstructions and MIPs were obtained to evaluate the vascular anatomy. CONTRAST:  134mL OMNIPAQUE IOHEXOL 350 MG/ML SOLN COMPARISON:  Chest radiographs 10/19/2018. FINDINGS: Cardiovascular: The pulmonary arteries are well opacified with contrast to the level of the subsegmental branches. Study is positive for extensive acute pulmonary thromboembolic disease bilaterally with nearly occlusive lobar and segmental branch emboli bilaterally. There is occlusive thrombus within the right lower lobe segmental and subsegmental branches. No evidence of thrombus within the main pulmonary artery, right ventricle or right atrium. No significant systemic arterial abnormalities are identified. There is possible right heart strain  with dilatation of the right ventricle relative to the right atrium (4.3 versus 3.9 cm). The heart size is normal. There is no pericardial effusion. Mediastinum/Nodes: There are no enlarged mediastinal, hilar or axillary lymph nodes. The thyroid gland, trachea and esophagus demonstrate no significant findings. Lungs/Pleura: No pleural effusion or pneumothorax. The lungs are essentially clear, without evidence of pulmonary infarct. There is minimal atelectasis in the lingula. Upper abdomen: Hepatic low density consistent with steatosis. No focal or acute abnormality identified. Probable small cyst anteriorly in the mid right kidney. Musculoskeletal/Chest wall: There is no chest wall mass or suspicious osseous finding. Review of the MIP images confirms the above findings. IMPRESSION: 1. Large volume of pulmonary emboli in the lungs bilaterally, with dilatation of the right atrium and right ventricle (RV to LV ratio of 1.1) indicative of elevated right-sided heart pressures and right heart strain. These findings have been shown to be associated with a increased morbidity and mortality in the setting pulmonary embolism. 2. No evidence of pulmonary infarct or significant pleural effusion. 3. Hepatic steatosis. 4. Critical Value/emergent results were called by telephone at the time of interpretation on 04/05/2021 at 1:15 pm to provider JULIE HAVILAND , who verbally acknowledged these results. Electronically Signed   By: Richardean Sale M.D.   On: 04/05/2021 13:21   ECHOCARDIOGRAM COMPLETE  Result Date: 04/05/2021    ECHOCARDIOGRAM REPORT   Patient Name:   James Jordan Date of Exam: 04/05/2021 Medical Rec #:  [id number]        Height:       76.0 in Accession #:    [id number]       Weight:       295.0 lb Date of Birth:  08/31/1970        BSA:          2.613 m Patient Age:    36 years         BP:           149/96 mmHg Patient Gender: M  HR:           100 bpm. Exam Location:  Inpatient Procedure: 2D  Echo, 3D Echo, Cardiac Doppler and Color Doppler                STAT ECHO  Reported to Dr. Gilford Raid and Dr. Carlis Abbott. Indications:    I26.02 Pulmonary embolus  History:        Patient has no prior history of Echocardiogram examinations.                 Abnormal ECG, Arrythmias:PAC; Signs/Symptoms:Shortness of Breath                 and Dyspnea.  Sonographer:    Roseanna Rainbow RDCS Referring Phys: [id number] GRACE E BOWSER  Sonographer Comments: Technically difficult study due to poor echo windows. Image acquisition challenging due to patient body habitus. IMPRESSIONS  1. Left ventricular ejection fraction, by estimation, is 55 to 60%. The left ventricle has normal function. The left ventricle has no regional wall motion abnormalities. Left ventricular diastolic parameters were normal.  2. Right ventricular systolic function is normal. The right ventricular size is normal. There is severely elevated pulmonary artery systolic pressure. The estimated right ventricular systolic pressure is 31.4 mmHg.  3. Right atrial size was mildly dilated.  4. The mitral valve is normal in structure. Trivial mitral valve regurgitation. No evidence of mitral stenosis.  5. The aortic valve is grossly normal. Aortic valve regurgitation is not visualized. No aortic stenosis is present.  6. The inferior vena cava is dilated in size with >50% respiratory variability, suggesting right atrial pressure of 8 mmHg. Comparison(s): No prior Echocardiogram. Conclusion(s)/Recommendation(s): Severely elevated pulmonary pressures, with preservation of RV size/function. FINDINGS  Left Ventricle: Left ventricular ejection fraction, by estimation, is 55 to 60%. The left ventricle has normal function. The left ventricle has no regional wall motion abnormalities. The left ventricular internal cavity size was normal in size. There is  borderline left ventricular hypertrophy. Left ventricular diastolic parameters were normal. Right Ventricle: The right ventricular size  is normal. No increase in right ventricular wall thickness. Right ventricular systolic function is normal. There is severely elevated pulmonary artery systolic pressure. The tricuspid regurgitant velocity is 3.94 m/s, and with an assumed right atrial pressure of 8 mmHg, the estimated right ventricular systolic pressure is 97.0 mmHg. Left Atrium: Left atrial size was normal in size. Right Atrium: Right atrial size was mildly dilated. Pericardium: There is no evidence of pericardial effusion. Mitral Valve: The mitral valve is normal in structure. Trivial mitral valve regurgitation. No evidence of mitral valve stenosis. Tricuspid Valve: The tricuspid valve is normal in structure. Tricuspid valve regurgitation is mild . No evidence of tricuspid stenosis. Aortic Valve: The aortic valve is grossly normal. Aortic valve regurgitation is not visualized. No aortic stenosis is present. Aortic valve peak gradient measures 52.4 mmHg. Pulmonic Valve: The pulmonic valve was not well visualized. Pulmonic valve regurgitation is not visualized. No evidence of pulmonic stenosis. Aorta: The aortic root, ascending aorta and aortic arch are all structurally normal, with no evidence of dilitation or obstruction. Venous: The inferior vena cava is dilated in size with greater than 50% respiratory variability, suggesting right atrial pressure of 8 mmHg. IAS/Shunts: The atrial septum is grossly normal.  LEFT VENTRICLE PLAX 2D LVIDd:         5.00 cm      Diastology LVIDs:         3.50 cm  LV e' medial:    12.90 cm/s LV PW:         1.20 cm      LV E/e' medial:  4.9 LV IVS:        0.90 cm      LV e' lateral:   15.20 cm/s LVOT diam:     2.40 cm      LV E/e' lateral: 4.2 LV SV:         76 LV SV Index:   29 LVOT Area:     4.52 cm                              3D Volume EF: LV Volumes (MOD)            3D EF:        56 % LV vol d, MOD A2C: 118.0 ml LV EDV:       276 ml LV vol d, MOD A4C: 82.5 ml  LV ESV:       121 ml LV vol s, MOD A2C: 33.7 ml   LV SV:        155 ml LV vol s, MOD A4C: 38.8 ml LV SV MOD A2C:     84.3 ml LV SV MOD A4C:     82.5 ml LV SV MOD BP:      62.5 ml RIGHT VENTRICLE             IVC RV S prime:     15.10 cm/s  IVC diam: 2.60 cm TAPSE (M-mode): 1.8 cm LEFT ATRIUM             Index        RIGHT ATRIUM           Index LA diam:        3.60 cm 1.38 cm/m   RA Area:     22.10 cm LA Vol (A2C):   43.2 ml 16.53 ml/m  RA Volume:   64.00 ml  24.49 ml/m LA Vol (A4C):   29.4 ml 11.25 ml/m LA Biplane Vol: 35.4 ml 13.55 ml/m  AORTIC VALVE AV Area (Vmax): 1.31 cm AV Vmax:        362.00 cm/s AV Peak Grad:   52.4 mmHg LVOT Vmax:      105.00 cm/s LVOT Vmean:     69.700 cm/s LVOT VTI:       0.169 m  AORTA Ao Root diam: 3.60 cm Ao Asc diam:  3.60 cm MITRAL VALVE               TRICUSPID VALVE MV Area (PHT): 5.13 cm    TR Peak grad:   62.1 mmHg MV Decel Time: 148 msec    TR Vmax:        394.00 cm/s MV E velocity: 63.20 cm/s MV A velocity: 70.60 cm/s  SHUNTS MV E/A ratio:  0.90        Systemic VTI:  0.17 m                            Systemic Diam: 2.40 cm Buford Dresser MD Electronically signed by Buford Dresser MD Signature Date/Time: 04/05/2021/3:24:21 PM    Final    VAS [place] LOWER EXTREMITY VENOUS (DVT) (ONLY [facility] & [facility])  Result Date: 04/05/2021  Lower Venous DVT Study Patient Name:  James Jordan  Date of Exam:  04/05/2021 Medical Rec #: [id number]         Accession #:    [id number] Date of Birth: 1971-03-10         Patient Gender: M Patient Age:   44 years Exam Location:  [facility] Procedure:      VAS [place] LOWER EXTREMITY VENOUS (DVT) Referring Phys: RILEY RANSOM --------------------------------------------------------------------------------  Indications: Swelling, and Edema.  Performing Technologist: Archie Patten RVS  Examination Guidelines: A complete evaluation includes B-mode imaging, spectral Doppler, color Doppler, and power Doppler as needed of all accessible portions of each vessel. Bilateral testing is  considered an integral part of a complete examination. Limited examinations for reoccurring indications may be performed as noted. The reflux portion of the exam is performed with the patient in reverse Trendelenburg.  +-----+---------------+---------+-----------+----------+--------------+ RIGHTCompressibilityPhasicitySpontaneityPropertiesThrombus Aging +-----+---------------+---------+-----------+----------+--------------+ CFV  Full           Yes      Yes                                 +-----+---------------+---------+-----------+----------+--------------+   +---------+---------------+---------+-----------+----------+-----------------+ LEFT     CompressibilityPhasicitySpontaneityPropertiesThrombus Aging    +---------+---------------+---------+-----------+----------+-----------------+ CFV      Full           Yes      Yes                                    +---------+---------------+---------+-----------+----------+-----------------+ SFJ      Full                                                           +---------+---------------+---------+-----------+----------+-----------------+ FV Prox  Full                                                           +---------+---------------+---------+-----------+----------+-----------------+ FV Mid   Full                                                           +---------+---------------+---------+-----------+----------+-----------------+ FV DistalNone                                         Age Indeterminate +---------+---------------+---------+-----------+----------+-----------------+ PFV      Full                                                           +---------+---------------+---------+-----------+----------+-----------------+ POP      None           No       No  Acute             +---------+---------------+---------+-----------+----------+-----------------+ PTV      None                                          Age Indeterminate +---------+---------------+---------+-----------+----------+-----------------+ PERO     Partial                                      Age Indeterminate +---------+---------------+---------+-----------+----------+-----------------+     Summary: RIGHT: - No evidence of common femoral vein obstruction.  LEFT: - Findings consistent with acute deep vein thrombosis involving the left popliteal vein. - Findings consistent with age indeterminate deep vein thrombosis involving the left femoral vein, left peroneal veins, and left posterior tibial veins.  *See table(s) above for measurements and observations. Electronically signed by Deitra Mayo MD on 04/05/2021 at 1:31:34 PM.    Final      LOS: 2 days   Antonieta Pert, MD [facility]  04/07/2021, 8:07 AM

## 2021-04-07 NOTE — TOC Benefit Eligibility Note (Signed)
Patient Advocate Encounter  Prior Authorization for Eliquis 5 mg has been approved.    PA# [id number] Effective dates: 04/07/2021 through 04/07/2022  Patients co-pay is $164.60 due to a $75.00 deductible remaining.     Lyndel Safe, [place] Patient Advocate Specialist [place] [facility] Stewardship Team Direct Number: [phone]  Fax: [phone]

## 2021-04-07 NOTE — Progress Notes (Signed)
ANTICOAGULATION CONSULT NOTE  Pharmacy Consult for Heparin Indication: DVT/PE  No Known Allergies  Patient Measurements: Height: 6\' 4"  (193 cm) Weight: 133.8 kg (295 lb) IBW/kg (Calculated) : 86.8 Heparin Dosing Weight: 116.1 kg  Vital Signs: Temp: 98 F (36.7 C) (10/25 0600) Temp Source: Oral (10/25 0600) BP: 135/88 (10/25 0600) Pulse Rate: 59 (10/25 0600)  Labs: Recent Labs    04/05/21 0829 04/05/21 1144 04/05/21 1319 04/05/21 1900 04/06/21 0506 04/06/21 1232 04/07/21 0148  HGB 16.3  --   --   --  15.1  --  15.0  HCT 49.8  --   --   --  45.3  --  45.6  PLT 226  --   --   --  191  --  214  LABPROT  --  15.7*  --   --   --   --   --   INR  --  1.3*  --   --   --   --   --   HEPARINUNFRC  --   --   --    < > <0.10* 0.38 0.64  CREATININE 1.21  --   --   --   --   --  1.16  TROPONINIHS 50*  --  194*  --  78*  --   --    < > = values in this interval not displayed.     Estimated Creatinine Clearance: 116.3 mL/min (by C-G formula based on SCr of 1.16 mg/dL).   Medical History: Past Medical History:  Diagnosis Date   ACL tear - left knee 2002   Per patient, not repaired    Umbilical hernia [id number]    Medications:  No medications prior to admission.    Scheduled:  Infusions:   heparin 2,200 Units/hr (04/06/21 2148)   PRN:   Assessment: 43 yom presenting with shortness of breath and palpitations this morning. Patient denies history of PE/DVTs. Heparin per pharmacy consult placed for  VTE Treatment . Patient is on not on anticoagulation prior to arrival.  [place] this morning with findings of DVT. CT confirmed submassive bilateral PE w/o RV strain  Hgb 16.3; plt 226 D-Dimer 17.35  Heparin level therapeutic  Goal of Therapy:  Heparin level 0.3-0.7 units/ml Monitor platelets by anticoagulation protocol: Yes   Plan:  Continue heparin gtt at 2200 units/hr Daily heparin level, CBC, s/s bleeding F/u long term AC plan and ability to transition to PO  Thank  you Anette Guarneri, PharmD 04/07/2021 7:57 AM

## 2021-04-07 NOTE — TOC Benefit Eligibility Note (Signed)
Patient Teacher, English as a foreign language completed.    The patient is currently admitted and upon discharge could be taking Eliquis 5 mg.  Requires Prior Authorization  The patient is currently admitted and upon discharge could be taking Xarelto 20 mg.  Requires Prior Authorization  The patient is insured through [place], [place] Patient Advocate Specialist [place] Team Direct Number: [phone]  Fax: [phone]

## 2021-04-08 ENCOUNTER — Other Ambulatory Visit (HOSPITAL_COMMUNITY): Payer: Self-pay

## 2021-04-08 DIAGNOSIS — I2699 Other pulmonary embolism without acute cor pulmonale: Secondary | ICD-10-CM | POA: Diagnosis not present

## 2021-04-08 DIAGNOSIS — I82432 Acute embolism and thrombosis of left popliteal vein: Secondary | ICD-10-CM | POA: Diagnosis not present

## 2021-04-08 LAB — CBC
HCT: 44.4 % (ref 39.0–52.0)
Hemoglobin: 14.4 g/dL (ref 13.0–17.0)
MCH: 29.9 pg (ref 26.0–34.0)
MCHC: 32.4 g/dL (ref 30.0–36.0)
MCV: 92.1 fL (ref 80.0–100.0)
Platelets: 218 10*3/uL (ref 150–400)
RBC: 4.82 MIL/uL (ref 4.22–5.81)
RDW: 12.7 % (ref 11.5–15.5)
WBC: 10.3 10*3/uL (ref 4.0–10.5)
nRBC: 0 % (ref 0.0–0.2)

## 2021-04-08 LAB — BASIC METABOLIC PANEL
Anion gap: 6 (ref 5–15)
BUN: 12 mg/dL (ref 6–20)
CO2: 27 mmol/L (ref 22–32)
Calcium: 8.8 mg/dL — ABNORMAL LOW (ref 8.9–10.3)
Chloride: 105 mmol/L (ref 98–111)
Creatinine, Ser: 1.25 mg/dL — ABNORMAL HIGH (ref 0.61–1.24)
GFR, Estimated: >60 mL/min (ref >60)
Glucose, Bld: 111 mg/dL — ABNORMAL HIGH (ref 70–99)
Potassium: 3.8 mmol/L (ref 3.5–5.1)
Sodium: 138 mmol/L (ref 135–145)

## 2021-04-08 LAB — DRVVT CONFIRM: dRVVT Confirm: 1.1 ratio (ref 0.8–1.2)

## 2021-04-08 LAB — PROTEIN S ACTIVITY: Protein S Activity: 129 % (ref 63–140)

## 2021-04-08 LAB — PROTEIN S, TOTAL: Protein S Ag, Total: 114 % (ref 60–150)

## 2021-04-08 LAB — CARDIOLIPIN ANTIBODIES, IGG, IGM, IGA
Anticardiolipin IgA: <9 APL U/mL (ref 0–11)
Anticardiolipin IgG: <9 GPL U/mL (ref 0–14)
Anticardiolipin IgM: 18 MPL U/mL — ABNORMAL HIGH (ref 0–12)

## 2021-04-08 LAB — BETA-2-GLYCOPROTEIN I ABS, IGG/M/A
Beta-2 Glyco I IgG: <9 GPI IgG units (ref 0–20)
Beta-2-Glycoprotein I IgA: <9 GPI IgA units (ref 0–25)
Beta-2-Glycoprotein I IgM: 12 GPI IgM units (ref 0–32)

## 2021-04-08 LAB — LUPUS ANTICOAGULANT PANEL
DRVVT: 48.3 s — ABNORMAL HIGH (ref 0.0–47.0)
PTT Lupus Anticoagulant: 47.9 s (ref 0.0–51.9)

## 2021-04-08 LAB — HEPARIN LEVEL (UNFRACTIONATED): Heparin Unfractionated: 0.65 IU/mL (ref 0.30–0.70)

## 2021-04-08 LAB — DRVVT MIX: dRVVT Mix: 43.8 s — ABNORMAL HIGH (ref 0.0–40.4)

## 2021-04-08 LAB — PROTEIN C ACTIVITY: Protein C Activity: 114 % (ref 73–180)

## 2021-04-08 MED ORDER — APIXABAN 5 MG PO TABS: 5.0000 mg | ORAL_TABLET | Freq: Two times a day (BID) | ORAL | Status: DC

## 2021-04-08 MED ORDER — APIXABAN 5 MG PO TABS
10.0000 mg | ORAL_TABLET | Freq: Two times a day (BID) | ORAL | Status: DC
  Administered 2021-04-08: 10 mg via ORAL
  Filled 2021-04-08: qty 2

## 2021-04-08 MED ORDER — APIXABAN (ELIQUIS) VTE STARTER PACK (10MG AND 5MG)
ORAL_TABLET | ORAL | 0 refills | Status: DC
  Filled 2021-04-08: qty 74, 30d supply, fill #0

## 2021-04-08 MED ORDER — APIXABAN 5 MG PO TABS: 5.0000 mg | ORAL_TABLET | Freq: Two times a day (BID) | ORAL | 3 refills | Status: DC

## 2021-04-08 NOTE — Discharge Instructions (Signed)

## 2021-04-08 NOTE — Progress Notes (Signed)
AVS printed and reviewed with pt. Pt able to verbalize discharge instructions. Medication teaching completed. PIV removed site without complications. Prescriptions received by pt and remaining sent to local pharmacy. Wife participated in discharge teaching. No belongings left at bedside.

## 2021-04-08 NOTE — Plan of Care (Signed)
Pt is independent and medically stable. Heparin gtt stopped and pt transitioned to Eliquis PO. Pt ambulated in hall x3 on Room Air without any complications. Wife involved in care. No concerns at this time. Pt likely to be discharged tomorrow.

## 2021-04-08 NOTE — Progress Notes (Addendum)
ANTICOAGULATION CONSULT NOTE  Pharmacy Consult for Heparin to Eliquis Indication: DVT/PE  No Known Allergies  Patient Measurements: Height: 6\' 4"  (193 cm) Weight: 133.8 kg (295 lb) IBW/kg (Calculated) : 86.8 Heparin Dosing Weight: 116.1 kg  Vital Signs: Temp: 97.8 F (36.6 C) (10/26 0547) Temp Source: Oral (10/26 0547) BP: 130/80 (10/26 0547) Pulse Rate: 53 (10/26 0547)  Labs: Recent Labs    04/05/21 1144 04/05/21 1319 04/05/21 1900 04/06/21 0506 04/06/21 1232 04/07/21 0148 04/08/21 0350  HGB  --   --    < > 15.1  --  15.0 14.4  HCT  --   --   --  45.3  --  45.6 44.4  PLT  --   --   --  191  --  214 218  LABPROT 15.7*  --   --   --   --   --   --   INR 1.3*  --   --   --   --   --   --   HEPARINUNFRC  --   --    < > <0.10* 0.38 0.64 0.65  CREATININE  --   --   --   --   --  1.16 1.25*  TROPONINIHS  --  194*  --  78*  --   --   --    < > = values in this interval not displayed.     Estimated Creatinine Clearance: 107.9 mL/min (A) (by C-G formula based on SCr of 1.25 mg/dL (H)).   Medical History: Past Medical History:  Diagnosis Date   ACL tear - left knee 2002   Per patient, not repaired    Umbilical hernia [id number]    Medications:  No medications prior to admission.    Scheduled:  Infusions:   heparin 2,200 Units/hr (04/08/21 0703)   PRN:   Assessment: 52 yom presenting with shortness of breath and palpitations this morning. Patient denies history of PE/DVTs. Heparin per pharmacy consult placed for  VTE Treatment . Patient is on not on anticoagulation prior to arrival.  [place] this morning with findings of DVT. CT confirmed submassive bilateral PE w/o RV strain  Hgb 16.3; plt 226 D-Dimer 17.35  Heparin level therapeutic this AM  Goal of Therapy:  Heparin level 0.3-0.7 units/ml Monitor platelets by anticoagulation protocol: Yes   Plan:  Continue heparin gtt at 2200 units/hr Daily heparin level, CBC, s/s bleeding To Eliquis soon  Thank  you Anette Guarneri, PharmD 04/08/2021 8:37 AM  9:15 AM - > Transitioning to Eliquis - 10 mg po BID x 7 days then 5 mg po BID Thank you Anette Guarneri, PharmD

## 2021-04-08 NOTE — Discharge Summary (Addendum)
PATIENT DETAILS Name: James Jordan Age: 50 y.o. Sex: male Date of Birth: May 09, 1971 MRN: [id number]. Admitting Physician: Lequita Halt, MD PCP:Pcp, No  Admit Date: 04/05/2021 Discharge date: 04/08/2021  Recommendations for Outpatient Follow-up:  Follow up with PCP in 1-2 weeks Please obtain CMP/CBC in one week Please ensure follow-up with [facility] oncology/hematology Hypercoagulable work-up in process-please follow.  Admitted From:  Home  Disposition: [place]: No  Equipment/Devices: None  Discharge Condition: Stable  CODE STATUS: FULL CODE  Diet recommendation:  Diet Order             Diet general           Diet regular Room service appropriate? Yes; Fluid consistency: Thin  Diet effective now                    Brief Summary: See H&P, Labs, Consult and Test reports for all details in brief, patient is a 50 year old man with history of right eyelid MALT non-Hodgkin's lymphoma-in remission-who presented with left calf pain/swelling and shortness of breath.  He was found to have left leg DVT and bilateral pulmonary embolism-he was started on IV heparin-and admitted to the hospitalist service.  See below for further details.  Brief Hospital Course: Bilateral pulmonary embolism with left leg DVT: Not hypoxic-managed with IV heparin-subsequently transitioned to Eliquis.  He has ambulated in the hallway-without any major issues.  He is aware that he will need evaluation by his oncologist at [facility] to ensure that his lymphoma is in remission-and probably will need hypercoagulable work-up as this venous thromboembolism was likely unprovoked.  Pulmonary hypertension: Seen on echo-likely due to VTE-continue anticoagulation-will need repeat echocardiogram in the next few months.  Minimally elevated troponin: Likely due to PE/RV strain.  EF preserved on echo.  Right eyelid MALT non-Hodgkin's lymphoma: Have asked patient to ensure that he gets a follow-up  with his oncologist at [facility] above.  Obesity: Estimated body mass index is 35.91 kg/m as calculated from the following:   Height as of this encounter: 6\' 4"  (1.93 m).   Weight as of this encounter: 133.8 kg.    Procedures None  Discharge Diagnoses:  Active Problems:   Bilateral pulmonary embolism [facility])   Discharge Instructions:  Activity:  As tolerated    Discharge Instructions     Call MD for:  difficulty breathing, headache or visual disturbances   Complete by: As directed    Diet general   Complete by: As directed    Discharge instructions   Complete by: As directed    Follow with Primary MD in 1-2 weeks  Please get a complete blood count and chemistry panel checked by your Primary MD at your next visit, and again as instructed by your Primary MD.  Get Medicines reviewed and adjusted: Please take all your medications with you for your next visit with your Primary MD  Laboratory/radiological data: Please request your Primary MD to go over all hospital tests and procedure/radiological results at the follow up, please ask your Primary MD to get all Hospital records sent to his/her office.  In some cases, they will be blood work, cultures and biopsy results pending at the time of your discharge. Please request that your primary care M.D. follows up on these results.  Also Note the following: If you experience worsening of your admission symptoms, develop shortness of breath, life threatening emergency, suicidal or homicidal thoughts you must seek medical attention immediately by calling 911 or  calling your MD immediately  if symptoms less severe.  You must read complete instructions/literature along with all the possible adverse reactions/side effects for all the Medicines you take and that have been prescribed to you. Take any new Medicines after you have completely understood and accpet all the possible adverse reactions/side effects.   Do not drive when taking  Pain medications or sleeping medications (Benzodaizepines)  Do not take more than prescribed Pain, Sleep and Anxiety Medications. It is not advisable to combine anxiety,sleep and pain medications without talking with your primary care practitioner  Special Instructions: If you have smoked or chewed Tobacco  in the last 2 yrs please stop smoking, stop any regular Alcohol  and or any Recreational drug use.  Wear Seat belts while driving.  Please note: You were cared for by a hospitalist during your hospital stay. Once you are discharged, your primary care physician will handle any further medical issues. Please note that NO REFILLS for any discharge medications will be authorized once you are discharged, as it is imperative that you return to your primary care physician (or establish a relationship with a primary care physician if you do not have one) for your post hospital discharge needs so that they can reassess your need for medications and monitor your lab values.   Increase activity slowly   Complete by: As directed       Allergies as of 04/08/2021   No Known Allergies      Medication List     TAKE these medications    Eliquis DVT/PE Starter Pack Generic drug: Apixaban Starter Pack (10mg  and 5mg ) Take as directed on package: start with two-5mg  tablets twice daily for 7 days. On day 8, switch to one-5mg  tablet twice daily.   apixaban 5 MG Tabs tablet Commonly known as: ELIQUIS Take 1 tablet (5 mg total) by mouth 2 (two) times daily. To begin after Eliquis starter pack complete        Follow-up Information     Primary care MD. Schedule an appointment as soon as possible for a visit in 2 week(s).          Your primary oncologist at [facility]. Schedule an appointment as soon as possible for a visit in 2 week(s).                 No Known Allergies    Consultations:  pulmonary/intensive care   Other Procedures/Studies: CT Angio Chest PE W and/or Wo  Contrast  Result Date: 04/05/2021 CLINICAL DATA:  Shortness of breath with tachycardia on awakening today. Intermittent left calf pain for 3 days with swelling. Clinical concern for pulmonary embolism. EXAM: CT ANGIOGRAPHY CHEST WITH CONTRAST TECHNIQUE: Multidetector CT imaging of the chest was performed using the standard protocol during bolus administration of intravenous contrast. Multiplanar CT image reconstructions and MIPs were obtained to evaluate the vascular anatomy. CONTRAST:  147mL OMNIPAQUE IOHEXOL 350 MG/ML SOLN COMPARISON:  Chest radiographs 10/19/2018. FINDINGS: Cardiovascular: The pulmonary arteries are well opacified with contrast to the level of the subsegmental branches. Study is positive for extensive acute pulmonary thromboembolic disease bilaterally with nearly occlusive lobar and segmental branch emboli bilaterally. There is occlusive thrombus within the right lower lobe segmental and subsegmental branches. No evidence of thrombus within the main pulmonary artery, right ventricle or right atrium. No significant systemic arterial abnormalities are identified. There is possible right heart strain with dilatation of the right ventricle relative to the right atrium (4.3 versus 3.9 cm). The heart  size is normal. There is no pericardial effusion. Mediastinum/Nodes: There are no enlarged mediastinal, hilar or axillary lymph nodes. The thyroid gland, trachea and esophagus demonstrate no significant findings. Lungs/Pleura: No pleural effusion or pneumothorax. The lungs are essentially clear, without evidence of pulmonary infarct. There is minimal atelectasis in the lingula. Upper abdomen: Hepatic low density consistent with steatosis. No focal or acute abnormality identified. Probable small cyst anteriorly in the mid right kidney. Musculoskeletal/Chest wall: There is no chest wall mass or suspicious osseous finding. Review of the MIP images confirms the above findings. IMPRESSION: 1. Large volume of  pulmonary emboli in the lungs bilaterally, with dilatation of the right atrium and right ventricle (RV to LV ratio of 1.1) indicative of elevated right-sided heart pressures and right heart strain. These findings have been shown to be associated with a increased morbidity and mortality in the setting pulmonary embolism. 2. No evidence of pulmonary infarct or significant pleural effusion. 3. Hepatic steatosis. 4. Critical Value/emergent results were called by telephone at the time of interpretation on 04/05/2021 at 1:15 pm to provider JULIE HAVILAND , who verbally acknowledged these results. Electronically Signed   By: Richardean Sale M.D.   On: 04/05/2021 13:21   ECHOCARDIOGRAM COMPLETE  Result Date: 04/05/2021    ECHOCARDIOGRAM REPORT   Patient Name:   JAMIE BELGER Date of Exam: 04/05/2021 Medical Rec #:  [id number]        Height:       76.0 in Accession #:    [id number]       Weight:       295.0 lb Date of Birth:  1971-03-07        BSA:          2.613 m Patient Age:    36 years         BP:           149/96 mmHg Patient Gender: M                HR:           100 bpm. Exam Location:  Inpatient Procedure: 2D Echo, 3D Echo, Cardiac Doppler and Color Doppler                STAT ECHO  Reported to Dr. Gilford Jordan and Dr. Carlis Abbott. Indications:    I26.02 Pulmonary embolus  History:        Patient has no prior history of Echocardiogram examinations.                 Abnormal ECG, Arrythmias:PAC; Signs/Symptoms:Shortness of Breath                 and Dyspnea.  Sonographer:    Roseanna Rainbow RDCS Referring Phys: [id number] GRACE E BOWSER  Sonographer Comments: Technically difficult study due to poor echo windows. Image acquisition challenging due to patient body habitus. IMPRESSIONS  1. Left ventricular ejection fraction, by estimation, is 55 to 60%. The left ventricle has normal function. The left ventricle has no regional wall motion abnormalities. Left ventricular diastolic parameters were normal.  2. Right ventricular systolic  function is normal. The right ventricular size is normal. There is severely elevated pulmonary artery systolic pressure. The estimated right ventricular systolic pressure is 01.6 mmHg.  3. Right atrial size was mildly dilated.  4. The mitral valve is normal in structure. Trivial mitral valve regurgitation. No evidence of mitral stenosis.  5. The aortic valve is grossly normal. Aortic valve regurgitation is not visualized.  No aortic stenosis is present.  6. The inferior vena cava is dilated in size with >50% respiratory variability, suggesting right atrial pressure of 8 mmHg. Comparison(s): No prior Echocardiogram. Conclusion(s)/Recommendation(s): Severely elevated pulmonary pressures, with preservation of RV size/function. FINDINGS  Left Ventricle: Left ventricular ejection fraction, by estimation, is 55 to 60%. The left ventricle has normal function. The left ventricle has no regional wall motion abnormalities. The left ventricular internal cavity size was normal in size. There is  borderline left ventricular hypertrophy. Left ventricular diastolic parameters were normal. Right Ventricle: The right ventricular size is normal. No increase in right ventricular wall thickness. Right ventricular systolic function is normal. There is severely elevated pulmonary artery systolic pressure. The tricuspid regurgitant velocity is 3.94 m/s, and with an assumed right atrial pressure of 8 mmHg, the estimated right ventricular systolic pressure is 90.2 mmHg. Left Atrium: Left atrial size was normal in size. Right Atrium: Right atrial size was mildly dilated. Pericardium: There is no evidence of pericardial effusion. Mitral Valve: The mitral valve is normal in structure. Trivial mitral valve regurgitation. No evidence of mitral valve stenosis. Tricuspid Valve: The tricuspid valve is normal in structure. Tricuspid valve regurgitation is mild . No evidence of tricuspid stenosis. Aortic Valve: The aortic valve is grossly normal.  Aortic valve regurgitation is not visualized. No aortic stenosis is present. Aortic valve peak gradient measures 52.4 mmHg. Pulmonic Valve: The pulmonic valve was not well visualized. Pulmonic valve regurgitation is not visualized. No evidence of pulmonic stenosis. Aorta: The aortic root, ascending aorta and aortic arch are all structurally normal, with no evidence of dilitation or obstruction. Venous: The inferior vena cava is dilated in size with greater than 50% respiratory variability, suggesting right atrial pressure of 8 mmHg. IAS/Shunts: The atrial septum is grossly normal.  LEFT VENTRICLE PLAX 2D LVIDd:         5.00 cm      Diastology LVIDs:         3.50 cm      LV e' medial:    12.90 cm/s LV PW:         1.20 cm      LV E/e' medial:  4.9 LV IVS:        0.90 cm      LV e' lateral:   15.20 cm/s LVOT diam:     2.40 cm      LV E/e' lateral: 4.2 LV SV:         76 LV SV Index:   29 LVOT Area:     4.52 cm                              3D Volume EF: LV Volumes (MOD)            3D EF:        56 % LV vol d, MOD A2C: 118.0 ml LV EDV:       276 ml LV vol d, MOD A4C: 82.5 ml  LV ESV:       121 ml LV vol s, MOD A2C: 33.7 ml  LV SV:        155 ml LV vol s, MOD A4C: 38.8 ml LV SV MOD A2C:     84.3 ml LV SV MOD A4C:     82.5 ml LV SV MOD BP:      62.5 ml RIGHT VENTRICLE  IVC RV S prime:     15.10 cm/s  IVC diam: 2.60 cm TAPSE (M-mode): 1.8 cm LEFT ATRIUM             Index        RIGHT ATRIUM           Index LA diam:        3.60 cm 1.38 cm/m   RA Area:     22.10 cm LA Vol (A2C):   43.2 ml 16.53 ml/m  RA Volume:   64.00 ml  24.49 ml/m LA Vol (A4C):   29.4 ml 11.25 ml/m LA Biplane Vol: 35.4 ml 13.55 ml/m  AORTIC VALVE AV Area (Vmax): 1.31 cm AV Vmax:        362.00 cm/s AV Peak Grad:   52.4 mmHg LVOT Vmax:      105.00 cm/s LVOT Vmean:     69.700 cm/s LVOT VTI:       0.169 m  AORTA Ao Root diam: 3.60 cm Ao Asc diam:  3.60 cm MITRAL VALVE               TRICUSPID VALVE MV Area (PHT): 5.13 cm    TR Peak grad:    62.1 mmHg MV Decel Time: 148 msec    TR Vmax:        394.00 cm/s MV E velocity: 63.20 cm/s MV A velocity: 70.60 cm/s  SHUNTS MV E/A ratio:  0.90        Systemic VTI:  0.17 m                            Systemic Diam: 2.40 cm Buford Dresser MD Electronically signed by Buford Dresser MD Signature Date/Time: 04/05/2021/3:24:21 PM    Final    VAS [place] LOWER EXTREMITY VENOUS (DVT) (ONLY [facility] & [facility])  Result Date: 04/05/2021  Lower Venous DVT Study Patient Name:  JEZIAH KRETSCHMER  Date of Exam:   04/05/2021 Medical Rec #: [id number]         Accession #:    [id number] Date of Birth: [birth year]         Patient Gender: M Patient Age:   40 years Exam Location:  [facility] Procedure:      VAS [place] LOWER EXTREMITY VENOUS (DVT) Referring Phys: RILEY RANSOM --------------------------------------------------------------------------------  Indications: Swelling, and Edema.  Performing Technologist: Archie Patten RVS  Examination Guidelines: A complete evaluation includes B-mode imaging, spectral Doppler, color Doppler, and power Doppler as needed of all accessible portions of each vessel. Bilateral testing is considered an integral part of a complete examination. Limited examinations for reoccurring indications may be performed as noted. The reflux portion of the exam is performed with the patient in reverse Trendelenburg.  +-----+---------------+---------+-----------+----------+--------------+ RIGHTCompressibilityPhasicitySpontaneityPropertiesThrombus Aging +-----+---------------+---------+-----------+----------+--------------+ CFV  Full           Yes      Yes                                 +-----+---------------+---------+-----------+----------+--------------+   +---------+---------------+---------+-----------+----------+-----------------+ LEFT     CompressibilityPhasicitySpontaneityPropertiesThrombus Aging    +---------+---------------+---------+-----------+----------+-----------------+  CFV      Full           Yes      Yes                                    +---------+---------------+---------+-----------+----------+-----------------+  SFJ      Full                                                           +---------+---------------+---------+-----------+----------+-----------------+ FV Prox  Full                                                           +---------+---------------+---------+-----------+----------+-----------------+ FV Mid   Full                                                           +---------+---------------+---------+-----------+----------+-----------------+ FV DistalNone                                         Age Indeterminate +---------+---------------+---------+-----------+----------+-----------------+ PFV      Full                                                           +---------+---------------+---------+-----------+----------+-----------------+ POP      None           No       No                   Acute             +---------+---------------+---------+-----------+----------+-----------------+ PTV      None                                         Age Indeterminate +---------+---------------+---------+-----------+----------+-----------------+ PERO     Partial                                      Age Indeterminate +---------+---------------+---------+-----------+----------+-----------------+     Summary: RIGHT: - No evidence of common femoral vein obstruction.  LEFT: - Findings consistent with acute deep vein thrombosis involving the left popliteal vein. - Findings consistent with age indeterminate deep vein thrombosis involving the left femoral vein, left peroneal veins, and left posterior tibial veins.  *See table(s) above for measurements and observations. Electronically signed by Deitra Mayo MD on 04/05/2021 at 1:31:34 PM.    Final      TODAY-DAY OF DISCHARGE:  Subjective:   James Jordan  today has no headache,no chest abdominal pain,no new weakness tingling or numbness, feels much better wants to go home today.   Objective:   Blood pressure (!) 146/83, pulse (!) 58, temperature 97.7 F (36.5 C), temperature source Oral, resp. rate 20, height 6\' 4"  (1.93 m), weight 133.8 kg, SpO2 99 %.  Intake/Output Summary (Last 24 hours) at 04/08/2021 1513 Last data filed at 04/08/2021 1400 Gross per 24 hour  Intake 664 ml  Output --  Net 664 ml   Filed Weights   04/05/21 1142  Weight: 133.8 kg    Exam: Awake Alert, Oriented *3, No new F.N deficits, Normal affect [place].AT,PERRAL Supple Neck,No JVD, No cervical lymphadenopathy appriciated.  Symmetrical Chest wall movement, Good air movement bilaterally, CTAB RRR,No Gallops,Rubs or new Murmurs, No Parasternal Heave +ve B.Sounds, Abd Soft, Non tender, No organomegaly appriciated, No rebound -guarding or rigidity. No Cyanosis, Clubbing or edema, No new Rash or bruise   PERTINENT RADIOLOGIC STUDIES: No results found.   PERTINENT LAB RESULTS: CBC: Recent Labs    04/07/21 0148 04/08/21 0350  WBC 10.2 10.3  HGB 15.0 14.4  HCT 45.6 44.4  PLT 214 218   CMET CMP     Component Value Date/Time   NA 138 04/08/2021 0350   K 3.8 04/08/2021 0350   CL 105 04/08/2021 0350   CO2 27 04/08/2021 0350   GLUCOSE 111 (H) 04/08/2021 0350   BUN 12 04/08/2021 0350   CREATININE 1.25 (H) 04/08/2021 0350   CREATININE 1.08 04/01/2016 1246   CALCIUM 8.8 (L) 04/08/2021 0350   PROT 6.8 04/05/2021 0829   ALBUMIN 3.9 04/05/2021 0829   AST 18 04/05/2021 0829   ALT 22 04/05/2021 0829   ALKPHOS 59 04/05/2021 0829   BILITOT 1.1 04/05/2021 0829   GFRNONAA >60 04/08/2021 0350   GFRAA >60 10/19/2018 2128    GFR Estimated Creatinine Clearance: 107.9 mL/min (A) (by C-G formula based on SCr of 1.25 mg/dL (H)). No results for input(s): LIPASE, AMYLASE in the last 72 hours. No results for input(s): CKTOTAL, CKMB, CKMBINDEX, TROPONINI in the last  72 hours. Invalid input(s): POCBNP No results for input(s): DDIMER in the last 72 hours. No results for input(s): HGBA1C in the last 72 hours. No results for input(s): CHOL, HDL, LDLCALC, TRIG, CHOLHDL, LDLDIRECT in the last 72 hours. No results for input(s): TSH, T4TOTAL, T3FREE, THYROIDAB in the last 72 hours.  Invalid input(s): FREET3 No results for input(s): VITAMINB12, FOLATE, FERRITIN, TIBC, IRON, RETICCTPCT in the last 72 hours. Coags: No results for input(s): INR in the last 72 hours.  Invalid input(s): PT Microbiology: Recent Results (from the past 240 hour(s))  Resp Panel by RT-PCR (Flu A&B, Covid) Nasopharyngeal Swab     Status: None   Collection Time: 04/05/21 12:18 PM   Specimen: Nasopharyngeal Swab; Nasopharyngeal(NP) swabs in vial transport medium  Result Value Ref Range Status   SARS Coronavirus 2 by RT PCR NEGATIVE NEGATIVE Final    Comment: (NOTE) SARS-CoV-2 target nucleic acids are NOT DETECTED.  The SARS-CoV-2 RNA is generally detectable in upper respiratory specimens during the acute phase of infection. The lowest concentration of SARS-CoV-2 viral copies this assay can detect is 138 copies/mL. A negative result does not preclude SARS-Cov-2 infection and should not be used as the sole basis for treatment or other patient management decisions. A negative result may occur with  improper specimen collection/handling, submission of specimen other than nasopharyngeal swab, presence of viral mutation(s) within the areas targeted by this assay, and inadequate number of viral copies(<138 copies/mL). A negative result must be combined with clinical observations, patient history, and epidemiological information. The expected result is Negative.  Fact Sheet for Patients:  EntrepreneurPulse.com.au  Fact Sheet for Healthcare Providers:  IncredibleEmployment.be  This test is no t yet approved or cleared by the [place] FDA  and   has been authorized for detection and/or diagnosis of SARS-CoV-2 by FDA under an Emergency Use Authorization (EUA). This EUA will remain  in effect (meaning this test can be used) for the duration of the COVID-19 declaration under Section 564(b)(1) of the Act, 21 U.S.C.section 360bbb-3(b)(1), unless the authorization is terminated  or revoked sooner.       Influenza A by PCR NEGATIVE NEGATIVE Final   Influenza B by PCR NEGATIVE NEGATIVE Final    Comment: (NOTE) The Xpert Xpress SARS-CoV-2/FLU/RSV plus assay is intended as an aid in the diagnosis of influenza from Nasopharyngeal swab specimens and should not be used as a sole basis for treatment. Nasal washings and aspirates are unacceptable for Xpert Xpress SARS-CoV-2/FLU/RSV testing.  Fact Sheet for Patients: EntrepreneurPulse.com.au  Fact Sheet for Healthcare Providers: IncredibleEmployment.be  This test is not yet approved or cleared by the [place] FDA and has been authorized for detection and/or diagnosis of SARS-CoV-2 by FDA under an Emergency Use Authorization (EUA). This EUA will remain in effect (meaning this test can be used) for the duration of the COVID-19 declaration under Section 564(b)(1) of the Act, 21 U.S.C. section 360bbb-3(b)(1), unless the authorization is terminated or revoked.  Performed at [facility], [place] [address], [place], [place] [address]     FURTHER DISCHARGE INSTRUCTIONS:  Get Medicines reviewed and adjusted: Please take all your medications with you for your next visit with your Primary MD  Laboratory/radiological data: Please request your Primary MD to go over all hospital tests and procedure/radiological results at the follow up, please ask your Primary MD to get all Hospital records sent to his/her office.  In some cases, they will be blood work, cultures and biopsy results pending at the time of your discharge. Please request that your  primary care M.D. goes through all the records of your hospital data and follows up on these results.  Also Note the following: If you experience worsening of your admission symptoms, develop shortness of breath, life threatening emergency, suicidal or homicidal thoughts you must seek medical attention immediately by calling 911 or calling your MD immediately  if symptoms less severe.  You must read complete instructions/literature along with all the possible adverse reactions/side effects for all the Medicines you take and that have been prescribed to you. Take any new Medicines after you have completely understood and accpet all the possible adverse reactions/side effects.   Do not drive when taking Pain medications or sleeping medications (Benzodaizepines)  Do not take more than prescribed Pain, Sleep and Anxiety Medications. It is not advisable to combine anxiety,sleep and pain medications without talking with your primary care practitioner  Special Instructions: If you have smoked or chewed Tobacco  in the last 2 yrs please stop smoking, stop any regular Alcohol  and or any Recreational drug use.  Wear Seat belts while driving.  Please note: You were cared for by a hospitalist during your hospital stay. Once you are discharged, your primary care physician will handle any further medical issues. Please note that NO REFILLS for any discharge medications will be authorized once you are discharged, as it is imperative that you return to your primary care physician (or establish a relationship with a primary care physician if you do not have one) for your post hospital discharge needs so that they can reassess your need for medications and monitor your lab values.  Total Time spent coordinating discharge including counseling, education and face to face time equals 35  minutes.  SignedOren Binet 04/08/2021 3:13 PM

## 2021-04-09 LAB — FACTOR 5 LEIDEN

## 2021-04-13 LAB — PROTHROMBIN GENE MUTATION

## 2021-04-17 ENCOUNTER — Telehealth: Payer: Self-pay | Admitting: Pharmacist

## 2021-04-17 NOTE — Telephone Encounter (Signed)
Transitions of Care Pharmacy  ° °Call attempted for a pharmacy transitions of care follow-up. HIPAA appropriate voicemail was left with call back information provided.  ° °Call attempt #1. Will follow-up in 2-3 days.  °  °

## 2021-04-20 ENCOUNTER — Ambulatory Visit: Payer: Managed Care, Other (non HMO) | Admitting: Family Medicine

## 2021-04-20 ENCOUNTER — Encounter: Payer: Self-pay | Admitting: Family Medicine

## 2021-04-20 VITALS — BP 130/76 | HR 83 | Temp 98.1°F | Resp 16 | Ht 76.0 in | Wt 297.2 lb

## 2021-04-20 DIAGNOSIS — K429 Umbilical hernia without obstruction or gangrene: Secondary | ICD-10-CM

## 2021-04-20 DIAGNOSIS — C884 Extranodal marginal zone b-cell lymphoma of mucosa-associated lymphoid tissue (malt-lymphoma) not having achieved remission: Secondary | ICD-10-CM

## 2021-04-20 DIAGNOSIS — R739 Hyperglycemia, unspecified: Secondary | ICD-10-CM

## 2021-04-20 DIAGNOSIS — I2699 Other pulmonary embolism without acute cor pulmonale: Secondary | ICD-10-CM

## 2021-04-20 DIAGNOSIS — Z7901 Long term (current) use of anticoagulants: Secondary | ICD-10-CM | POA: Diagnosis not present

## 2021-04-20 DIAGNOSIS — R7989 Other specified abnormal findings of blood chemistry: Secondary | ICD-10-CM | POA: Diagnosis not present

## 2021-04-20 DIAGNOSIS — I272 Pulmonary hypertension, unspecified: Secondary | ICD-10-CM

## 2021-04-20 LAB — CBC
HCT: 49.8 % (ref 39.0–52.0)
Hemoglobin: 16.3 g/dL (ref 13.0–17.0)
MCHC: 32.8 g/dL (ref 30.0–36.0)
MCV: 91.7 fl (ref 78.0–100.0)
Platelets: 330 10*3/uL (ref 150.0–400.0)
RBC: 5.43 Mil/uL (ref 4.22–5.81)
RDW: 13.3 % (ref 11.5–15.5)
WBC: 8.5 10*3/uL (ref 4.0–10.5)

## 2021-04-20 LAB — BASIC METABOLIC PANEL
BUN: 19 mg/dL (ref 6–23)
CO2: 31 mEq/L (ref 19–32)
Calcium: 9.7 mg/dL (ref 8.4–10.5)
Chloride: 101 mEq/L (ref 96–112)
Creatinine, Ser: 1.1 mg/dL (ref 0.40–1.50)
GFR: 79.39 mL/min (ref >60.00)
Glucose, Bld: 87 mg/dL (ref 70–99)
Potassium: 4.7 mEq/L (ref 3.5–5.1)
Sodium: 139 mEq/L (ref 135–145)

## 2021-04-20 LAB — HEMOGLOBIN A1C: Hgb A1c MFr Bld: 6.1 % (ref 4.6–6.5)

## 2021-04-20 NOTE — Progress Notes (Signed)
Subjective:  Patient ID: James Jordan, male    DOB: [birth year]  Age: 50 y.o. MRN: [id number]  CC:  Chief Complaint  Patient presents with   Hospitalization Follow-up    Pt here for fu from ED visit went for Lt calf pain with blood clots, pt would like to know what steps are next, notes the pain is not currently present in his leg     HPI James Jordan presents for  Hospital follow-up. new patient to me.  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  No evidence of disease recurrence on May 9 visit.  [facility].  Local ophthalmologist Dr. Katy Fitch.  Oncologist Dr. Lanier Ensign at [facility], hematologic malignancies.  Bilateral pulmonary embolism with left leg DVT Admitted October 23 through October 26.  Presented to ER with left calf pain/swelling and shortness of breath.  Started on IV heparin, not hypoxic.  Transition to Eliquis.  Thought to be possible unprovoked DVT, so plan for hypercoagulable state work-up and follow-up with oncologist at [facility] to ensure his lymphoma is in remission. Office visit scheduled with oncologist November 30, PET scan ordered.  Dyspnea resolved in hospital. Feeling better. No leg pains, tolerating Eliquis, was able to afford. Has continuing pack Rx with 3 RF's.  No preceeding surgery, immobility or long air/car travel. No prior DVT/PE. Few pains in legs in past, no known swelling. Uses cough bed - legs hang off.   Pulmonary hypertension Noted on echo, likely due to thromboembolism.  Was continued on Eliquis for anticoagulation with plan for repeat echo in the next few months.  Of note he did have a slightly elevated troponin likely due to PE/RV strain.  EF was preserved on echo. No dypsnea. No chest pain.   No other Rx meds.  No chronic med problems other than above.  Hernia surgery for umbilical hernia in 2426, recurrence past 4 years.  No pains. No repeat eval recently.    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 Plan to RTW this Thursday, but would like to wait on results  and return to work in 1 week.  54yo son, 88yo dtr.   No alcohol, no tobacco.   Hyperglycemia Glucose 104-120 over past few years.  Lab Results  Component Value Date   HGBA1C 5.5 04/01/2016   Elevated creatinine: 1.25 recently. Prior range 1.11-1.21.  Lab Results  Component Value Date   CREATININE 1.25 (H) 04/08/2021     History Patient Active Problem List   Diagnosis Date Noted   Bilateral pulmonary embolism ([place]) 04/05/2021   Acute deep vein thrombosis (DVT) of popliteal vein of left lower extremity (HCC)    PAC (premature atrial contraction) 06/01/2016   BMI 35.0-35.9,adult 04/01/2016   Past Medical History:  Diagnosis Date   ACL tear - left knee 2002   Per patient, not repaired    Umbilical hernia 8341   Past Surgical History:  Procedure Laterality Date   HERNIA REPAIR  9622   Umbilical   No Known Allergies Prior to Admission medications   Medication Sig Start Date End Date Taking? Authorizing Provider  apixaban (ELIQUIS) 5 MG TABS tablet Take 1 tablet (5 mg total) by mouth 2 (two) times daily. To begin after Eliquis starter pack complete 04/08/21   Ghimire, Henreitta Leber, MD  APIXABAN James Jordan) VTE STARTER PACK (10MG  AND 5MG ) Take as directed on package: start with two-5mg  tablets twice daily for 7 days. On day  8, switch to one-5mg  tablet twice daily. 04/08/21   Ghimire, Henreitta Leber, MD   Social History   Socioeconomic History   Marital status: Married    Spouse name: Not on file   Number of children: Not on file   Years of education: Not on file   Highest education level: Not on file  Occupational History   Occupation: warehouse  Tobacco Use   Smoking status: Never   Smokeless tobacco: Current    Types: Snuff  Substance and Sexual Activity   Alcohol use: No   Drug use: No   Sexual activity: Yes  Other Topics Concern   Not on file  Social History  Narrative   Lives with his wife.   Social Determinants of Health   Financial Resource Strain: Not on file  Food Insecurity: Not on file  Transportation Needs: Not on file  Physical Activity: Not on file  Stress: Not on file  Social Connections: Not on file  Intimate Partner Violence: Not on file    Review of Systems   Objective:   Vitals:   04/20/21 1036  BP: 130/76  Pulse: 83  Resp: 16  Temp: 98.1 F (36.7 C)  TempSrc: Temporal  SpO2: 98%  Weight: 297 lb 3.2 oz (134.8 kg)  Height: 6\' 4"  (1.93 m)     Physical Exam Vitals reviewed.  Constitutional:      Appearance: He is well-developed.  HENT:     Head: Normocephalic and atraumatic.  Neck:     Vascular: No carotid bruit or JVD.  Cardiovascular:     Rate and Rhythm: Normal rate and regular rhythm.     Heart sounds: Normal heart sounds. No murmur heard. Pulmonary:     Effort: Pulmonary effort is normal.     Breath sounds: Normal breath sounds. No rales.  Abdominal:     Hernia: A hernia (reducble umbilical, no skin changes, no pain.) is present.  Musculoskeletal:     Right lower leg: No edema.     Left lower leg: No edema.  Skin:    General: Skin is warm and dry.  Neurological:     Mental Status: He is alert and oriented to person, place, and time.  Psychiatric:        Mood and Affect: Mood normal.    54 minutes spent during visit, including chart review, counseling and assimilation of information, exam, discussion of plan, and chart completion.    Assessment & Plan:  James Jordan is a 50 y.o. male . Bilateral pulmonary embolism (HCC) MALT lymphoma ([place]) Pulmonary hypertension ([place])  -Denies chest pain/dyspnea, tolerating anticoagulation and has refills for continuing month packs.  Check CBC for monitoring.  Follow-up with oncology as planned to evaluate for recurrence of MALT lymphoma as well as to discuss anticoagulation timing, as well as potential hypercoagulable work-up.  RTC/ER  precautions  Umbilical hernia without obstruction and without gangrene  -Easily reducible, hernia precautions given, handout given, but with recent start of anticoagulation may need to delay surgery unless symptomatic.  Hyperglycemia - Plan: Hemoglobin A1c  Elevated serum creatinine - Plan: Basic metabolic panel Borderline, repeat labs  Current use of long term anticoagulation - Plan: CBC   No orders of the defined types were placed in this encounter.  Patient Instructions  Continue anticoagulation with same medication.  If any chest pains, shortness of breath or new symptoms return for recheck here or urgent care/ER as we discussed.  Keep follow-up with your hematologist and can discuss  other testing for possible risks for blood clots at that visit.  Would recommend staying on anticoagulation/blood thinner for 6 months at this point.  Keep follow-up with me in 3 months as planned.  See information below on hernias, if any persistent pain, or inability to reduce that hernia be seen right away.  I will check some blood work based on a few borderline levels in the hospital and to monitor your blood counts today.  I will let you know if there were any concerns.  Thanks for coming in today.   Umbilical Hernia, Adult A hernia is a bulge of tissue that pushes through an opening between muscles. An umbilical hernia happens in the abdomen, near the belly button (umbilicus). The hernia may contain tissues from the small intestine, large intestine, or fatty tissue covering the intestines. Umbilical hernias in adults tend to get worse over time, and they require surgical treatment. There are different types of umbilical hernias, including: Indirect hernia. This type is located just above or below the umbilicus. It is the most common type of umbilical hernia in adults. Direct hernia. This type forms through an opening formed by the umbilicus. Reducible hernia. This type of hernia comes and goes. It  may be visible only when you strain, lift something heavy, or cough. This type of hernia can be pushed back into the abdomen (reduced). Incarcerated hernia. This type traps abdominal tissue inside the hernia. This type of hernia cannot be reduced. Strangulated hernia. This type of hernia cuts off blood flow to the tissues inside the hernia. The tissues can start to die if this happens. This type of hernia requires emergency treatment. What are the causes? An umbilical hernia happens when tissue inside the abdomen presses on a weak area of the abdominal muscles. What increases the risk? You may have a greater risk of this condition if you: Are obese. Have had several pregnancies. Have a buildup of fluid inside your abdomen. Have had surgery that weakens the abdominal muscles. What are the signs or symptoms? The main symptom of this condition is a painless bulge at or near the belly button. A reducible hernia may be visible only when you strain, lift something heavy, or cough. Other symptoms may include: Dull pain. A feeling of pressure. Symptoms of a strangulated hernia may include: Pain that gets increasingly worse. Nausea and vomiting. Pain when pressing on the hernia. Skin over the hernia becoming red or purple. Constipation. Blood in the stool. How is this diagnosed? This condition may be diagnosed based on: A physical exam. You may be asked to cough or strain while standing. These actions increase the pressure inside your abdomen and can force the hernia through the opening in your muscles. Your health care provider may try to reduce the hernia by pressing on it. Your symptoms and medical history. How is this treated? Surgery is the only treatment for an umbilical hernia. Surgery for a strangulated hernia is done as soon as possible. If you have a small hernia that is not incarcerated, you may need to lose weight before having surgery. Follow these instructions at home: Lose weight,  if told by your health care provider. Do not try to push the hernia back in. Watch your hernia for any changes in color or size. Tell your health care provider if any changes occur. You may need to avoid activities that increase pressure on your hernia. Do not lift anything that is heavier than 10 lb (4.5 kg), or the limit that  you are told, until your health care provider says that it is safe. Take over-the-counter and prescription medicines only as told by your health care provider. Keep all follow-up visits. This is important. Contact a health care provider if: Your hernia gets larger. Your hernia becomes painful. Get help right away if: You develop sudden, severe pain near the area of your hernia. You have pain as well as nausea or vomiting. You have pain and the skin over your hernia changes color. You develop a fever or chills. Summary A hernia is a bulge of tissue that pushes through an opening between muscles. An umbilical hernia happens near the belly button. Surgery is the only treatment for an umbilical hernia. Do not try to push your hernia back in. Keep all follow-up visits. This is important. This information is not intended to replace advice given to you by your health care provider. Make sure you discuss any questions you have with your health care provider. Document Revised: 01/07/2020 Document Reviewed: 01/07/2020 Elsevier Patient Education  [place].    Pulmonary Embolism A pulmonary embolism (PE) is a sudden blockage or decrease of blood flow in one or both lungs that happens when a clot travels into the arteries of the lung (pulmonary arteries). Most blockages come from a blood clot that forms in the vein of a leg or arm (deep vein thrombosis, DVT) and travels to the lungs. A clot is blood that has thickened into a gel or solid. PE is a dangerous and life-threatening condition that needs to be treated right away. What are the causes? This condition is  usually caused by a blood clot that forms in a vein and moves to the lungs. In rare cases, it may be caused by air, fat, part of a tumor, or other tissue that moves through the veins and into the lungs. What increases the risk? The following factors may make you more likely to develop this condition: Experiencing a traumatic injury, such as breaking a hip or leg. Having: A spinal cord injury. Major surgery, especially hip or knee replacement, or surgery on parts of the nervous system or on the abdomen. A stroke. A blood-clotting disease. Long-term (chronic) lung or heart disease. Cancer, especially if you are being treated with chemotherapy. A central venous catheter. Taking medicines that contain estrogen. These include birth control pills and hormone replacement therapy. Being: Pregnant. In the period of time after your baby is delivered (postpartum). Older than age 9. Overweight. A smoker, especially if you have other risks. Not very active (sedentary), not being able to move at all, or spending long periods sitting, such as travel over 6 hours. You are also at a greater risk if you have a leg in a cast or splint. What are the signs or symptoms? Symptoms of this condition usually start suddenly and include: Shortness of breath during activity or at rest. Coughing, coughing up blood, or coughing up bloody mucus. Chest pain, back pain, or shoulder blade pain that gets worse with deep breaths. Rapid or irregular heartbeat. Feeling light-headed or dizzy, or fainting. Feeling anxious. Pain and swelling in a leg. This is a symptom of DVT, which can lead to PE. How is this diagnosed? This condition may be diagnosed based on your medical history, a physical exam, and tests. Tests may include: Blood tests. An ECG (electrocardiogram) of the heart. A CT pulmonary angiogram. This test checks blood flow in and around your lungs. A ventilation-perfusion scan, also called a lung VQ scan.  This  test measures air flow and blood flow to the lungs. An ultrasound to check for a DVT. How is this treated? Treatment for this condition depends on many factors, such as the cause of your PE, your risk for bleeding or developing more clots, and other medical conditions you may have. Treatment aims to stop blood clots from forming or growing larger. In some cases, treatment may be aimed at breaking apart or removing the blood clot. Treatment may include: Medicines, such as: Blood thinning medicines, also called anticoagulants, to stop clots from forming and growing. Medicines that break apart clots (fibrinolytics). Procedures, such as: Using a flexible tube to remove a blood clot (embolectomy) or to deliver medicine to destroy it (catheter-directed thrombolysis). Surgery to remove the clot (surgical embolectomy). This is rare. You may need a combination of immediate, long-term, and extended treatments. Your treatment may continue for several months (maintenance therapy) or longer depending on your medical conditions. You and your health care provider will work together to choose the treatment program that is best for you. Follow these instructions at home: Medicines Take over-the-counter and prescription medicines only as told by your health care provider. If you are taking blood thinners: Talk with your health care provider before you take any medicines that contain aspirin or NSAIDs, such as ibuprofen. These medicines increase your risk for dangerous bleeding. Take your medicine exactly as told, at the same time every day. Avoid activities that could cause injury or bruising, and follow instructions about how to prevent falls. Wear a medical alert bracelet or carry a card that lists what medicines you take. Understand what foods and drugs interact with any medicines that you are taking. General instructions Ask your health care provider when you may return to your normal activities. Avoid  sitting or lying for a long time without moving. Maintain a healthy weight. Ask your health care provider what weight is healthy for you. Do not use any products that contain nicotine or tobacco. These products include cigarettes, chewing tobacco, and vaping devices, such as e-cigarettes. If you need help quitting, ask your health care provider. Talk with your health care provider about any travel plans. It is important to make sure that you are still able to take your medicine while traveling. Keep all follow-up visits. This is important. Where to find more information American Lung Association: www.lung.org Centers for Disease Control and Prevention: http://www.wolf.info/ Contact a health care provider if: You missed a dose of your blood thinner medicine. You have a fever. Get help right away if: You have: New or increased pain, swelling, warmth, or redness in an arm or leg. Shortness of breath that gets worse during activity or at rest. Worsening chest pain. A rapid or irregular heartbeat. A severe headache. Vision changes. A serious fall or accident, or you hit your head. Blood in your vomit, stool, or urine. A cut that will not stop bleeding. You cough up blood. You feel light-headed or dizzy, and that feeling does not go away. You cannot move your arms or legs. You are confused or have memory loss. These symptoms may represent a serious problem that is an emergency. Do not wait to see if the symptoms will go away. Get medical help right away. Call your local emergency services (911 in the [place] Do not drive yourself to the hospital. Summary A pulmonary embolism (PE) is a serious and potentially life-threatening condition. It happens when a blood clot from one part of the body travels to the  arteries of the lung, causing a sudden blockage or decrease of blood flow to the lungs. This may result in shortness of breath, chest pain, dizziness, and fainting. Treatments for this condition usually  include medicines to thin your blood (anticoagulants) or medicines to break apart blood clots. If you are given blood thinners, take your medicine exactly as told by your health care provider, at the same time every day. This is important. Understand what foods and drugs interact with any medicines that you are taking. If you have signs of PE or DVT, call your local emergency services (911 in the [place] This information is not intended to replace advice given to you by your health care provider. Make sure you discuss any questions you have with your health care provider. Document Revised: 05/02/2020 Document Reviewed: 05/02/2020 Elsevier Patient Education  2022 [place],   Merri Ray, MD [place] 04/20/21 12:10 PM

## 2021-04-20 NOTE — Patient Instructions (Signed)
Continue anticoagulation with same medication.  If any chest pains, shortness of breath or new symptoms return for recheck here or urgent care/ER as we discussed.  Keep follow-up with your hematologist and can discuss other testing for possible risks for blood clots at that visit.  Would recommend staying on anticoagulation/blood thinner for 6 months at this point.  Keep follow-up with me in 3 months as planned.  See information below on hernias, if any persistent pain, or inability to reduce that hernia be seen right away.  I will check some blood work based on a few borderline levels in the hospital and to monitor your blood counts today.  I will let you know if there were any concerns.  Thanks for coming in today.   Umbilical Hernia, Adult A hernia is a bulge of tissue that pushes through an opening between muscles. An umbilical hernia happens in the abdomen, near the belly button (umbilicus). The hernia may contain tissues from the small intestine, large intestine, or fatty tissue covering the intestines. Umbilical hernias in adults tend to get worse over time, and they require surgical treatment. There are different types of umbilical hernias, including: Indirect hernia. This type is located just above or below the umbilicus. It is the most common type of umbilical hernia in adults. Direct hernia. This type forms through an opening formed by the umbilicus. Reducible hernia. This type of hernia comes and goes. It may be visible only when you strain, lift something heavy, or cough. This type of hernia can be pushed back into the abdomen (reduced). Incarcerated hernia. This type traps abdominal tissue inside the hernia. This type of hernia cannot be reduced. Strangulated hernia. This type of hernia cuts off blood flow to the tissues inside the hernia. The tissues can start to die if this happens. This type of hernia requires emergency treatment. What are the causes? An umbilical hernia happens when  tissue inside the abdomen presses on a weak area of the abdominal muscles. What increases the risk? You may have a greater risk of this condition if you: Are obese. Have had several pregnancies. Have a buildup of fluid inside your abdomen. Have had surgery that weakens the abdominal muscles. What are the signs or symptoms? The main symptom of this condition is a painless bulge at or near the belly button. A reducible hernia may be visible only when you strain, lift something heavy, or cough. Other symptoms may include: Dull pain. A feeling of pressure. Symptoms of a strangulated hernia may include: Pain that gets increasingly worse. Nausea and vomiting. Pain when pressing on the hernia. Skin over the hernia becoming red or purple. Constipation. Blood in the stool. How is this diagnosed? This condition may be diagnosed based on: A physical exam. You may be asked to cough or strain while standing. These actions increase the pressure inside your abdomen and can force the hernia through the opening in your muscles. Your health care provider may try to reduce the hernia by pressing on it. Your symptoms and medical history. How is this treated? Surgery is the only treatment for an umbilical hernia. Surgery for a strangulated hernia is done as soon as possible. If you have a small hernia that is not incarcerated, you may need to lose weight before having surgery. Follow these instructions at home: Lose weight, if told by your health care provider. Do not try to push the hernia back in. Watch your hernia for any changes in color or size. Tell your health  care provider if any changes occur. You may need to avoid activities that increase pressure on your hernia. Do not lift anything that is heavier than 10 lb (4.5 kg), or the limit that you are told, until your health care provider says that it is safe. Take over-the-counter and prescription medicines only as told by your health care  provider. Keep all follow-up visits. This is important. Contact a health care provider if: Your hernia gets larger. Your hernia becomes painful. Get help right away if: You develop sudden, severe pain near the area of your hernia. You have pain as well as nausea or vomiting. You have pain and the skin over your hernia changes color. You develop a fever or chills. Summary A hernia is a bulge of tissue that pushes through an opening between muscles. An umbilical hernia happens near the belly button. Surgery is the only treatment for an umbilical hernia. Do not try to push your hernia back in. Keep all follow-up visits. This is important. This information is not intended to replace advice given to you by your health care provider. Make sure you discuss any questions you have with your health care provider. Document Revised: 01/07/2020 Document Reviewed: 01/07/2020 Elsevier Patient Education  [place].    Pulmonary Embolism A pulmonary embolism (PE) is a sudden blockage or decrease of blood flow in one or both lungs that happens when a clot travels into the arteries of the lung (pulmonary arteries). Most blockages come from a blood clot that forms in the vein of a leg or arm (deep vein thrombosis, DVT) and travels to the lungs. A clot is blood that has thickened into a gel or solid. PE is a dangerous and life-threatening condition that needs to be treated right away. What are the causes? This condition is usually caused by a blood clot that forms in a vein and moves to the lungs. In rare cases, it may be caused by air, fat, part of a tumor, or other tissue that moves through the veins and into the lungs. What increases the risk? The following factors may make you more likely to develop this condition: Experiencing a traumatic injury, such as breaking a hip or leg. Having: A spinal cord injury. Major surgery, especially hip or knee replacement, or surgery on parts of the nervous  system or on the abdomen. A stroke. A blood-clotting disease. Long-term (chronic) lung or heart disease. Cancer, especially if you are being treated with chemotherapy. A central venous catheter. Taking medicines that contain estrogen. These include birth control pills and hormone replacement therapy. Being: Pregnant. In the period of time after your baby is delivered (postpartum). Older than age 67. Overweight. A smoker, especially if you have other risks. Not very active (sedentary), not being able to move at all, or spending long periods sitting, such as travel over 6 hours. You are also at a greater risk if you have a leg in a cast or splint. What are the signs or symptoms? Symptoms of this condition usually start suddenly and include: Shortness of breath during activity or at rest. Coughing, coughing up blood, or coughing up bloody mucus. Chest pain, back pain, or shoulder blade pain that gets worse with deep breaths. Rapid or irregular heartbeat. Feeling light-headed or dizzy, or fainting. Feeling anxious. Pain and swelling in a leg. This is a symptom of DVT, which can lead to PE. How is this diagnosed? This condition may be diagnosed based on your medical history, a physical exam, and  tests. Tests may include: Blood tests. An ECG (electrocardiogram) of the heart. A CT pulmonary angiogram. This test checks blood flow in and around your lungs. A ventilation-perfusion scan, also called a lung VQ scan. This test measures air flow and blood flow to the lungs. An ultrasound to check for a DVT. How is this treated? Treatment for this condition depends on many factors, such as the cause of your PE, your risk for bleeding or developing more clots, and other medical conditions you may have. Treatment aims to stop blood clots from forming or growing larger. In some cases, treatment may be aimed at breaking apart or removing the blood clot. Treatment may include: Medicines, such as: Blood  thinning medicines, also called anticoagulants, to stop clots from forming and growing. Medicines that break apart clots (fibrinolytics). Procedures, such as: Using a flexible tube to remove a blood clot (embolectomy) or to deliver medicine to destroy it (catheter-directed thrombolysis). Surgery to remove the clot (surgical embolectomy). This is rare. You may need a combination of immediate, long-term, and extended treatments. Your treatment may continue for several months (maintenance therapy) or longer depending on your medical conditions. You and your health care provider will work together to choose the treatment program that is best for you. Follow these instructions at home: Medicines Take over-the-counter and prescription medicines only as told by your health care provider. If you are taking blood thinners: Talk with your health care provider before you take any medicines that contain aspirin or NSAIDs, such as ibuprofen. These medicines increase your risk for dangerous bleeding. Take your medicine exactly as told, at the same time every day. Avoid activities that could cause injury or bruising, and follow instructions about how to prevent falls. Wear a medical alert bracelet or carry a card that lists what medicines you take. Understand what foods and drugs interact with any medicines that you are taking. General instructions Ask your health care provider when you may return to your normal activities. Avoid sitting or lying for a long time without moving. Maintain a healthy weight. Ask your health care provider what weight is healthy for you. Do not use any products that contain nicotine or tobacco. These products include cigarettes, chewing tobacco, and vaping devices, such as e-cigarettes. If you need help quitting, ask your health care provider. Talk with your health care provider about any travel plans. It is important to make sure that you are still able to take your medicine while  traveling. Keep all follow-up visits. This is important. Where to find more information American Lung Association: www.lung.org Centers for Disease Control and Prevention: http://www.wolf.info/ Contact a health care provider if: You missed a dose of your blood thinner medicine. You have a fever. Get help right away if: You have: New or increased pain, swelling, warmth, or redness in an arm or leg. Shortness of breath that gets worse during activity or at rest. Worsening chest pain. A rapid or irregular heartbeat. A severe headache. Vision changes. A serious fall or accident, or you hit your head. Blood in your vomit, stool, or urine. A cut that will not stop bleeding. You cough up blood. You feel light-headed or dizzy, and that feeling does not go away. You cannot move your arms or legs. You are confused or have memory loss. These symptoms may represent a serious problem that is an emergency. Do not wait to see if the symptoms will go away. Get medical help right away. Call your local emergency services (911 in the  [place] Do not drive yourself to the hospital. Summary A pulmonary embolism (PE) is a serious and potentially life-threatening condition. It happens when a blood clot from one part of the body travels to the arteries of the lung, causing a sudden blockage or decrease of blood flow to the lungs. This may result in shortness of breath, chest pain, dizziness, and fainting. Treatments for this condition usually include medicines to thin your blood (anticoagulants) or medicines to break apart blood clots. If you are given blood thinners, take your medicine exactly as told by your health care provider, at the same time every day. This is important. Understand what foods and drugs interact with any medicines that you are taking. If you have signs of PE or DVT, call your local emergency services (911 in the [place] This information is not intended to replace advice given to you by your health  care provider. Make sure you discuss any questions you have with your health care provider. Document Revised: 05/02/2020 Document Reviewed: 05/02/2020 Elsevier Patient Education  2022 [employer].

## 2021-04-30 ENCOUNTER — Telehealth: Payer: Self-pay | Admitting: Family Medicine

## 2021-04-30 NOTE — Telephone Encounter (Signed)
Pt called in this morning stating that was was seen last week for a blood clot. He states that he has returned to work and is now having swelling in his foot and ankle. He states no shortness of breath or anything else. Please advise.  Pt declined triage.   No open appts today at our office or the surrounding offices.   Pt states that he takes lunch at 11am and that would be the best time to call him back.

## 2021-04-30 NOTE — Telephone Encounter (Signed)
He is on anticoagulation so unlikely worsening clot.  If he had not been walking or standing much, then just returned to work with increasing walking, that may explain some of the swelling, especially if it improves with foot elevation or at night.  If new pain or acute worsening symptoms (especially if any chest pain or shortness of breath)I do agree with evaluation in person at urgent care or ER today if needed.  If not, okay for in person visit tomorrow if possible by any provider.

## 2021-04-30 NOTE — Telephone Encounter (Signed)
I called patient and was unable to contact him. I left a voicemail letting him know that per Dr. Vonna Kotyk last recommendations he should be seen if symptoms returned. I advised that our schedules are full for today but I would recommend he head to urgent care or ER to get evaluated due to history. I advised that I would let Dr. Carlota Raspberry know of this and would call him back later if there were further recommendations.

## 2021-05-01 ENCOUNTER — Encounter: Payer: Self-pay | Admitting: Registered Nurse

## 2021-05-01 ENCOUNTER — Ambulatory Visit: Payer: Managed Care, Other (non HMO) | Admitting: Registered Nurse

## 2021-05-01 VITALS — BP 128/76 | HR 82 | Temp 98.3°F | Ht 76.0 in | Wt 298.4 lb

## 2021-05-01 DIAGNOSIS — I82432 Acute embolism and thrombosis of left popliteal vein: Secondary | ICD-10-CM | POA: Diagnosis not present

## 2021-05-01 DIAGNOSIS — I2699 Other pulmonary embolism without acute cor pulmonale: Secondary | ICD-10-CM | POA: Diagnosis not present

## 2021-05-01 NOTE — Telephone Encounter (Signed)
Called pt to discuss recommendation to be seen today by any provider, again no answer.

## 2021-05-01 NOTE — Telephone Encounter (Signed)
Pt seen in office 05/01/21

## 2021-05-01 NOTE — Progress Notes (Signed)
Established Patient Office Visit  Subjective:  Patient ID: James Jordan, male    DOB: 25-Sep-1970  Age: 50 y.o. MRN: [id number]  CC:  Chief Complaint  Patient presents with   Foot Swelling    Follow up on blood clots, still has swelling in left foot after starting work.     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 NED as of visit on May 9. Tx with Dr. Katy Fitch locally, Dr. Lanier Ensign at [facility]  Admitted to hosp Oct 23-28 with bilateral PE and DVT after presenting with L calf swelling and pain, new shob.  Iv heparin with transition to eliquis in hospital. Thought to be unprovoked. Noted to still be in disease remission by [facility]. Will follow up with Randon Goldsmith on Nov 30.   After dc, shob resolved. No leg pains. Tolerating eliquis well, plans to continue x 3 mo  Today Notes that he was having more swelling and some pain in L foot. He recently returned to work. Works in [employer].   Swelling reduces with elevation of legs No new calf pain or claudication No new shob, headaches, chest pain, palpitations, doe  Past Medical History:  Diagnosis Date   ACL tear - left knee 2002   Per patient, not repaired    Umbilical hernia 3557    Past Surgical History:  Procedure Laterality Date   HERNIA REPAIR  3220   Umbilical    Family History  Problem Relation Age of Onset   Diabetes Mother    Diabetes Maternal Grandfather    Lung cancer Paternal Grandmother    Lung cancer Paternal Grandfather    Heart disease Maternal Aunt    Diabetes Maternal Uncle     Social History   Socioeconomic History   Marital status: Married    Spouse name: Not on file   Number of children: Not on file   Years of education: Not on file   Highest education level: Not on file  Occupational History   Occupation: warehouse  Tobacco Use   Smoking status: Never   Smokeless tobacco: Current     Types: Snuff  Substance and Sexual Activity   Alcohol use: No   Drug use: No   Sexual activity: Yes  Other Topics Concern   Not on file  Social History Narrative   Lives with his wife.   Social Determinants of Health   Financial Resource Strain: Not on file  Food Insecurity: Not on file  Transportation Needs: Not on file  Physical Activity: Not on file  Stress: Not on file  Social Connections: Not on file  Intimate Partner Violence: Not on file    Outpatient Medications Prior to Visit  Medication Sig Dispense Refill   apixaban (ELIQUIS) 5 MG TABS tablet Take 1 tablet (5 mg total) by mouth 2 (two) times daily. To begin after Eliquis starter pack complete 60 tablet 3   APIXABAN (ELIQUIS) VTE STARTER PACK (10MG  AND 5MG ) Take as directed on package: start with two-5mg  tablets twice daily for 7 days. On day 8, switch to one-5mg  tablet twice daily. 74 tablet 0   No facility-administered medications prior to visit.    No Known Allergies  ROS Review of Systems  Constitutional: Negative.   HENT: Negative.    Eyes: Negative.   Respiratory: Negative.    Cardiovascular:  Positive for leg swelling. Negative for chest  pain and palpitations.  Gastrointestinal: Negative.   Endocrine: Negative.   Genitourinary: Negative.   Musculoskeletal: Negative.   Skin: Negative.   Neurological: Negative.   Psychiatric/Behavioral: Negative.    All other systems reviewed and are negative.    Objective:    Physical Exam Constitutional:      General: He is not in acute distress.    Appearance: Normal appearance. He is normal weight. He is not ill-appearing, toxic-appearing or diaphoretic.  Cardiovascular:     Rate and Rhythm: Normal rate and regular rhythm.     Heart sounds: Normal heart sounds. No murmur heard.   No friction rub. No gallop.  Pulmonary:     Effort: Pulmonary effort is normal. No respiratory distress.     Breath sounds: Normal breath sounds. No stridor. No wheezing, rhonchi  or rales.  Chest:     Chest wall: No tenderness.  Musculoskeletal:     Right lower leg: Edema present.     Left lower leg: Edema (L>R, nonpitting.) present.  Skin:    Capillary Refill: Capillary refill takes less than 2 seconds.     Findings: Erythema (BLE. blanches. stable since previous visit.) present.  Neurological:     General: No focal deficit present.     Mental Status: He is alert and oriented to person, place, and time. Mental status is at baseline.  Psychiatric:        Mood and Affect: Mood normal.        Behavior: Behavior normal.        Thought Content: Thought content normal.        Judgment: Judgment normal.    BP 128/76 (BP Location: Left Arm, Patient Position: Sitting, Cuff Size: Large)   Pulse 82   Temp 98.3 F (36.8 C) (Temporal)   Ht 6\' 4"  (1.93 m)   Wt 298 lb 6.4 oz (135.4 kg)   SpO2 97%   BMI 36.32 kg/m  Wt Readings from Last 3 Encounters:  05/01/21 298 lb 6.4 oz (135.4 kg)  04/20/21 297 lb 3.2 oz (134.8 kg)  04/05/21 295 lb (133.8 kg)     Health Maintenance Due  Topic Date Due   COVID-19 Vaccine (1) Never done   Pneumococcal Vaccine 62-100 Years old (1 - PCV) Never done   HIV Screening  Never done   Hepatitis C Screening  Never done   TETANUS/TDAP  Never done   COLONOSCOPY (Pts 45-57yrs Insurance coverage will need to be confirmed)  Never done   INFLUENZA VACCINE  Never done    There are no preventive care reminders to display for this patient.  Lab Results  Component Value Date   TSH 1.32 04/01/2016   Lab Results  Component Value Date   WBC 8.5 04/20/2021   HGB 16.3 04/20/2021   HCT 49.8 04/20/2021   MCV 91.7 04/20/2021   PLT 330.0 04/20/2021   Lab Results  Component Value Date   NA 139 04/20/2021   K 4.7 04/20/2021   CO2 31 04/20/2021   GLUCOSE 87 04/20/2021   BUN 19 04/20/2021   CREATININE 1.10 04/20/2021   BILITOT 1.1 04/05/2021   ALKPHOS 59 04/05/2021   AST 18 04/05/2021   ALT 22 04/05/2021   PROT 6.8 04/05/2021    ALBUMIN 3.9 04/05/2021   CALCIUM 9.7 04/20/2021   ANIONGAP 6 04/08/2021   GFR 79.39 04/20/2021   Lab Results  Component Value Date   CHOL 161 04/01/2016   Lab Results  Component Value Date  HDL 52 04/01/2016   Lab Results  Component Value Date   LDLCALC 95 04/01/2016   Lab Results  Component Value Date   TRIG 72 04/01/2016   Lab Results  Component Value Date   CHOLHDL 3.1 04/01/2016   Lab Results  Component Value Date   HGBA1C 6.1 04/20/2021      Assessment & Plan:   Problem List Items Addressed This Visit       Cardiovascular and Mediastinum   Bilateral pulmonary embolism ([place]) - Primary   Acute deep vein thrombosis (DVT) of popliteal vein of left lower extremity (HCC)    No orders of the defined types were placed in this encounter.   Follow-up: No follow-ups on file.   PLAN History and exam reassuring. Doubt any worsening DVT or PE.  Discussed reasons to return with patient who voices understanding Patient encouraged to call clinic with any questions, comments, or concerns.  Maximiano Coss, NP

## 2021-05-01 NOTE — Patient Instructions (Signed)
Mr. James Jordan to meet you  In brief:  We need to know if: swelling doesn't reduce with elevation of that leg, new leg or chest pain arises and won't go away, or the affected leg swells and looks angry, red, infected.  It's okay if the leg swells through the day but goes down when you elevate it   Thank you  Denice Paradise

## 2021-05-03 ENCOUNTER — Emergency Department (HOSPITAL_COMMUNITY): Payer: Managed Care, Other (non HMO)

## 2021-05-03 ENCOUNTER — Emergency Department (HOSPITAL_COMMUNITY)
Admission: EM | Admit: 2021-05-03 | Discharge: 2021-05-03 | Disposition: A | Discharge status: Home / Self Care | Payer: Managed Care, Other (non HMO) | Attending: Emergency Medicine | Admitting: Emergency Medicine

## 2021-05-03 ENCOUNTER — Other Ambulatory Visit: Payer: Self-pay

## 2021-05-03 ENCOUNTER — Encounter (HOSPITAL_COMMUNITY): Payer: Self-pay

## 2021-05-03 DIAGNOSIS — R002 Palpitations: Secondary | ICD-10-CM | POA: Insufficient documentation

## 2021-05-03 DIAGNOSIS — Z7901 Long term (current) use of anticoagulants: Secondary | ICD-10-CM | POA: Diagnosis not present

## 2021-05-03 DIAGNOSIS — F17220 Nicotine dependence, chewing tobacco, uncomplicated: Secondary | ICD-10-CM | POA: Insufficient documentation

## 2021-05-03 DIAGNOSIS — R600 Localized edema: Secondary | ICD-10-CM | POA: Insufficient documentation

## 2021-05-03 LAB — COMPREHENSIVE METABOLIC PANEL
ALT: 20 U/L (ref 0–44)
AST: 19 U/L (ref 15–41)
Albumin: 3.7 g/dL (ref 3.5–5.0)
Alkaline Phosphatase: 50 U/L (ref 38–126)
Anion gap: 10 (ref 5–15)
BUN: 14 mg/dL (ref 6–20)
CO2: 23 mmol/L (ref 22–32)
Calcium: 9.2 mg/dL (ref 8.9–10.3)
Chloride: 106 mmol/L (ref 98–111)
Creatinine, Ser: 1.11 mg/dL (ref 0.61–1.24)
GFR, Estimated: >60 mL/min (ref >60)
Glucose, Bld: 139 mg/dL — ABNORMAL HIGH (ref 70–99)
Potassium: 3.8 mmol/L (ref 3.5–5.1)
Sodium: 139 mmol/L (ref 135–145)
Total Bilirubin: 0.6 mg/dL (ref 0.3–1.2)
Total Protein: 6.3 g/dL — ABNORMAL LOW (ref 6.5–8.1)

## 2021-05-03 LAB — CBC WITH DIFFERENTIAL/PLATELET
Abs Immature Granulocytes: 0.05 10*3/uL (ref 0.00–0.07)
Basophils Absolute: 0 10*3/uL (ref 0.0–0.1)
Basophils Relative: 0 %
Eosinophils Absolute: 0.2 10*3/uL (ref 0.0–0.5)
Eosinophils Relative: 2 %
HCT: 46 % (ref 39.0–52.0)
Hemoglobin: 14.8 g/dL (ref 13.0–17.0)
Immature Granulocytes: 1 %
Lymphocytes Relative: 17 %
Lymphs Abs: 1.7 10*3/uL (ref 0.7–4.0)
MCH: 30 pg (ref 26.0–34.0)
MCHC: 32.2 g/dL (ref 30.0–36.0)
MCV: 93.3 fL (ref 80.0–100.0)
Monocytes Absolute: 0.8 10*3/uL (ref 0.1–1.0)
Monocytes Relative: 7 %
Neutro Abs: 7.6 10*3/uL (ref 1.7–7.7)
Neutrophils Relative %: 73 %
Platelets: 268 10*3/uL (ref 150–400)
RBC: 4.93 MIL/uL (ref 4.22–5.81)
RDW: 12.8 % (ref 11.5–15.5)
WBC: 10.3 10*3/uL (ref 4.0–10.5)
nRBC: 0 % (ref 0.0–0.2)

## 2021-05-03 LAB — MAGNESIUM: Magnesium: 1.8 mg/dL (ref 1.7–2.4)

## 2021-05-03 LAB — TROPONIN I (HIGH SENSITIVITY)
Troponin I (High Sensitivity): 5 ng/L (ref <18)
Troponin I (High Sensitivity): 9 ng/L (ref <18)

## 2021-05-03 LAB — TSH: TSH: 1.575 u[IU]/mL (ref 0.350–4.500)

## 2021-05-03 MED ORDER — LACTATED RINGERS IV BOLUS
500.0000 mL | Freq: Once | INTRAVENOUS | Status: AC
  Administered 2021-05-03: 500 mL via INTRAVENOUS

## 2021-05-03 NOTE — ED Triage Notes (Addendum)
Pt came in the ED via POV from home d/t a "15 minute episode of rapid heart beat" that took place at approx. 1700 prior to arrival. Pt reports being diagnosed with a blood clot in his legs and lungs 4 weeks ago. A/Ox4 upon arrival, no c/o pain.

## 2021-05-03 NOTE — Discharge Instructions (Signed)
Dear James Jordan,  Thank you for allowing [place] to take care of you today.  We hope you begin feeling better soon.  - Please follow-up with your primary care physician or schedule an appointment to establish a primary care doctor if you do not have one already. - Please return to the Emergency Department or call 911 for chest pain, shortness of breath, severe pain, altered mental status, or if you have any reason to think you may need emergency medical care. - Remember to stay well hydrated. Avoid excessive caffeine as discussed. -May continue your normal home medications as prescribed -Call your PCP for close follow-up in the next week   Sincerely,  Cherly Hensen, DO Department of Emergency Medicine [place]   Palpitations

## 2021-05-03 NOTE — ED Provider Notes (Signed)
**James James** James James   CSN: [id number] Arrival date & time: 05/03/21  1810   History Chief Complaint  Patient presents with   Rapid Heartbeat    James James is a 50 y.o. male.  HPI  50 year old male with PMH significant for recent DVT and PE on Eliquis, obesity, pulmonary hypertension, and others as below who presents to the ED with complaints of palpitations.  Patient reports that at 5:30 PM today, he had sudden onset of perceived rapid heartbeat for approximately 15 minutes.  He reports a "twinge" in his left shoulder that preceded the incident.  He called EMS, but the symptoms resolved and on EMS arrival he was reportedly not tachycardic.  He denies any recent illnesses or injuries.  Denies fevers or chills, dizziness or lightheadedness, diaphoresis, chest pain, shortness of breath or cough, abdominal pain, nausea or vomiting, altered mental status, weakness, jaw or arm pain, or any other concerns.  He denies any prior history of MI or stroke.  Reports compliance on his Eliquis, last dose was 8:00 AM today.  Denies any drug use, endorses caffeine use and states he drank 3 cups of coffee today and a soda instead of his usual single caffeinated beverage.  Endorses significant associated anxiety.  Past Medical History:  Diagnosis Date   ACL tear - left knee 2002   Per patient, not repaired    Umbilical hernia 2542    Patient Active Problem List   Diagnosis Date Noted   Bilateral pulmonary embolism ([place]) 04/05/2021   Acute deep vein thrombosis (DVT) of popliteal vein of left lower extremity (HCC)    PAC (premature atrial contraction) 06/01/2016   BMI 35.0-35.9,adult 04/01/2016    Past Surgical History:  Procedure Laterality Date   HERNIA REPAIR  7062   Umbilical       Family History  Problem Relation Age of Onset   Diabetes Mother    Diabetes Maternal Grandfather    Lung cancer Paternal Grandmother    Lung cancer Paternal  Grandfather    Heart disease Maternal Aunt    Diabetes Maternal Uncle     Social History   Tobacco Use   Smoking status: Never   Smokeless tobacco: Current    Types: Snuff  Substance Use Topics   Alcohol use: No   Drug use: No    Home Medications Prior to Admission medications   Medication Sig Start Date End Date Taking? Authorizing Provider  APIXABAN (ELIQUIS) VTE STARTER PACK (10MG  AND 5MG ) Take as directed on package: start with two-5mg  tablets twice daily for 7 days. On day 8, switch to one-5mg  tablet twice daily. 04/08/21  Yes Ghimire, Henreitta Leber, MD  apixaban (ELIQUIS) 5 MG TABS tablet Take 1 tablet (5 mg total) by mouth 2 (two) times daily. To begin after Eliquis starter pack complete 04/08/21   Ghimire, Henreitta Leber, MD    Allergies    Patient has no known allergies.  Review of Systems   Review of Systems  Constitutional:  Negative for activity change, appetite change, chills, diaphoresis, fatigue and fever.  Eyes:  Negative for visual disturbance.  Respiratory:  Negative for cough, chest tightness and shortness of breath.   Cardiovascular:  Positive for palpitations and leg swelling. Negative for chest pain.  Gastrointestinal:  Negative for abdominal distention, abdominal pain, nausea and vomiting.  Musculoskeletal:  Negative for arthralgias, back pain, neck pain and neck stiffness.  Skin:  Negative for color change, pallor and rash.  Allergic/Immunologic:  Negative for immunocompromised state.  Neurological:  Negative for dizziness, tremors, seizures, syncope, weakness, light-headedness, numbness and headaches.  Psychiatric/Behavioral:  Negative for confusion. The patient is nervous/anxious.   All other systems reviewed and are negative.  Physical Exam Updated Vital Signs BP 124/85   Pulse (!) 57   Temp 98.2 F (36.8 C) (Oral)   Resp 11   SpO2 96%   Physical Exam Vitals and nursing James reviewed.  Constitutional:      General: He is not in acute distress.     Appearance: Normal appearance. He is well-developed. He is obese. He is not ill-appearing, toxic-appearing or diaphoretic.  HENT:     Head: Normocephalic and atraumatic.     Right Ear: External ear normal.     Left Ear: External ear normal.     Nose: Nose normal.     Mouth/Throat:     Mouth: Mucous membranes are dry.     Pharynx: Oropharynx is clear. No oropharyngeal exudate or posterior oropharyngeal erythema.  Eyes:     General: No scleral icterus.    Extraocular Movements: Extraocular movements intact.     Conjunctiva/sclera: Conjunctivae normal.     Pupils: Pupils are equal, round, and reactive to light.  Cardiovascular:     Rate and Rhythm: Normal rate and regular rhythm.     Pulses: Normal pulses.     Heart sounds: Normal heart sounds. No murmur heard.    Comments: Distal LE edema, RLE > LLE. Decreased from prior per pt after DVT Pulmonary:     Effort: Pulmonary effort is normal. No tachypnea, bradypnea or respiratory distress.     Breath sounds: Normal breath sounds and air entry. No wheezing.  Chest:     Chest wall: No tenderness or crepitus.  Abdominal:     General: Abdomen is flat. There is no distension.     Palpations: Abdomen is soft.     Tenderness: There is no abdominal tenderness.  Musculoskeletal:        General: No swelling. Normal range of motion.     Cervical back: Normal range of motion and neck supple. No rigidity or tenderness.     Right lower leg: Edema present.     Left lower leg: Edema present.  Lymphadenopathy:     Cervical: No cervical adenopathy.  Skin:    General: Skin is warm and dry.     Capillary Refill: Capillary refill takes less than 2 seconds.     Coloration: Skin is not pale.  Neurological:     General: No focal deficit present.     Mental Status: He is alert and oriented to person, place, and time. Mental status is at baseline.  Psychiatric:        Mood and Affect: Mood normal.        Behavior: Behavior normal.    ED Results /  Procedures / Treatments   Labs (all labs ordered are listed, but only abnormal results are displayed) Labs Reviewed  COMPREHENSIVE METABOLIC PANEL - Abnormal; Notable for the following components:      Result Value   Glucose, Bld 139 (*)    Total Protein 6.3 (*)    All other components within normal limits  CBC WITH DIFFERENTIAL/PLATELET  TSH  MAGNESIUM  TROPONIN I (HIGH SENSITIVITY)  TROPONIN I (HIGH SENSITIVITY)    EKG EKG Interpretation  Date/Time:  Sunday May 03 2021 18:30:52 EST Ventricular Rate:  79 PR Interval:  134 QRS Duration: 93 QT Interval:  373 QTC  Calculation: 428 R Axis:   69 Text Interpretation: Sinus rhythm LAE, consider biatrial enlargement Since last tracing rate slower Confirmed by Dorie Rank [phone]) on 05/03/2021 6:32:53 PM  Radiology DG Chest Portable 1 View  Result Date: 05/03/2021 CLINICAL DATA:  Tachycardia EXAM: PORTABLE CHEST 1 VIEW COMPARISON:  10/19/2018 FINDINGS: The heart size and mediastinal contours are within normal limits. Both lungs are clear. The visualized skeletal structures are unremarkable. IMPRESSION: No active disease. Electronically Signed   By: Fidela Salisbury M.D.   On: 05/03/2021 19:50    Procedures Procedures   Medications Ordered in ED Medications  lactated ringers bolus 500 mL (0 mLs Intravenous Stopped 05/03/21 2028)    ED Course  I have reviewed the triage vital signs and the nursing notes.  Pertinent labs & imaging results that were available during my care of the patient were reviewed by me and considered in my medical decision making (see chart for details).    MDM Rules/Calculators/A&P HEAR Score: 3                         Jakeem L Champoux is a 50 y.o. male presenting with palpitations. Initial VS wnl.  EKG interpretation: NSR, rate 79 bpm, normal intervals, no ST elevations or depressions, left atrial enlargement, no significant change from prior.  Labs: CBC and CMP unremarkable.  Magnesium and TSH  WNL.  Troponin of 5, repeat 9.  Imaging: XR negative for acute cardiopulmonary abnormalities. Imaging was reviewed by radiology and personally by me.  DDX considered: Dehydration, arrhythmia, ACS, electrolyte disturbance, SVT, atrial fibrillation, PE, pneumonia, pneumothorax. History, examination, and objective data most consistent with brief episode of palpitations of unclear etiology.  Patient has no systemic infectious signs or symptoms.  Low concern for emergent etiology, suspect brief tachycardia episode secondary to increased caffeine use.  Patient compliant on home anticoagulation and has no sign of increased work of breathing, tachycardia, or hypoxia on room air raising suspicion for progression or worsening of PE.  Lower extremity edema from prior DVT appears improved from previous.  Patient denies chest pain.  Given low HEAR score and multiple negative troponins, I believe patient is safe for discharge from a chest pain standpoint.  No respiratory symptoms, increased work of breathing, or x-ray findings concerning for pulmonary etiology.  Patient is overall very well-appearing, sleeping on multiple exams.   Medications: Medications  lactated ringers bolus 500 mL (0 mLs Intravenous Stopped 05/03/21 2028)    Re-evaluated prior to discharge. Hemodynamically stable and in no acute distress.  Symptoms have not recurred while patient was in the ED, and remained in normal sinus rhythm without tachycardia during observation.  Discharged home in stable condition. Strict ED return precautions advised. Supportive care discussed. Outpatient PCP or cardiology follow-up advised. Patient understands and agrees with the plan. Family updated at the bedside.  The plan for this patient was discussed with my attending physician, who voiced agreement and who oversaw evaluation and treatment of this patient.     James: Estate manager/land agent was used in the creation of this James.  Final Clinical  Impression(s) / ED Diagnoses Final diagnoses:  Palpitations    Rx / DC Orders ED Discharge Orders     None        Cherly Hensen, DO 05/04/21 2585    Dorie Rank, MD 05/05/21 [phone]

## 2021-05-04 NOTE — Telephone Encounter (Signed)
Call made to patient, confirmed DOB. Patient states he has a lot of appt at this time and will call back when he is ready to schedule. Office number and address given. Patient states he will call when he can.   Nothing further needed at this time.

## 2021-05-28 ENCOUNTER — Emergency Department (HOSPITAL_COMMUNITY)
Admission: EM | Admit: 2021-05-28 | Discharge: 2021-05-28 | Disposition: A | Discharge status: Home / Self Care | Payer: Managed Care, Other (non HMO) | Attending: Student | Admitting: Student

## 2021-05-28 ENCOUNTER — Other Ambulatory Visit: Payer: Self-pay

## 2021-05-28 ENCOUNTER — Emergency Department (HOSPITAL_BASED_OUTPATIENT_CLINIC_OR_DEPARTMENT_OTHER): Payer: Managed Care, Other (non HMO)

## 2021-05-28 ENCOUNTER — Emergency Department (HOSPITAL_COMMUNITY): Payer: Managed Care, Other (non HMO)

## 2021-05-28 DIAGNOSIS — Z7901 Long term (current) use of anticoagulants: Secondary | ICD-10-CM | POA: Diagnosis not present

## 2021-05-28 DIAGNOSIS — Z8571 Personal history of Hodgkin lymphoma: Secondary | ICD-10-CM | POA: Insufficient documentation

## 2021-05-28 DIAGNOSIS — R5383 Other fatigue: Secondary | ICD-10-CM | POA: Insufficient documentation

## 2021-05-28 DIAGNOSIS — Z86718 Personal history of other venous thrombosis and embolism: Secondary | ICD-10-CM

## 2021-05-28 DIAGNOSIS — M7989 Other specified soft tissue disorders: Secondary | ICD-10-CM | POA: Diagnosis not present

## 2021-05-28 DIAGNOSIS — Z20822 Contact with and (suspected) exposure to covid-19: Secondary | ICD-10-CM | POA: Insufficient documentation

## 2021-05-28 DIAGNOSIS — I825Y2 Chronic embolism and thrombosis of unspecified deep veins of left proximal lower extremity: Secondary | ICD-10-CM

## 2021-05-28 DIAGNOSIS — R202 Paresthesia of skin: Secondary | ICD-10-CM

## 2021-05-28 DIAGNOSIS — R531 Weakness: Secondary | ICD-10-CM | POA: Diagnosis not present

## 2021-05-28 LAB — CBC WITH DIFFERENTIAL/PLATELET
Abs Immature Granulocytes: 0.02 10*3/uL (ref 0.00–0.07)
Basophils Absolute: 0 10*3/uL (ref 0.0–0.1)
Basophils Relative: 0 %
Eosinophils Absolute: 0.1 10*3/uL (ref 0.0–0.5)
Eosinophils Relative: 1 %
HCT: 49.7 % (ref 39.0–52.0)
Hemoglobin: 16.5 g/dL (ref 13.0–17.0)
Immature Granulocytes: 0 %
Lymphocytes Relative: 19 %
Lymphs Abs: 1.8 10*3/uL (ref 0.7–4.0)
MCH: 30.7 pg (ref 26.0–34.0)
MCHC: 33.2 g/dL (ref 30.0–36.0)
MCV: 92.6 fL (ref 80.0–100.0)
Monocytes Absolute: 0.7 10*3/uL (ref 0.1–1.0)
Monocytes Relative: 7 %
Neutro Abs: 6.6 10*3/uL (ref 1.7–7.7)
Neutrophils Relative %: 73 %
Platelets: 306 10*3/uL (ref 150–400)
RBC: 5.37 MIL/uL (ref 4.22–5.81)
RDW: 13.3 % (ref 11.5–15.5)
WBC: 9.1 10*3/uL (ref 4.0–10.5)
nRBC: 0 % (ref 0.0–0.2)

## 2021-05-28 LAB — BASIC METABOLIC PANEL
Anion gap: 8 (ref 5–15)
BUN: 16 mg/dL (ref 6–20)
CO2: 25 mmol/L (ref 22–32)
Calcium: 9.4 mg/dL (ref 8.9–10.3)
Chloride: 104 mmol/L (ref 98–111)
Creatinine, Ser: 1.21 mg/dL (ref 0.61–1.24)
GFR, Estimated: >60 mL/min (ref >60)
Glucose, Bld: 110 mg/dL — ABNORMAL HIGH (ref 70–99)
Potassium: 4.5 mmol/L (ref 3.5–5.1)
Sodium: 137 mmol/L (ref 135–145)

## 2021-05-28 LAB — RESP PANEL BY RT-PCR (FLU A&B, COVID) ARPGX2
Influenza A by PCR: NEGATIVE
Influenza B by PCR: NEGATIVE
SARS Coronavirus 2 by RT PCR: NEGATIVE

## 2021-05-28 LAB — MAGNESIUM: Magnesium: 2 mg/dL (ref 1.7–2.4)

## 2021-05-28 MED ORDER — RIVAROXABAN (XARELTO) VTE STARTER PACK (15 & 20 MG): ORAL_TABLET | ORAL | 0 refills | Status: DC

## 2021-05-28 MED ORDER — RIVAROXABAN 15 MG PO TABS
15.0000 mg | ORAL_TABLET | Freq: Once | ORAL | Status: AC
  Administered 2021-05-28: 15 mg via ORAL
  Filled 2021-05-28: qty 1

## 2021-05-28 MED ORDER — IOHEXOL 350 MG/ML SOLN
100.0000 mL | Freq: Once | INTRAVENOUS | Status: AC | PRN
  Administered 2021-05-28: 100 mL via INTRAVENOUS

## 2021-05-28 MED ORDER — RIVAROXABAN (XARELTO) EDUCATION KIT FOR DVT/PE PATIENTS
PACK | Freq: Once | Status: AC
  Filled 2021-05-28: qty 1

## 2021-05-28 NOTE — ED Notes (Signed)
Discharge instructions reviewed and explained, pt verbalized understanding. Pharmacy tech educated pt on xarelto and answered pt's questions.

## 2021-05-28 NOTE — ED Provider Notes (Signed)
Care of the patient assumed at the change of shift. Recent diagnosis of DVT/PE, on Eliquis. Initially having some finger tingling which has resolved but also found to have persistent DVT. Awaiting CT to eval PE burden and to eval recurrence of lymphoma.  Physical Exam  BP 133/88    Pulse 66    Temp 98.3 F (36.8 C) (Oral)    Resp 13    SpO2 98%   Physical Exam Resting comfortably No distress ED Course/Procedures     Procedures  MDM  CT shows improved PE, no signs of lymphoma. Per prior shift plan is to switch from Eliquis to Xarelto pending Heme follow up. Pharmacy to help determine if he needs loading of Xarelto or not prior to discharge.        Truddie Hidden, MD 05/28/21 [phone]

## 2021-05-28 NOTE — ED Provider Notes (Signed)
[facility] [facility] EMERGENCY DEPARTMENT Provider Note   CSN: [id number] Arrival date & time: 05/28/21  1004     History Chief Complaint  Patient presents with   Weakness    James Jordan is a 50 y.o. male with PMH lymphoma in remission, recent admission in October 2022 for suspected unprovoked left lower extremity DVT and bilateral PE ultimately transition to Eliquis who presents the emergency department for evaluation of a tingling sensation and generalized weakness over the last 2 days, as well as persistent left lower extremity swelling and discoloration.  He states that after working on his leg all day he noticed that his foot turns blue.  He has been vigorously compliant with his Eliquis and has not missed any doses.  He denies numbness, tingling, weakness of lower extremity.  Denies chest pain, shortness of breath, abdominal pain, nausea, vomiting or any other systemic symptoms.  Patient states that his generalized weakness and tingling sensation have since resolved after waiting in the lobby.   Weakness Associated symptoms: no abdominal pain, no arthralgias, no chest pain, no cough, no dysuria, no fever, no seizures, no shortness of breath and no vomiting       Past Medical History:  Diagnosis Date   ACL tear - left knee 2002   Per patient, not repaired    Umbilical hernia 8841    Patient Active Problem List   Diagnosis Date Noted   Bilateral pulmonary embolism ([place]) 04/05/2021   Acute deep vein thrombosis (DVT) of popliteal vein of left lower extremity (HCC)    PAC (premature atrial contraction) 06/01/2016   BMI 35.0-35.9,adult 04/01/2016    Past Surgical History:  Procedure Laterality Date   HERNIA REPAIR  6606   Umbilical       Family History  Problem Relation Age of Onset   Diabetes Mother    Diabetes Maternal Grandfather    Lung cancer Paternal Grandmother    Lung cancer Paternal Grandfather    Heart disease Maternal Aunt    Diabetes  Maternal Uncle     Social History   Tobacco Use   Smoking status: Never   Smokeless tobacco: Current    Types: Snuff  Substance Use Topics   Alcohol use: No   Drug use: No    Home Medications Prior to Admission medications   Medication Sig Start Date End Date Taking? Authorizing Provider  apixaban (ELIQUIS) 5 MG TABS tablet Take 1 tablet (5 mg total) by mouth 2 (two) times daily. To begin after Eliquis starter pack complete 04/08/21  Yes Ghimire, Henreitta Leber, MD  APIXABAN [place]) VTE STARTER PACK (10MG  AND 5MG ) Take as directed on package: start with two-5mg  tablets twice daily for 7 days. On day 8, switch to one-5mg  tablet twice daily. Patient not taking: Reported on 05/28/2021 04/08/21   Jonetta Osgood, MD    Allergies    Patient has no known allergies.  Review of Systems   Review of Systems  Constitutional:  Negative for chills and fever.  HENT:  Negative for ear pain and sore throat.   Eyes:  Negative for pain and visual disturbance.  Respiratory:  Negative for cough and shortness of breath.   Cardiovascular:  Positive for leg swelling. Negative for chest pain and palpitations.  Gastrointestinal:  Negative for abdominal pain and vomiting.  Genitourinary:  Negative for dysuria and hematuria.  Musculoskeletal:  Negative for arthralgias and back pain.  Skin:  Negative for color change and rash.  Neurological:  Positive  for weakness. Negative for seizures and syncope.  All other systems reviewed and are negative.  Physical Exam Updated Vital Signs BP 124/79    Pulse 70    Temp 98.3 F (36.8 C) (Oral)    Resp 16    SpO2 99%   Physical Exam Vitals and nursing note reviewed.  Constitutional:      General: He is not in acute distress.    Appearance: He is well-developed.  HENT:     Head: Normocephalic and atraumatic.  Eyes:     Conjunctiva/sclera: Conjunctivae normal.  Cardiovascular:     Rate and Rhythm: Normal rate and regular rhythm.     Heart sounds: No  murmur heard. Pulmonary:     Effort: Pulmonary effort is normal. No respiratory distress.     Breath sounds: Normal breath sounds.  Abdominal:     Palpations: Abdomen is soft.     Tenderness: There is no abdominal tenderness.  Musculoskeletal:        General: No swelling.     Cervical back: Neck supple.     Left lower leg: Edema (Asymmetric left lower extremity edema, pulses intact) present.  Skin:    General: Skin is warm and dry.     Capillary Refill: Capillary refill takes less than 2 seconds.  Neurological:     Mental Status: He is alert.  Psychiatric:        Mood and Affect: Mood normal.    ED Results / Procedures / Treatments   Labs (all labs ordered are listed, but only abnormal results are displayed) Labs Reviewed  BASIC METABOLIC PANEL - Abnormal; Notable for the following components:      Result Value   Glucose, Bld 110 (*)    All other components within normal limits  RESP PANEL BY RT-PCR (FLU A&B, COVID) ARPGX2  CBC WITH DIFFERENTIAL/PLATELET  MAGNESIUM    EKG None  Radiology VAS [place] LOWER EXTREMITY VENOUS (DVT) (ONLY [facility] & [facility])  Result Date: 05/28/2021  Lower Venous DVT Study Patient Name:  James Jordan  Date of Exam:   05/28/2021 Medical Rec #: [id number]         Accession #:    [id number] Date of Birth: 1970/07/01         Patient Gender: M Patient Age:   70 years Exam Location:  [facility] Procedure:      VAS [place] LOWER EXTREMITY VENOUS (DVT) Referring Phys: Debbe Mounts --------------------------------------------------------------------------------  Indications: Intermittent swelling in left leg. History of acute DVT in left popliteal vein and calf veins on 04/05/21.  Risk Factors: PE on 04/05/21. Anticoagulation: Eliquis. Comparison Study: 04/05/21 - LLE venous - positive for acute DVT. Performing Technologist: Oda Cogan RDMS, RVT  Examination Guidelines: A complete evaluation includes B-mode imaging, spectral Doppler, color Doppler, and  power Doppler as needed of all accessible portions of each vessel. Bilateral testing is considered an integral part of a complete examination. Limited examinations for reoccurring indications may be performed as noted. The reflux portion of the exam is performed with the patient in reverse Trendelenburg.  +-----+---------------+---------+-----------+----------+--------------+  RIGHT Compressibility Phasicity Spontaneity Properties Thrombus Aging  +-----+---------------+---------+-----------+----------+--------------+  CFV   Full            Yes       Yes                                    +-----+---------------+---------+-----------+----------+--------------+  SFJ   Full                                                             +-----+---------------+---------+-----------+----------+--------------+   +---------+---------------+---------+-----------+----------+-----------------+  LEFT      Compressibility Phasicity Spontaneity Properties Thrombus Aging     +---------+---------------+---------+-----------+----------+-----------------+  CFV       Full            Yes       Yes                                       +---------+---------------+---------+-----------+----------+-----------------+  SFJ       Full                                                                +---------+---------------+---------+-----------+----------+-----------------+  FV Prox   Full                                                                +---------+---------------+---------+-----------+----------+-----------------+  FV Mid    Full                                                                +---------+---------------+---------+-----------+----------+-----------------+  FV Distal Partial                                          Chronic            +---------+---------------+---------+-----------+----------+-----------------+  PFV       Full                                                                 +---------+---------------+---------+-----------+----------+-----------------+  POP       None            No        No                     Age Indeterminate  +---------+---------------+---------+-----------+----------+-----------------+  PTV       Partial  Age Indeterminate  +---------+---------------+---------+-----------+----------+-----------------+  PERO      None                                             Age Indeterminate  +---------+---------------+---------+-----------+----------+-----------------+ Subacute DVT in the popliteal vein and calf veins.   Summary: RIGHT: - No evidence of common femoral vein obstruction.  LEFT: - Findings consistent with age indeterminate deep vein thrombosis involving the left popliteal vein, left posterior tibial veins, and left peroneal veins. - Findings appear essentially unchanged compared to previous examination.  *See table(s) above for measurements and observations.    Preliminary     Procedures Procedures   Medications Ordered in ED Medications - No data to display  ED Course  I have reviewed the triage vital signs and the nursing notes.  Pertinent labs & imaging results that were available during my care of the patient were reviewed by me and considered in my medical decision making (see chart for details).    MDM Rules/Calculators/A&P                          Patient seen emergency department for evaluation of multiple complaints as described above.  Physical exam reveals asymmetric left lower extremity edema with mild erythema but no tenderness to palpation.  Laboratory evaluation unremarkable, COVID and flu negative.  DVT ultrasound of the leg showing persistent clot burden that is unchanged from October.  Due to this concerning treatment failure, I spoke with oncology (Dr. Alvy Bimler) who recommended that in the setting of his lymphoma, although in remission, he should receive screening CT scans to ensure that  the patient does not have active cancer causing his treatment failure.  Dr. Alvy Bimler has graciously agreed to see the patient for follow-up on 12/20 if the lymphoma imaging is negative.  If positive, the patient will have to follow-up with [facility] oncology.  Patient was signed out to oncoming provider with the scans currently pending.  He has no chest pain or shortness of breath and I have low concern for PE but we will obtain a PE study as well as a CT abdomen pelvis with contrast to ensure that he receives proper cancer screening.   Final Clinical Impression(s) / ED Diagnoses Final diagnoses:  None    Rx / DC Orders ED Discharge Orders     None        Sudie Bandel, Debe Coder, MD 05/28/21 [phone]

## 2021-05-28 NOTE — ED Provider Notes (Addendum)
Emergency Medicine Provider Triage Evaluation Note  James Jordan , a 50 y.o. male  was evaluated in triage.  Pt complains of tingling sensation and generalized weakness over the last 2 days.  Patient reports that tingling sensation is located to bilateral hands   Patient also complains of intermittent swelling to left lower extremity and dorsum of foot turning "blue."  Patient reports that swelling and discoloration has been intermittent over the last 2 months after being diagnosed with DVT.  Patient is currently taking Eliquis.  Swelling improves with elevation.    Review of Systems  Positive: Leg swelling, paresthesia, generalized weakness Negative: Fevers, chills, blood in stool, melena  Physical Exam  BP 126/86 (BP Location: Left Arm)    Pulse 74    Temp 98.3 F (36.8 C) (Oral)    Resp 17    SpO2 97%  Gen:   Awake, no distress   Resp:  Normal effort  MSK:   Moves extremities without difficulty  Other:  +2  left dp pulse   Medical Decision Making  Medically screening exam initiated at 11:16 AM.  Appropriate orders placed.  James Jordan was informed that the remainder of the evaluation will be completed by another provider, this initial triage assessment does not replace that evaluation, and the importance of remaining in the ED until their evaluation is complete.     Loni Beckwith, PA-C 05/28/21 1117    Loni Beckwith, PA-C 05/28/21 1121    Loni Beckwith, PA-C 05/28/21 1134    Loni Beckwith, PA-C 05/28/21 1149    Tegeler, Gwenyth Allegra, MD 05/28/21 [phone]

## 2021-05-28 NOTE — ED Triage Notes (Signed)
Pt. Stated, I had blood clots back in oct. And put on Elaquis, for 2 days Ive had some tingling and feeling tired for 2 days. Ive had some left leg pain and swelling and blue.

## 2021-05-28 NOTE — Progress Notes (Signed)
Left lower ext venous duplex  has been completed. Refer to [facility] under chart review to view preliminary results.   05/28/2021  1:22 PM Taryn Shellhammer, Bonnye Fava

## 2021-06-02 ENCOUNTER — Other Ambulatory Visit: Payer: Self-pay

## 2021-06-02 ENCOUNTER — Encounter: Payer: Self-pay | Admitting: Hematology and Oncology

## 2021-06-02 ENCOUNTER — Inpatient Hospital Stay: Payer: Managed Care, Other (non HMO) | Attending: Hematology and Oncology | Admitting: Hematology and Oncology

## 2021-06-02 DIAGNOSIS — K5730 Diverticulosis of large intestine without perforation or abscess without bleeding: Secondary | ICD-10-CM | POA: Diagnosis not present

## 2021-06-02 DIAGNOSIS — Z801 Family history of malignant neoplasm of trachea, bronchus and lung: Secondary | ICD-10-CM | POA: Insufficient documentation

## 2021-06-02 DIAGNOSIS — I2783 Eisenmenger's syndrome: Secondary | ICD-10-CM

## 2021-06-02 DIAGNOSIS — I2699 Other pulmonary embolism without acute cor pulmonale: Secondary | ICD-10-CM | POA: Diagnosis not present

## 2021-06-02 DIAGNOSIS — Z7901 Long term (current) use of anticoagulants: Secondary | ICD-10-CM | POA: Insufficient documentation

## 2021-06-02 DIAGNOSIS — K760 Fatty (change of) liver, not elsewhere classified: Secondary | ICD-10-CM | POA: Diagnosis not present

## 2021-06-02 DIAGNOSIS — Z8249 Family history of ischemic heart disease and other diseases of the circulatory system: Secondary | ICD-10-CM | POA: Insufficient documentation

## 2021-06-02 DIAGNOSIS — R600 Localized edema: Secondary | ICD-10-CM | POA: Diagnosis not present

## 2021-06-02 DIAGNOSIS — M7989 Other specified soft tissue disorders: Secondary | ICD-10-CM | POA: Diagnosis not present

## 2021-06-02 DIAGNOSIS — I2782 Chronic pulmonary embolism: Secondary | ICD-10-CM | POA: Diagnosis not present

## 2021-06-02 DIAGNOSIS — R202 Paresthesia of skin: Secondary | ICD-10-CM | POA: Diagnosis not present

## 2021-06-02 DIAGNOSIS — Z833 Family history of diabetes mellitus: Secondary | ICD-10-CM | POA: Insufficient documentation

## 2021-06-02 DIAGNOSIS — Z79899 Other long term (current) drug therapy: Secondary | ICD-10-CM | POA: Diagnosis not present

## 2021-06-02 DIAGNOSIS — E669 Obesity, unspecified: Secondary | ICD-10-CM | POA: Diagnosis not present

## 2021-06-02 DIAGNOSIS — K4090 Unilateral inguinal hernia, without obstruction or gangrene, not specified as recurrent: Secondary | ICD-10-CM | POA: Insufficient documentation

## 2021-06-02 DIAGNOSIS — I82432 Acute embolism and thrombosis of left popliteal vein: Secondary | ICD-10-CM | POA: Insufficient documentation

## 2021-06-02 DIAGNOSIS — Z6835 Body mass index (BMI) 35.0-35.9, adult: Secondary | ICD-10-CM | POA: Insufficient documentation

## 2021-06-02 NOTE — Assessment & Plan Note (Addendum)
The patient was diagnosed with acute DVT 2 months ago, likely provoked by dehydration and [place] the [place] He was treated appropriately with Eliquis but then complained of intermittent paresthesia on the left leg Repeat ultrasound venous Doppler did not confirm worsening or new acute DVT  He will continue anticoagulation therapy for 3 to 6 months We discussed importance of adequate hydration

## 2021-06-02 NOTE — Assessment & Plan Note (Signed)
Repeat CT angiogram show partial resolution of prior pulmonary emboli Overall, I did not feel that the patient failed Eliquis After he complete current prescription of Xarelto, he will go back to his previous dose of Eliquis I plan to see him in October of next year, approximately a year away from treatment and we will discuss the risk and benefits of discontinuation of anticoagulation therapy

## 2021-06-02 NOTE — Assessment & Plan Note (Signed)
He has class II obesity Recent CT imaging show hepatic steatosis We discussed importance of weight loss and exercise and a healthy lifestyle to avoid recurrent thrombosis

## 2021-06-02 NOTE — Progress Notes (Signed)
[facility] CONSULT NOTE  Patient Care Team: Wendie Agreste, MD as PCP - General (Family Medicine)  ASSESSMENT & PLAN:  Acute deep vein thrombosis (DVT) of popliteal vein of left lower extremity ([place]) The patient was diagnosed with acute DVT 2 months ago, likely provoked by dehydration and his recent travel to the beach He was treated appropriately with Eliquis but then complained of intermittent paresthesia on the left leg Repeat ultrasound venous Doppler did not confirm worsening or new acute DVT  He will continue anticoagulation therapy for 3 to 6 months We discussed importance of adequate hydration  Bilateral pulmonary embolism ([place]) Repeat CT angiogram show partial resolution of prior pulmonary emboli Overall, I did not feel that the patient failed Eliquis After he complete current prescription of Xarelto, he will go back to his previous dose of Eliquis I plan to see him in October of next year, approximately a year away from treatment and we will discuss the risk and benefits of discontinuation of anticoagulation therapy  Obesity, Class II, BMI 35-39.9 He has class II obesity Recent CT imaging show hepatic steatosis We discussed importance of weight loss and exercise and a healthy lifestyle to avoid recurrent thrombosis  Bilateral lower extremity edema 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 The patient knows to call the clinic with any problems, questions or concerns. The total time spent in the  appointment was 60 minutes encounter with patients including review of chart and various tests results, discussions about plan of care and coordination of care plan  James Lark, MD 12/20/20222:36 PM  CHIEF COMPLAINTS/PURPOSE OF CONSULTATION:  Left lower extremity DVT and bilateral PE  HISTORY OF PRESENTING ILLNESS:  James Jordan 50 y.o. male is here because of recent ER visit The patient had traveled to the beach several months ago and has been complaining of intermittent left lower extremity achiness He has intermittent leg swelling, tenderness but denies trauma to the left lower extremity Previously, he had a very stressful job but over the past 2 months, he had moved to a different role.   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     LEFT:  - Findings consistent with acute deep vein thrombosis involving the left popliteal vein.  - Findings consistent with age indeterminate deep vein thrombosis involving the left femoral vein, left peroneal veins, and left posterior tibial veins.   CT angiogram showed 1. Large volume of pulmonary emboli in the lungs bilaterally, with dilatation of the right atrium and right ventricle (RV to LV ratio of 1.1) indicative of elevated right-sided heart pressures and right heart strain. These findings have been shown to be associated with a increased morbidity and mortality in the setting pulmonary embolism. 2. No  evidence of pulmonary infarct or significant pleural effusion. 3. Hepatic steatosis.  He was admitted and appropriately anticoagulated and was discharged on Eliquis He is compliant taking Eliquis as directed Recently, he felt some weird tingling sensation and he presented to the emergency department for  evaluation  On May 28, 2021, repeat venous Doppler ultrasound showed  RIGHT:  - No evidence of common femoral vein obstruction.     LEFT:  - Findings consistent with age indeterminate deep vein thrombosis involving the left popliteal vein, left posterior tibial veins, and left peroneal veins.  - Findings appear essentially unchanged compared to previous examination  Repeat CT imaging of the chest, abdomen and pelvis showed 1. Sequela of chronic pulmonary emboli, with thin residual synechia within the bilateral lower lobe pulmonary arteries. The significant majority of the pulmonary emboli seen previously have resolved in the interim. 2. No acute pulmonary embolus. 3. Hepatic steatosis. 4. Colonic diverticulosis without diverticulitis. 5. Fat containing umbilical and right inguinal hernias. No bowel herniation.   He had prior surgeries before and never had perioperative thromboembolic events. The patient had never been on testosterone replacement therapy  There is no family history of blood clots or miscarriages. He denies bleeding complications from anticoagulation therapy Upon discharge from the emergency department last week, he felt better He has less leg swelling He admits he is not drinking enough water, probably amount to 60 ounces of liquid per day  MEDICAL HISTORY:  Past Medical History:  Diagnosis Date   ACL tear - left knee 2002   Per patient, not repaired    Umbilical hernia [id number]    SURGICAL HISTORY: Past Surgical History:  Procedure Laterality Date   HERNIA REPAIR  1275   Umbilical    SOCIAL HISTORY: Social History   Socioeconomic History   Marital status: Married    Spouse name: Not on file   Number of children: Not on file   Years of education: Not on file   Highest education level: Not on file  Occupational History   Occupation: warehouse  Tobacco Use   Smoking status: Never   Smokeless tobacco: Current    Types: Snuff  Substance and Sexual  Activity   Alcohol use: No   Drug use: No   Sexual activity: Yes  Other Topics Concern   Not on file  Social History Narrative   Lives with his wife.   Social Determinants of Health   Financial Resource Strain: Not on file  Food Insecurity: Not on file  Transportation Needs: Not on file  Physical Activity: Not on file  Stress: Not on file  Social Connections: Not on file  Intimate Partner Violence: Not on file    FAMILY HISTORY: Family History  Problem Relation Age of Onset   Diabetes Mother    Diabetes Maternal Grandfather    Lung cancer Paternal Grandmother    Lung cancer Paternal Grandfather    Heart disease Maternal Aunt    Diabetes Maternal Uncle     ALLERGIES:  has No Known Allergies.  MEDICATIONS:  Current Outpatient Medications  Medication Sig Dispense Refill   RIVAROXABAN (XARELTO) VTE STARTER PACK (15 & 20 MG) Follow package directions: Take one 15mg  tablet by mouth twice a day. On day 22, switch to one 20mg  tablet once a day. Take with food. 51 each 0   No current facility-administered medications for this visit.    REVIEW OF SYSTEMS:   Constitutional: Denies fevers, chills or abnormal night sweats Eyes: Denies blurriness of vision,  double vision or watery eyes Ears, nose, mouth, throat, and face: Denies mucositis or sore throat Respiratory: Denies cough, dyspnea or wheezes Cardiovascular: Denies palpitation, chest discomfort or lower extremity swelling Gastrointestinal:  Denies nausea, heartburn or change in bowel habits Skin: Denies abnormal skin rashes Lymphatics: Denies new lymphadenopathy or easy bruising Neurological:Denies numbness, tingling or new weaknesses Behavioral/Psych: Mood is stable, no new changes  All other systems were reviewed with the patient and are negative.  PHYSICAL EXAMINATION: ECOG PERFORMANCE STATUS: 1 - Symptomatic but completely ambulatory  Vitals:   06/02/21 0910  BP: (!) 147/78  Pulse: 73  Resp: 18  Temp: 97.6 F  (36.4 C)  SpO2: 98%   Filed Weights   06/02/21 0910  Weight: (!) 303 lb (137.4 kg)    GENERAL:alert, no distress and comfortable SKIN: skin color, texture, turgor are normal, no rashes or significant lesions EYES: normal, conjunctiva are pink and non-injected, sclera clear OROPHARYNX:no exudate, no erythema and lips, buccal mucosa, and tongue normal  NECK: supple, thyroid normal size, non-tender, without nodularity LYMPH:  no palpable lymphadenopathy in the cervical, axillary or inguinal LUNGS: clear to auscultation and percussion with normal breathing effort HEART: regular rate & rhythm and no murmurs with bilateral lower extremity edema ABDOMEN:abdomen soft, non-tender and normal bowel sounds Musculoskeletal:no cyanosis of digits and no clubbing  PSYCH: alert & oriented x 3 with fluent speech NEURO: no focal motor/sensory deficits  LABORATORY DATA:  I have reviewed the data as listed Lab Results  Component Value Date   WBC 9.1 05/28/2021   HGB 16.5 05/28/2021   HCT 49.7 05/28/2021   MCV 92.6 05/28/2021   PLT 306 05/28/2021     RADIOGRAPHIC STUDIES: I have personally reviewed the radiological images as listed and agreed with the findings in the report. CT Angio Chest PE W and/or Wo Contrast  Result Date: 05/28/2021 CLINICAL DATA:  History of lymphoma, prior DVT on anticoagulant therapy, fatigue for 2 days, left leg pain and swelling EXAM: CT ANGIOGRAPHY CHEST CT ABDOMEN AND PELVIS WITH CONTRAST TECHNIQUE: Multidetector CT imaging of the chest was performed using the standard protocol during bolus administration of intravenous contrast. Multiplanar CT image reconstructions and MIPs were obtained to evaluate the vascular anatomy. Multidetector CT imaging of the abdomen and pelvis was performed using the standard protocol during bolus administration of intravenous contrast. CONTRAST:  157mL OMNIPAQUE IOHEXOL 350 MG/ML SOLN COMPARISON:  04/05/2021 FINDINGS: CTA CHEST FINDINGS  Cardiovascular: This is a technically adequate evaluation of the pulmonary vasculature. Thin synechia remain within the bilateral lower lobe pulmonary arteries, consistent with chronic sequela of previous pulmonary embolus. The large clot burden seen on prior study has near completely resolved on this exam. There are no acute pulmonary emboli identified. Heart is unremarkable without pericardial effusion. No evidence of thoracic aortic aneurysm or dissection. Mediastinum/Nodes: No enlarged mediastinal, hilar, or axillary lymph nodes. Thyroid gland, trachea, and esophagus demonstrate no significant findings. Lungs/Pleura: No acute airspace disease, effusion, or pneumothorax. Central airways are patent. Musculoskeletal: No acute or destructive bony lesions. Reconstructed images demonstrate no additional findings. Review of the MIP images confirms the above findings. CT ABDOMEN and PELVIS FINDINGS Hepatobiliary: Stable hepatic steatosis. No focal liver abnormality is seen. No gallstones, gallbladder wall thickening, or biliary dilatation. Pancreas: Unremarkable. No pancreatic ductal dilatation or surrounding inflammatory changes. Spleen: Normal in size without focal abnormality. Adrenals/Urinary Tract: Right renal cysts. No urinary tract calculi or obstruction. Adrenals and bladder are unremarkable. Stomach/Bowel: No bowel obstruction or ileus. Normal appendix right  lower quadrant. Scattered colonic diverticulosis without diverticulitis. No bowel wall thickening or inflammatory change. Vascular/Lymphatic: No significant vascular findings are present. No enlarged abdominal or pelvic lymph nodes. Reproductive: Prostate is unremarkable. Other: No free fluid or free gas. There is a fat containing umbilical hernia, with abdominal wall defect measuring proximally 3.3 cm. There is a large right inguinal fat containing hernia as well. No bowel herniation. Musculoskeletal: No acute or destructive bony lesions. Reconstructed  images demonstrate no additional findings. Review of the MIP images confirms the above findings. IMPRESSION: 1. Sequela of chronic pulmonary emboli, with thin residual synechia within the bilateral lower lobe pulmonary arteries. The significant majority of the pulmonary emboli seen previously have resolved in the interim. 2. No acute pulmonary embolus. 3. Hepatic steatosis. 4. Colonic diverticulosis without diverticulitis. 5. Fat containing umbilical and right inguinal hernias. No bowel herniation. Electronically Signed   By: Randa Ngo M.D.   On: 05/28/2021 16:25   CT ABDOMEN PELVIS W CONTRAST  Result Date: 05/28/2021 CLINICAL DATA:  History of lymphoma, prior DVT on anticoagulant therapy, fatigue for 2 days, left leg pain and swelling EXAM: CT ANGIOGRAPHY CHEST CT ABDOMEN AND PELVIS WITH CONTRAST TECHNIQUE: Multidetector CT imaging of the chest was performed using the standard protocol during bolus administration of intravenous contrast. Multiplanar CT image reconstructions and MIPs were obtained to evaluate the vascular anatomy. Multidetector CT imaging of the abdomen and pelvis was performed using the standard protocol during bolus administration of intravenous contrast. CONTRAST:  172mL OMNIPAQUE IOHEXOL 350 MG/ML SOLN COMPARISON:  04/05/2021 FINDINGS: CTA CHEST FINDINGS Cardiovascular: This is a technically adequate evaluation of the pulmonary vasculature. Thin synechia remain within the bilateral lower lobe pulmonary arteries, consistent with chronic sequela of previous pulmonary embolus. The large clot burden seen on prior study has near completely resolved on this exam. There are no acute pulmonary emboli identified. Heart is unremarkable without pericardial effusion. No evidence of thoracic aortic aneurysm or dissection. Mediastinum/Nodes: No enlarged mediastinal, hilar, or axillary lymph nodes. Thyroid gland, trachea, and esophagus demonstrate no significant findings. Lungs/Pleura: No acute  airspace disease, effusion, or pneumothorax. Central airways are patent. Musculoskeletal: No acute or destructive bony lesions. Reconstructed images demonstrate no additional findings. Review of the MIP images confirms the above findings. CT ABDOMEN and PELVIS FINDINGS Hepatobiliary: Stable hepatic steatosis. No focal liver abnormality is seen. No gallstones, gallbladder wall thickening, or biliary dilatation. Pancreas: Unremarkable. No pancreatic ductal dilatation or surrounding inflammatory changes. Spleen: Normal in size without focal abnormality. Adrenals/Urinary Tract: Right renal cysts. No urinary tract calculi or obstruction. Adrenals and bladder are unremarkable. Stomach/Bowel: No bowel obstruction or ileus. Normal appendix right lower quadrant. Scattered colonic diverticulosis without diverticulitis. No bowel wall thickening or inflammatory change. Vascular/Lymphatic: No significant vascular findings are present. No enlarged abdominal or pelvic lymph nodes. Reproductive: Prostate is unremarkable. Other: No free fluid or free gas. There is a fat containing umbilical hernia, with abdominal wall defect measuring proximally 3.3 cm. There is a large right inguinal fat containing hernia as well. No bowel herniation. Musculoskeletal: No acute or destructive bony lesions. Reconstructed images demonstrate no additional findings. Review of the MIP images confirms the above findings. IMPRESSION: 1. Sequela of chronic pulmonary emboli, with thin residual synechia within the bilateral lower lobe pulmonary arteries. The significant majority of the pulmonary emboli seen previously have resolved in the interim. 2. No acute pulmonary embolus. 3. Hepatic steatosis. 4. Colonic diverticulosis without diverticulitis. 5. Fat containing umbilical and right inguinal hernias. No bowel herniation. Electronically Signed  By: Randa Ngo M.D.   On: 05/28/2021 16:25   DG Chest Portable 1 View  Result Date: 05/03/2021 CLINICAL  DATA:  Tachycardia EXAM: PORTABLE CHEST 1 VIEW COMPARISON:  10/19/2018 FINDINGS: The heart size and mediastinal contours are within normal limits. Both lungs are clear. The visualized skeletal structures are unremarkable. IMPRESSION: No active disease. Electronically Signed   By: Fidela Salisbury M.D.   On: 05/03/2021 19:50   VAS [place] LOWER EXTREMITY VENOUS (DVT) (ONLY [facility] & [facility])  Result Date: 05/28/2021  Lower Venous DVT Study Patient Name:  James Jordan  Date of Exam:   05/28/2021 Medical Rec #: [id number]         Accession #:    [id number] Date of Birth: 29-Jul-1970         Patient Gender: M Patient Age:   79 years Exam Location:  [facility] Procedure:      VAS [place] LOWER EXTREMITY VENOUS (DVT) Referring Phys: Debbe Mounts --------------------------------------------------------------------------------  Indications: Intermittent swelling in left leg. History of acute DVT in left popliteal vein and calf veins on 04/05/21.  Risk Factors: PE on 04/05/21. Anticoagulation: Eliquis. Comparison Study: 04/05/21 - LLE venous - positive for acute DVT. Performing Technologist: Oda Cogan RDMS, RVT  Examination Guidelines: A complete evaluation includes B-mode imaging, spectral Doppler, color Doppler, and power Doppler as needed of all accessible portions of each vessel. Bilateral testing is considered an integral part of a complete examination. Limited examinations for reoccurring indications may be performed as noted. The reflux portion of the exam is performed with the patient in reverse Trendelenburg.  +-----+---------------+---------+-----------+----------+--------------+  RIGHT Compressibility Phasicity Spontaneity Properties Thrombus Aging  +-----+---------------+---------+-----------+----------+--------------+  CFV   Full            Yes       Yes                                    +-----+---------------+---------+-----------+----------+--------------+  SFJ   Full                                                              +-----+---------------+---------+-----------+----------+--------------+   +---------+---------------+---------+-----------+----------+-----------------+  LEFT      Compressibility Phasicity Spontaneity Properties Thrombus Aging     +---------+---------------+---------+-----------+----------+-----------------+  CFV       Full            Yes       Yes                                       +---------+---------------+---------+-----------+----------+-----------------+  SFJ       Full                                                                +---------+---------------+---------+-----------+----------+-----------------+  FV Prox   Full                                                                +---------+---------------+---------+-----------+----------+-----------------+  FV Mid    Full                                                                +---------+---------------+---------+-----------+----------+-----------------+  FV Distal Partial                                          Chronic            +---------+---------------+---------+-----------+----------+-----------------+  PFV       Full                                                                +---------+---------------+---------+-----------+----------+-----------------+  POP       None            No        No                     Age Indeterminate  +---------+---------------+---------+-----------+----------+-----------------+  PTV       Partial                                          Age Indeterminate  +---------+---------------+---------+-----------+----------+-----------------+  PERO      None                                             Age Indeterminate  +---------+---------------+---------+-----------+----------+-----------------+ Subacute DVT in the popliteal vein and calf veins.    Summary: RIGHT: - No evidence of common femoral vein obstruction.  LEFT: - Findings consistent with age indeterminate deep vein thrombosis  involving the left popliteal vein, left posterior tibial veins, and left peroneal veins. - Findings appear essentially unchanged compared to previous examination.  *See table(s) above for measurements and observations. Electronically signed by Monica Martinez MD on 05/28/2021 at 7:53:17 PM.    Final

## 2021-06-02 NOTE — Assessment & Plan Note (Signed)
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 

## 2021-07-15 ENCOUNTER — Ambulatory Visit: Payer: Managed Care, Other (non HMO) | Admitting: Family Medicine

## 2021-07-16 ENCOUNTER — Ambulatory Visit: Payer: Managed Care, Other (non HMO) | Admitting: Family Medicine

## 2021-07-16 ENCOUNTER — Encounter: Payer: Self-pay | Admitting: Hematology and Oncology

## 2021-07-16 VITALS — BP 134/78 | HR 89 | Temp 98.2°F | Resp 17 | Ht 76.0 in | Wt 304.6 lb

## 2021-07-16 DIAGNOSIS — I82432 Acute embolism and thrombosis of left popliteal vein: Secondary | ICD-10-CM

## 2021-07-16 DIAGNOSIS — K429 Umbilical hernia without obstruction or gangrene: Secondary | ICD-10-CM | POA: Diagnosis not present

## 2021-07-16 DIAGNOSIS — F418 Other specified anxiety disorders: Secondary | ICD-10-CM

## 2021-07-16 DIAGNOSIS — K4090 Unilateral inguinal hernia, without obstruction or gangrene, not specified as recurrent: Secondary | ICD-10-CM

## 2021-07-16 DIAGNOSIS — I272 Pulmonary hypertension, unspecified: Secondary | ICD-10-CM | POA: Diagnosis not present

## 2021-07-16 DIAGNOSIS — R7303 Prediabetes: Secondary | ICD-10-CM

## 2021-07-16 DIAGNOSIS — I2699 Other pulmonary embolism without acute cor pulmonale: Secondary | ICD-10-CM | POA: Diagnosis not present

## 2021-07-16 MED ORDER — APIXABAN 5 MG PO TABS: 5.0000 mg | ORAL_TABLET | Freq: Two times a day (BID) | ORAL | 7 refills | Status: DC

## 2021-07-16 NOTE — Progress Notes (Signed)
Subjective:  Patient ID: James Jordan, male    DOB: 1970/07/19  Age: 51 y.o. MRN: [id number]  CC:  Chief Complaint  Patient presents with   Establish Care    Pt reports has not had PCP in several years unsure last dr name, concern about blood clot previous work up done but no resolution and will need refill eliquis 5 mg     HPI James Jordan presents for   Establish care visit.  I last saw him in November for hospital follow-up.  Bilateral pulmonary embolism with left leg DVT On chronic anticoagulation with Eliquis.  See last visit.  Admitted October 23 through 26, 2022 after left calf DVT, unprovoked, initially treated with heparin, transition to Eliquis.   As unprovoked, plan for follow-up with oncology to ensure lymphoma was in remission.  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  Dr. Katy Fitch local ophthalmologist, oncologist Dr. Lanier Ensign at [place] [facility].  - appt May 8th.  Pulmonary hypertension noted on initial echo likely due to thromboembolism, slightly elevated troponin likely due to PE/RV strain.  EF preserved on echo. Has not had repeat echo.  Palpitations noted 05/03/21 - seen in ER, thought to be due to caffeine and anxiety.  Continues on Eliquis 5 mg twice daily.  Denies any bleeding diatheses/new bleeding. Transitioned to xarelto for leg paraesthesias, now back on eliquis and doing well.  Repeat CT after ER visit December 15 indicating improved PE and no signs of lymphoma. [place] 12/15: Summary:  RIGHT:  - No evidence of common femoral vein obstruction.    LEFT:  - Findings consistent with age indeterminate deep vein thrombosis  involving the left popliteal vein, left posterior tibial veins, and left  peroneal veins.  - Findings appear essentially unchanged compared to previous examination.   No CP/dyspnea.     Denies anxiety now. Doing much better.  Feels better after 2nd scan. Wearing  glasses now after eye visit  - astigmatism. Helping with reading labels at work.  Visit with hematology 06/02/21 - thought to have been dehydration and travel as cause of DVT. Repeat ultrasound - chronic DVT. Plan for anticoag for 3-6 months and adequate hydration.  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   Some difficulty with erections at times - plans to discuss at follow up visit.   Umbilical hernia: Noted at his last visit in November.  No sign of obstruction/gangrene and easily reducible.  Plan to delay eval/surgery unless symptomatic.  CT 05/28/2021 indicating fat-containing umbilical and right inguinal hernias without bowel herniation. Only sore if sitting for awhile, no acute changes or skin change.  Considering repair in Oct-Nov possibly.   Elevated creatinine Noted on previous labs.  Repeat labs last visit. Improved water intake.  Lab Results  Component Value Date   CREATININE 1.21 05/28/2021   Hyperglycemia A1c at level of prediabetes in November.  Diet/exercise approach. Lab Results  Component Value Date   HGBA1C 6.1 04/20/2021  Cut out sodas. Avoiding sweets.  Exercise: minimal - more during spring/summer.  Wt Readings from Last 3 Encounters:  07/16/21 (!) 304 lb 9.6 oz (138.2 kg)  06/02/21 (!) 303 lb (137.4 kg)  05/01/21 298 lb 6.4 oz (135.4 kg)      HM: Declines covid vaccine and flu vaccine.   History Patient Active Problem List   Diagnosis Date Noted   Bilateral lower extremity edema 06/02/2021   Bilateral pulmonary embolism (  [place]) 04/05/2021   Acute deep vein thrombosis (DVT) of popliteal vein of left lower extremity (HCC)    PAC (premature atrial contraction) 06/01/2016   Obesity, Class II, BMI 35-39.9 04/01/2016   Past Medical History:  Diagnosis Date   ACL tear - left knee 2002   Per patient, not repaired    Umbilical hernia 2703   Past Surgical History:  Procedure Laterality Date   HERNIA REPAIR  5009    Umbilical   No Known Allergies Prior to Admission medications   Medication Sig Start Date End Date Taking? Authorizing Provider  apixaban (ELIQUIS) 5 MG TABS tablet Take 5 mg by mouth daily. 06/02/21  Yes [provider]   Social History   Socioeconomic History   Marital status: Married    Spouse name: Not on file   Number of children: Not on file   Years of education: Not on file   Highest education level: Not on file  Occupational History   Occupation: warehouse  Tobacco Use   Smoking status: Never   Smokeless tobacco: Current    Types: Snuff  Substance and Sexual Activity   Alcohol use: No   Drug use: No   Sexual activity: Yes  Other Topics Concern   Not on file  Social History Narrative   Lives with his wife.   Social Determinants of Health   Financial Resource Strain: Not on file  Food Insecurity: Not on file  Transportation Needs: Not on file  Physical Activity: Not on file  Stress: Not on file  Social Connections: Not on file  Intimate Partner Violence: Not on file    Review of Systems  Per HPI>  Objective:   Vitals:   07/16/21 1507  BP: 134/78  Pulse: 89  Resp: 17  Temp: 98.2 F (36.8 C)  TempSrc: Temporal  SpO2: 98%  Weight: (!) 304 lb 9.6 oz (138.2 kg)  Height: 6\' 4"  (1.93 m)     Physical Exam Vitals reviewed.  Constitutional:      Appearance: He is well-developed.  HENT:     Head: Normocephalic and atraumatic.  Neck:     Vascular: No carotid bruit or JVD.  Cardiovascular:     Rate and Rhythm: Normal rate and regular rhythm.     Heart sounds: Normal heart sounds. No murmur heard. Pulmonary:     Effort: Pulmonary effort is normal.     Breath sounds: Normal breath sounds. No rales.  Musculoskeletal:     Right lower leg: No edema.     Left lower leg: No edema.  Skin:    General: Skin is warm and dry.  Neurological:     Mental Status: He is alert and oriented to person, place, and time.  Psychiatric:        Mood and  Affect: Mood normal.       Assessment & Plan:  IKTAN AIKMAN is a 51 y.o. male . Bilateral pulmonary embolism (HCC) - Plan: apixaban (ELIQUIS) 5 MG TABS tablet Acute deep vein thrombosis (DVT) of popliteal vein of left lower extremity (HCC) - Plan: apixaban (ELIQUIS) 5 MG TABS tablet Pulmonary hypertension ([place]) - Plan: Ambulatory referral to Cardiology  -Recent imaging in October with improvement of prior PE.  Tolerating Eliquis, denies new bleeding.  CBC noted from ER.  Status post hematology eval as above.  Some question about duration of therapy, 3 to 6 months mentioned but plan for discussion of continuation of anticoagulation at October visit with hematology.  He  plans to contact hematologist to clarify but will continue Eliquis same dose for now.  May have component of chronic DVT, not sure if repeat imaging of lower extremity needed to determine timing of cessation of anticoagulation  -Pulmonary hypertension noted on prior echo during time of PE with planned recheck.  Refer to cardiology to decide on repeat testing.  Prediabetes.   - commended on improved diet, fluids. Can recheck A1c next visit.   Umbilical hernia without obstruction and without gangrene Right inguinal hernia  -Easily reducible, symptoms.  Hernia precautions/ER precautions have previously been discussed.  Plan is to delay surgery until later in the year and would need to coordinate timing of anticoagulation hold at that time.  Situational anxiety  -Noted during ER visit, reports improvement in symptoms at this time.  RTC precautions given.  Meds ordered this encounter  Medications   apixaban (ELIQUIS) 5 MG TABS tablet    Sig: Take 1 tablet (5 mg total) by mouth 2 (two) times daily.    Dispense:  60 tablet    Refill:  7   Patient Instructions  I will refill Eliquis.  I would recommend discussing the timing of stopping the anticoagulant with your hematologist  - Dr. Alvy Bimler - as she indicated discussion  about the blood thinner to be completed at your October appointment.   Keep up the good work with watching diet, drinking plenty of fluids throughout the day.  I will refer you to cardiology to evaluate for possible repeat echocardiogram as the echocardiogram at the time of your hospitalization indicated possible pulmonary hypertension which may have been in response to the blood clot at that time.  They should be giving you a call in the next few weeks.  If any further anxiety, or return of previous symptoms please follow-up and we can discuss that further.  Follow-up in 6 weeks and we can discuss cardiology work-up at that time as well as other issue we briefly discussed at the end of the visit today.  Please let me know if there are questions sooner.  Thanks for coming in today.  Return to the clinic or go to the nearest emergency room if any of your symptoms worsen or new symptoms occur.       Signed,   Merri Ray, MD [place] 07/16/21 6:44 PM

## 2021-07-16 NOTE — Patient Instructions (Addendum)
I will refill Eliquis.  I would recommend discussing the timing of stopping the anticoagulant with your hematologist  - Dr. Alvy Bimler - as she indicated discussion about the blood thinner to be completed at your October appointment.   Keep up the good work with watching diet, drinking plenty of fluids throughout the day.  I will refer you to cardiology to evaluate for possible repeat echocardiogram as the echocardiogram at the time of your hospitalization indicated possible pulmonary hypertension which may have been in response to the blood clot at that time.  They should be giving you a call in the next few weeks.  If any further anxiety, or return of previous symptoms please follow-up and we can discuss that further.  Follow-up in 6 weeks and we can discuss cardiology work-up at that time as well as other issue we briefly discussed at the end of the visit today.  Please let me know if there are questions sooner.  Thanks for coming in today.  Return to the clinic or go to the nearest emergency room if any of your symptoms worsen or new symptoms occur.

## 2021-07-27 ENCOUNTER — Telehealth: Payer: Self-pay | Admitting: Family Medicine

## 2021-07-27 NOTE — Telephone Encounter (Signed)
Pt called in asking if Dr. Carlota Raspberry can call him in some anxiety meds. Pt uses [facility] on [address] in [place].   Pt states that he and Dr. Carlota Raspberry have talked about this at the time he declined the script but has had a few more episodes of panic attacks.  Please advise

## 2021-07-27 NOTE — Telephone Encounter (Signed)
Pt is asking for anxiety medication, states you have discussed this previously.  Please advise

## 2021-07-28 ENCOUNTER — Emergency Department (HOSPITAL_COMMUNITY)
Admission: EM | Admit: 2021-07-28 | Discharge: 2021-07-28 | Disposition: A | Discharge status: Home / Self Care | Payer: Managed Care, Other (non HMO) | Attending: Emergency Medicine | Admitting: Emergency Medicine

## 2021-07-28 ENCOUNTER — Other Ambulatory Visit: Payer: Self-pay

## 2021-07-28 ENCOUNTER — Encounter (HOSPITAL_COMMUNITY): Payer: Self-pay | Admitting: *Deleted

## 2021-07-28 DIAGNOSIS — R002 Palpitations: Secondary | ICD-10-CM | POA: Insufficient documentation

## 2021-07-28 DIAGNOSIS — Z7901 Long term (current) use of anticoagulants: Secondary | ICD-10-CM | POA: Insufficient documentation

## 2021-07-28 LAB — BASIC METABOLIC PANEL
Anion gap: 9 (ref 5–15)
BUN: 15 mg/dL (ref 6–20)
CO2: 24 mmol/L (ref 22–32)
Calcium: 9.3 mg/dL (ref 8.9–10.3)
Chloride: 107 mmol/L (ref 98–111)
Creatinine, Ser: 1.14 mg/dL (ref 0.61–1.24)
GFR, Estimated: >60 mL/min (ref >60)
Glucose, Bld: 110 mg/dL — ABNORMAL HIGH (ref 70–99)
Potassium: 4 mmol/L (ref 3.5–5.1)
Sodium: 140 mmol/L (ref 135–145)

## 2021-07-28 LAB — TROPONIN I (HIGH SENSITIVITY)
Troponin I (High Sensitivity): 7 ng/L (ref <18)
Troponin I (High Sensitivity): 5 ng/L (ref <18)

## 2021-07-28 LAB — CBC
HCT: 49 % (ref 39.0–52.0)
Hemoglobin: 16.5 g/dL (ref 13.0–17.0)
MCH: 30.6 pg (ref 26.0–34.0)
MCHC: 33.7 g/dL (ref 30.0–36.0)
MCV: 90.9 fL (ref 80.0–100.0)
Platelets: 298 10*3/uL (ref 150–400)
RBC: 5.39 MIL/uL (ref 4.22–5.81)
RDW: 13.3 % (ref 11.5–15.5)
WBC: 7.4 10*3/uL (ref 4.0–10.5)
nRBC: 0 % (ref 0.0–0.2)

## 2021-07-28 LAB — MAGNESIUM: Magnesium: 2 mg/dL (ref 1.7–2.4)

## 2021-07-28 LAB — TSH: TSH: 2.085 u[IU]/mL (ref 0.350–4.500)

## 2021-07-28 NOTE — ED Triage Notes (Addendum)
States that he feels like his heart is beating rapidly, lasted about 10 minutes this morning. Pt states he has had several of these episodes recently and has been evaluated for the same. On eliquis for PE and DVT. Denies current sp or sob.

## 2021-07-28 NOTE — ED Provider Notes (Signed)
[facility] Emergency Department Provider Note MRN:  [id number]  Arrival date & time: 07/28/21     Chief Complaint   Palpitations   History of Present Illness   James Jordan is a 51 y.o. year-old male presents to the ED with chief complaint of palpitations.  He states that he has had this in the past as well.  Reports fairly recent diagnosis of PE and DVT.  He is anticoagulated on Eliquis.  He has been compliant with his medications.  He reports having had a repeat CT PE study which showed clearing of his PE.  He states that the DVT persists.  He denies chest pain or shortness of breath.  He states that the palpitations lasted for approximately 10 minutes.  He states that he has been seen here for the same, and it was suggested that he may have anxiety due to his recent PE diagnosis.  He saw his primary care doctor, who he says offered him anxiety medication, but patient declined.  Now patient states that he thinks that he needs some anxiety medication.  He reports having 1 cup of coffee daily, but not really having any other caffeine or stimulants.    Review of Systems  Pertinent review of systems noted in HPI.    Physical Exam   Vitals:   07/28/21 0945 07/28/21 1000  BP: 125/72 125/70  Pulse: (!) 55 (!) 58  Resp: 13 13  Temp:    SpO2: 98% 97%    CONSTITUTIONAL:  well-appearing, NAD NEURO:  Alert and oriented x 3, CN 3-12 grossly intact EYES:  eyes equal and reactive ENT/NECK:  Supple, no stridor  CARDIO:  normal rate, regular rhythm, appears well-perfused  PULM:  No respiratory distress,  GI/GU:  non-distended,  MSK/SPINE:  No gross deformities, no edema, moves all extremities  SKIN:  no rash, atraumatic   *Additional and/or pertinent findings included in MDM below  Diagnostic and Interventional Summary    EKG Interpretation  Date/Time:  Tuesday July 28 2021 06:18:34 EST Ventricular Rate:  74 PR Interval:  136 QRS Duration: 88 QT  Interval:  396 QTC Calculation: 439 R Axis:   65 Text Interpretation: Sinus rhythm with Premature atrial complexes Otherwise normal ECG When compared with ECG of 03-May-2021 18:30, Confirmed by Madalyn Rob [id number]) on 07/28/2021 10:16:16 AM       Labs Reviewed  BASIC METABOLIC PANEL - Abnormal; Notable for the following components:      Result Value   Glucose, Bld 110 (*)    All other components within normal limits  CBC  MAGNESIUM  TSH  TROPONIN I (HIGH SENSITIVITY)  TROPONIN I (HIGH SENSITIVITY)    No orders to display    Medications - No data to display   Procedures  /  Critical Care Procedures  ED Course and Medical Decision Making  I have reviewed the triage vital signs, the nursing notes, and pertinent available records from the EMR.  Complexity of Problems Addressed Acute illness or injury that poses threat of life of bodily function  Additional Data Reviewed and Analyzed Further history obtained from: Past medical history and medications listed in the EMR, Prior ED visit notes, and Prior labs/imaging results    ED Course  I considered potential for return of PE, but patient reports compliance with his meds and had a scan that showed resolution of PE.  Considered other tachyarrhythmias such as SVT and A-fib, but EKG showed no evidence of this.  Anxiety is  also considered.  I have personally reviewed patient's EKG, which shows PACs, but no sign of serious arrhythmia.  Laboratory work-up is reviewed, no significant electrolyte derangement, normal magnesium and TSH.  Will refer to primary care and/or cardiology for consideration of outpatient heart monitor and/or treatment for anxiety.  Return precautions discussed.     Final Clinical Impressions(s) / ED Diagnoses     ICD-10-CM   1. Palpitations  R00.2       ED Discharge Orders     None        Discharge Instructions Discussed with and Provided to Patient:     Discharge Instructions       I recommend that you ask your primary care doctor about a heart monitor.  This could be used to identify any abnormal heart rhythms or palpitation events that we have been unsuccessful in capturing in the emergency department.  If primary care is unable to help you with this, you could contact the cardiology office listed.       Montine Circle, PA-C 07/28/21 1017    Lucrezia Starch, MD 07/30/21 1538

## 2021-07-28 NOTE — Telephone Encounter (Signed)
Noted.  When we last talked his anxiety has resolved but did discuss follow-up if symptoms return.  Please schedule a virtual visit so we can have an opportunity to discuss symptoms and appropriate medication further.  Thanks

## 2021-07-28 NOTE — Discharge Instructions (Signed)
I recommend that you ask your primary care doctor about a heart monitor.  This could be used to identify any abnormal heart rhythms or palpitation events that we have been unsuccessful in capturing in the emergency department.  If primary care is unable to help you with this, you could contact the cardiology office listed.

## 2021-07-29 NOTE — Telephone Encounter (Signed)
LM for pt to call back to schedule an appt.  °

## 2021-07-30 ENCOUNTER — Other Ambulatory Visit: Payer: Self-pay

## 2021-07-30 ENCOUNTER — Ambulatory Visit (INDEPENDENT_AMBULATORY_CARE_PROVIDER_SITE_OTHER): Payer: Managed Care, Other (non HMO)

## 2021-07-30 ENCOUNTER — Encounter: Payer: Self-pay | Admitting: Family Medicine

## 2021-07-30 ENCOUNTER — Other Ambulatory Visit: Payer: Self-pay | Admitting: *Deleted

## 2021-07-30 ENCOUNTER — Telehealth (INDEPENDENT_AMBULATORY_CARE_PROVIDER_SITE_OTHER): Payer: Managed Care, Other (non HMO) | Admitting: Family Medicine

## 2021-07-30 DIAGNOSIS — I491 Atrial premature depolarization: Secondary | ICD-10-CM | POA: Diagnosis not present

## 2021-07-30 DIAGNOSIS — R002 Palpitations: Secondary | ICD-10-CM | POA: Diagnosis not present

## 2021-07-30 NOTE — Progress Notes (Unsigned)
Enrolled for [employer] to mail a ZIO XT long term holter monitor to the patients address on file.  

## 2021-07-30 NOTE — Progress Notes (Signed)
Virtual Visit via audio Note  I connected with [place] on 07/30/21 at 1:17 PM by audio - multiple video attempts, unable to maintain connection, and verified that I am speaking with the correct person using two identifiers.  Patient location: in car at work. By self. My location: office [place]    I discussed the limitations, risks, security and privacy concerns of performing an evaluation and management service by telephone and the availability of in person appointments. I also discussed with the patient that there may be a patient responsible charge related to this service. The patient expressed understanding and agreed to proceed, consent obtained  Chief complaint: Chief Complaint  Patient presents with   Med Change Request    Patient states he is needing a visit for anxiety medication that was discussed. GAD7=2    History of Present Illness: James Jordan is a 51 y.o. male  Anxiety Discussed briefly his last visit.  Some anxiety when he was in the ER previously, had some palpitations in November 2022.  Palpitations were thought to be due to caffeine and anxiety symptoms.  He had an another ER visit December 15 and CT indicated improved PE and no signs of lymphoma.  Anxiety symptoms were improving when he saw me February 2, denied anxiety at that time and was feeling much better after his second CT scan.  Discussed follow-up if anxiety symptoms recurred.   Anxiety had improved, but over the weekend noted his heart was racing - not feeling stressed or anxious initially until heart started racing. No chest pain/palpitations, no dyspnea, no near syncope. felt nervous. Improved on own after 54mins after trying deep breaths. Second time occurred 2 days ago. Lasted 10 minutes, then resolved. Getting ready for work, felt heart racing again. May have been worried about heart at that time as well. Seen in ER  -  Troponin neg x 2, EKG with SR with PAC. BMP with glucose 110, CBC, tsh,  magnesium ok. Not feeling symptoms when he was in ER.  Cardiology appt 3/2.  No flairs in past 2 days.  Some worry about PE, but no other stressors.  No recent chest pain or dyspnea.  Minimal caffeine - 1/2 cup coffee in am.     Depression screen [facility] 2/9 07/30/2021 07/16/2021 05/01/2021 04/20/2021 04/01/2016  Decreased Interest 0 0 0 0 0  Down, Depressed, Hopeless 0 0 0 0 0  PHQ - 2 Score 0 0 0 0 0  Altered sleeping 0 0 - - -  Tired, decreased energy 0 0 - - -  Change in appetite 0 0 - - -  Feeling bad or failure about yourself  0 0 - - -  Trouble concentrating 0 0 - - -  Moving slowly or fidgety/restless 0 0 - - -  Suicidal thoughts 0 0 - - -  PHQ-9 Score 0 0 - - -  Difficult doing work/chores Not difficult at all - - - -   GAD 7 : Generalized Anxiety Score 07/30/2021  Nervous, Anxious, on Edge 1  Control/stop worrying 0  Worry too much - different things 0  Trouble relaxing 1  Restless 0  Easily annoyed or irritable 0  Afraid - awful might happen 0  Total GAD 7 Score 2  Anxiety Difficulty Not difficult at all      Patient Active Problem List   Diagnosis Date Noted   Bilateral lower extremity edema 06/02/2021   Bilateral pulmonary embolism ([place]) 04/05/2021   Acute  deep vein thrombosis (DVT) of popliteal vein of left lower extremity (HCC)    PAC (premature atrial contraction) 06/01/2016   Obesity, Class II, BMI 35-39.9 04/01/2016   Past Medical History:  Diagnosis Date   ACL tear - left knee 2002   Per patient, not repaired    Umbilical hernia 2505   Past Surgical History:  Procedure Laterality Date   HERNIA REPAIR  3976   Umbilical   No Known Allergies Prior to Admission medications   Medication Sig Start Date End Date Taking? Authorizing Provider  apixaban (ELIQUIS) 5 MG TABS tablet Take 1 tablet (5 mg total) by mouth 2 (two) times daily. 07/16/21  Yes Wendie Agreste, MD   Social History   Socioeconomic History   Marital status: Married    Spouse name:  Not on file   Number of children: Not on file   Years of education: Not on file   Highest education level: Not on file  Occupational History   Occupation: warehouse  Tobacco Use   Smoking status: Never   Smokeless tobacco: Current    Types: Snuff  Substance and Sexual Activity   Alcohol use: No   Drug use: No   Sexual activity: Yes  Other Topics Concern   Not on file  Social History Narrative   Lives with his wife.   Social Determinants of Health   Financial Resource Strain: Not on file  Food Insecurity: Not on file  Transportation Needs: Not on file  Physical Activity: Not on file  Stress: Not on file  Social Connections: Not on file  Intimate Partner Violence: Not on file    Observations/Objective:There were no vitals filed for this visit.  Speaking in full sentences, euthymic mood, calm, appropriate and coherent responses.  No distress.  No respiratory distress.  All questions were answered with understanding expressed.  Assessment and Plan: Palpitations  PAC (premature atrial contraction) 2 episodes in the past week.  Both lasting approximately 10 minutes.  No associated chest pain, dyspnea, near syncope and self resolved.  Denies preceding anxiety, and only feeling anxious during the episodes.  Less likely primary anxiety cause.  PACs noted on EKG in ER but unfortunately was not having symptoms at the time of ER monitoring.  Less likely atrial fibrillation, but he is already on anticoagulation with history of PE.  Likely will need outpatient cardiac monitoring, message sent to cardiologist who will be seeing him on March 2 to see if that needs to be ordered prior to that visit or wait until seen in office.  Handout given on palpitations.  Declined medications for anxiety at this time, which I think is reasonable.  ER precautions given.  Follow Up Instructions: With cardiology as planned March 2, ER precautions sooner if needed.   I discussed the assessment and  treatment plan with the patient. The patient was provided an opportunity to ask questions and all were answered. The patient agreed with the plan and demonstrated an understanding of the instructions.   The patient was advised to call back or seek an in-person evaluation if the symptoms worsen or if the condition fails to improve as anticipated.  I provided 22 minutes of non-face-to-face time during this encounter.   Wendie Agreste, MD

## 2021-07-30 NOTE — Patient Instructions (Addendum)
Based on your symptoms, it appears that primarily palpitations are the main cause with some secondary anxiety as would be expected when you are feeling the symptoms.  I am encouraged by the ER work-up and the fact that you do not have other associated symptoms with the fast heart rate.  As we discussed you are taking a blood thinner already, so for some fast heart rate conditions that should be helpful.  Further testing will likely be completed by cardiology and I did send a message to see if any of that needs to be ordered prior to your scheduled visit.  Let me know if there are questions.  Return to the clinic or go to the nearest emergency room if any of your symptoms worsen or new symptoms occur.   Palpitations Palpitations are feelings that your heartbeat is irregular or is faster than normal. It may feel like your heart is fluttering or skipping a beat. Palpitations may be caused by many things, including smoking, caffeine, alcohol, stress, and certain medicines or drugs. Most causes of palpitations are not serious.  However, some palpitations can be a sign of a serious problem. Further tests and a thorough medical history will be done to find the cause of your palpitations. Your provider may order tests such as an ECG, labs, an echocardiogram, or an ambulatory continuous ECG monitor. Follow these instructions at home: Pay attention to any changes in your symptoms. Let your health care provider know about them. Take these actions to help manage your symptoms: Eating and drinking Follow instructions from your health care provider about eating or drinking restrictions. You may need to avoid foods and drinks that may cause palpitations. These may include: Caffeinated coffee, tea, soft drinks, and energy drinks. Chocolate. Alcohol. Diet pills. Lifestyle   Take steps to reduce your stress and anxiety. Things that can help you relax include: Yoga. Mind-body activities, such as deep breathing,  meditation, or using words and images to create positive thoughts (guided imagery). Physical activity, such as swimming, jogging, or walking. Tell your health care provider if your palpitations increase with activity. If you have chest pain or shortness of breath with activity, do not continue the activity until you are seen by your health care provider. Biofeedback. This is a method that helps you learn to use your mind to control things in your body, such as your heartbeat. Get plenty of rest and sleep. Keep a regular bed time. Do not use drugs, including cocaine or ecstasy. Do not use marijuana. Do not use any products that contain nicotine or tobacco. These products include cigarettes, chewing tobacco, and vaping devices, such as e-cigarettes. If you need help quitting, ask your health care provider. General instructions Take over-the-counter and prescription medicines only as told by your health care provider. Keep all follow-up visits. This is important. These may include visits for further testing if palpitations do not go away or get worse. Contact a health care provider if: You continue to have a fast or irregular heartbeat for a long period of time. You notice that your palpitations occur more often. Get help right away if: You have chest pain or shortness of breath. You have a severe headache. You feel dizzy or you faint. These symptoms may represent a serious problem that is an emergency. Do not wait to see if the symptoms will go away. Get medical help right away. Call your local emergency services (911 in the [place] Do not drive yourself to the hospital. Summary Palpitations are  feelings that your heartbeat is irregular or is faster than normal. It may feel like your heart is fluttering or skipping a beat. Palpitations may be caused by many things, including smoking, caffeine, alcohol, stress, certain medicines, and drugs. Further tests and a thorough medical history may be done to  find the cause of your palpitations. Get help right away if you faint or have chest pain, shortness of breath, severe headache, or dizziness. This information is not intended to replace advice given to you by your health care provider. Make sure you discuss any questions you have with your health care provider. Document Revised: 10/22/2020 Document Reviewed: 10/22/2020 Elsevier Patient Education  [place].

## 2021-07-30 NOTE — Addendum Note (Signed)
Addended by: Merri Ray R on: 07/30/2021 03:33 PM   Modules accepted: Orders

## 2021-08-02 ENCOUNTER — Encounter: Payer: Self-pay | Admitting: Interventional Cardiology

## 2021-08-02 DIAGNOSIS — R002 Palpitations: Secondary | ICD-10-CM

## 2021-08-02 DIAGNOSIS — I491 Atrial premature depolarization: Secondary | ICD-10-CM

## 2021-08-13 ENCOUNTER — Encounter: Payer: Self-pay | Admitting: Interventional Cardiology

## 2021-08-13 ENCOUNTER — Ambulatory Visit: Payer: Managed Care, Other (non HMO) | Admitting: Interventional Cardiology

## 2021-08-13 ENCOUNTER — Other Ambulatory Visit: Payer: Self-pay

## 2021-08-13 VITALS — BP 126/84 | HR 71 | Ht 76.0 in | Wt 301.0 lb

## 2021-08-13 DIAGNOSIS — I272 Pulmonary hypertension, unspecified: Secondary | ICD-10-CM

## 2021-08-13 DIAGNOSIS — R002 Palpitations: Secondary | ICD-10-CM

## 2021-08-13 DIAGNOSIS — I2699 Other pulmonary embolism without acute cor pulmonale: Secondary | ICD-10-CM

## 2021-08-13 DIAGNOSIS — I491 Atrial premature depolarization: Secondary | ICD-10-CM | POA: Diagnosis not present

## 2021-08-13 DIAGNOSIS — E669 Obesity, unspecified: Secondary | ICD-10-CM

## 2021-08-13 NOTE — Patient Instructions (Signed)
Medication Instructions:  ?Your physician recommends that you continue on your current medications as directed. Please refer to the Current Medication list given to you today. ? ?*If you need a refill on your cardiac medications before your next appointment, please call your pharmacy* ? ? ?Lab Work: ?none ?If you have labs (blood work) drawn today and your tests are completely normal, you will receive your results only by: ?MyChart Message (if you have MyChart) OR ?A paper copy in the mail ?If you have any lab test that is abnormal or we need to change your treatment, we will call you to review the results. ? ? ?Testing/Procedures: ?Your physician has requested that you have an echocardiogram. Echocardiography is a painless test that uses sound waves to create images of your heart. It provides your doctor with information about the size and shape of your heart and how well your heart?s chambers and valves are working. This procedure takes approximately one hour. There are no restrictions for this procedure. ? ? ? ?Follow-Up: ?At [facility], you and your health needs are our priority.  As part of our continuing mission to provide you with exceptional heart care, we have created designated Provider Care Teams.  These Care Teams include your primary Cardiologist (physician) and Advanced Practice Providers (APPs -  Physician Assistants and Nurse Practitioners) who all work together to provide you with the care you need, when you need it. ? ?We recommend signing up for the patient portal called "MyChart".  Sign up information is provided on this After Visit Summary.  MyChart is used to connect with patients for Virtual Visits (Telemedicine).  Patients are able to view lab/test results, encounter notes, upcoming appointments, etc.  Non-urgent messages can be sent to your provider as well.   ?To learn more about what you can do with MyChart, go to [email or web address].   ? ?Your next appointment:   ?As needed ? ?The  format for your next appointment:   ?In Person ? ?Provider:   ?Larae Grooms, MD   ? ? ?Other Instructions ?  ? ?

## 2021-08-13 NOTE — Progress Notes (Signed)
?  ?Cardiology Office Note ? ? ?Date:  08/13/2021  ? ?ID:  James Jordan, DOB 02/03/1971, MRN [id number] ? ?PCP:  James Agreste, MD  ? ? ?Chief Complaint  ?Patient presents with  ? Establish Care  ? ?Palpitations ? ?Wt Readings from Last 3 Encounters:  ?08/13/21 (!) 301 lb (136.5 kg)  ?07/16/21 (!) 304 lb 9.6 oz (138.2 kg)  ?06/02/21 (!) 303 lb (137.4 kg)  ?  ? ?  ?History of Present Illness: ?James Jordan is a 51 y.o. male who is being seen today for the evaluation of palpitations at the request of James Agreste, MD.  ? ?Records from James Jordan show: "Some anxiety when he was in the ER previously, had some palpitations in November 2022.  Palpitations were thought to be due to caffeine and anxiety symptoms.  He had an another ER visit December 15 and CT indicated improved PE and no signs of lymphoma.  Anxiety symptoms were improving when he saw me February 2, denied anxiety at that time and was feeling much better after his second CT scan.  Discussed follow-up if anxiety symptoms recurred.  ?  ?Anxiety had improved, but over the weekend (Feb 2023) noted his heart was racing - not feeling stressed or anxious initially until heart started racing. No chest pain/palpitations, no dyspnea, no near syncope. felt nervous. Improved on own after 49mins after trying deep breaths. ?Second time occurred 2 days ago. Lasted 10 minutes, then resolved. Getting ready for work, felt heart racing again. May have been worried about heart at that time as well. Seen in ER  -  ?Troponin neg x 2, EKG with SR with PAC. BMP with glucose 110, CBC, tsh, magnesium ok. Not feeling symptoms when he was in ER.  ?Cardiology appt 3/2.  ?No flairs in past 2 days.  ?Some worry about PE, but no other stressors.  ?No recent chest pain or dyspnea.  ?Minimal caffeine - 1/2 cup coffee in am." ? ?Echo in the setting of PE in 03/2021 showed: ?"Left ventricular ejection fraction, by estimation, is 55 to 60%. The  ?left ventricle has normal  function. The left ventricle has no regional  ?wall motion abnormalities. Left ventricular diastolic parameters were  ?normal.  ? 2. Right ventricular systolic function is normal. The right ventricular  ?size is normal. There is severely elevated pulmonary artery systolic  ?pressure. The estimated right ventricular systolic pressure is 41.9 mmHg.  ? 3. Right atrial size was mildly dilated.  ? 4. The mitral valve is normal in structure. Trivial mitral valve  ?regurgitation. No evidence of mitral stenosis.  ? 5. The aortic valve is grossly normal. Aortic valve regurgitation is not  ?visualized. No aortic stenosis is present.  ? 6. The inferior vena cava is dilated in size with >50% respiratory  ?variability, suggesting right atrial pressure of 8 mmHg. " ? ?Works in a [employer].   ? ?Wearing monitor.  No palpitations in the first 12 days of the monitor. Wearing compression stockings and drinking a lot of water.  ? ?Past Medical History:  ?Diagnosis Date  ? ACL tear - left knee 2002  ? Per patient, not repaired   ? Umbilical hernia 6222  ? ? ?Past Surgical History:  ?Procedure Laterality Date  ? HERNIA REPAIR  2004  ? Umbilical  ? ? ? ?Current Outpatient Medications  ?Medication Sig Dispense Refill  ? apixaban (ELIQUIS) 5 MG TABS tablet Take 1 tablet (5 mg total) by mouth 2 (two)  times daily. 60 tablet 7  ? ?No current facility-administered medications for this visit.  ? ? ?Allergies:   Patient has no known allergies.  ? ? ?Social History:  The patient  reports that he has never smoked. He has never been exposed to tobacco smoke. His smokeless tobacco use includes snuff. He reports that he does not drink alcohol and does not use drugs.  ? ?Family History:  The patient's family history includes Diabetes in his maternal grandfather, maternal uncle, and mother; Heart disease in his maternal aunt; Lung cancer in his paternal grandfather and paternal grandmother.  ? ? ?ROS:  Please see the history of present illness.    Otherwise, review of systems are positive for improved leg swelling.   All other systems are reviewed and negative.  ? ? ?PHYSICAL EXAM: ?VS:  BP 126/84   Pulse 71   Ht 6\' 4"  (1.93 m)   Wt (!) 301 lb (136.5 kg)   SpO2 97%   BMI 36.64 kg/m?  , BMI Body mass index is 36.64 kg/m?. ?GEN: Well nourished, well developed, in no acute distress ?HEENT: normal ?Neck: no JVD, carotid bruits, or masses ?Cardiac: RRR; no murmurs, rubs, or gallops, trace lower extremity edema  ?Respiratory:  clear to auscultation bilaterally, normal work of breathing ?GI: soft, nontender, nondistended, + BS, obese ?MS: no deformity or atrophy ?Skin: warm and dry, no rash ?Neuro:  Strength and sensation are intact ?Psych: euthymic mood, full affect ? ? ?EKG:   ?The ekg ordered 2/14 demonstrates normal sinus rhythm, PAC ? ? ?Recent Labs: ?04/05/2021: B Natriuretic Peptide 34.5 ?05/03/2021: ALT 20 ?07/28/2021: BUN 15; Creatinine, Ser 1.14; Hemoglobin 16.5; Magnesium 2.0; Platelets 298; Potassium 4.0; Sodium 140; TSH 2.085  ? ?Lipid Panel ?   ?Component Value Date/Time  ? CHOL 161 04/01/2016 1246  ? TRIG 72 04/01/2016 1246  ? HDL 52 04/01/2016 1246  ? CHOLHDL 3.1 04/01/2016 1246  ? VLDL 14 04/01/2016 1246  ? Crum 95 04/01/2016 1246  ? ?  ?Other studies Reviewed: ?Additional studies/ records that were reviewed today with results demonstrating: A1c 6.1, creatinine 1.1. ? ? ?ASSESSMENT AND PLAN: ? ?PE: On Eliquis for 1 year and then to be reevaluated by oncology. Lungs improved.  Pulmonary hypertension was noted on the echo from October 2022.  I suspect pulmonary pressures will have improved with improvement in his PE.  We will repeat echocardiogram to evaluate PA pressure. ?Palpitations: PACs noted in the past.  Wearing monitor.  No symptoms in the first 12 days of his monitor.  If he has a negative monitor, would consider implantable loop monitor.  Reassuring that he is feeling better.  Hopefully, palpitations will improve with reduced anxiety  as well.  He feels like if his echo shows improvement, that we will take a big weight off his mind. ?Obesity: Continue to stay active.  Healthy diet will be beneficial as well.  He should have fasting lipids at some point as part of routine screening. ? ? ?Current medicines are reviewed at length with the patient today.  The patient concerns regarding his medicines were addressed. ? ?The following changes have been made:  No change ? ?Labs/ tests ordered today include:  ?No orders of the defined types were placed in this encounter. ? ? ?Recommend 150 minutes/week of aerobic exercise ?Low fat, low carb, high fiber diet recommended ? ?Disposition:   FU as needed ? ? ?Signed, ?Larae Grooms, MD  ?08/13/2021 10:35 AM    ?[facility] ?[address] [place], [place]  [address] ?Phone: [phone]; Fax: [phone]  ? ?

## 2021-08-26 ENCOUNTER — Ambulatory Visit (HOSPITAL_COMMUNITY): Payer: Managed Care, Other (non HMO) | Attending: Cardiovascular Disease

## 2021-08-26 ENCOUNTER — Other Ambulatory Visit: Payer: Self-pay

## 2021-08-26 DIAGNOSIS — Z86718 Personal history of other venous thrombosis and embolism: Secondary | ICD-10-CM | POA: Insufficient documentation

## 2021-08-26 DIAGNOSIS — Z86711 Personal history of pulmonary embolism: Secondary | ICD-10-CM | POA: Insufficient documentation

## 2021-08-26 DIAGNOSIS — I491 Atrial premature depolarization: Secondary | ICD-10-CM | POA: Diagnosis present

## 2021-08-26 DIAGNOSIS — E669 Obesity, unspecified: Secondary | ICD-10-CM | POA: Diagnosis not present

## 2021-08-26 DIAGNOSIS — R002 Palpitations: Secondary | ICD-10-CM | POA: Diagnosis present

## 2021-08-26 DIAGNOSIS — R609 Edema, unspecified: Secondary | ICD-10-CM | POA: Diagnosis not present

## 2021-08-26 DIAGNOSIS — R600 Localized edema: Secondary | ICD-10-CM | POA: Diagnosis not present

## 2021-08-26 LAB — ECHOCARDIOGRAM COMPLETE
Area-P 1/2: 3.53 cm2
S' Lateral: 3.5 cm

## 2021-08-27 ENCOUNTER — Ambulatory Visit: Payer: Managed Care, Other (non HMO) | Admitting: Family Medicine

## 2021-08-27 ENCOUNTER — Encounter: Payer: Self-pay | Admitting: Family Medicine

## 2021-08-27 VITALS — BP 130/76 | HR 78 | Temp 97.9°F | Resp 17 | Ht 76.0 in | Wt 305.4 lb

## 2021-08-27 DIAGNOSIS — R002 Palpitations: Secondary | ICD-10-CM | POA: Diagnosis not present

## 2021-08-27 DIAGNOSIS — I491 Atrial premature depolarization: Secondary | ICD-10-CM

## 2021-08-27 NOTE — Progress Notes (Signed)
? ?Subjective:  ?Patient ID: James Jordan, male    DOB: [birth year]  Age: 51 y.o. MRN: [id number] ? ?CC:  ?Chief Complaint  ?Patient presents with  ? Referral  ?  Pt here to discuss last visit, notes saw cardiology yesterday and got results this morning without issues, has been doing well   ? ? ?HPI ?James Jordan presents for  ? ?Premature atrial contractions ?Discussed 1 month ago.  2 episodes in the prior week.  Less likely primary anxiety cause.  He did have some PACs noted on his EKG in the ER but was not having same symptoms at the time of ER monitoring.  Differential of A-fib but he was already on anticoagulation with history of PE.  Outpatient monitoring ordered, and follow-up with cardiology occurred yesterday with Dr. Irish Lack.  Note reviewed. ? ?No palpitations in the first 12 days use of the monitor.  Plan for repeat echo to evaluate PA pressure as pulmonary hypertension on echo in October may have been associated with PE and expected improvement. ?Plan for continued monitoring of Zio patch/monitor.  If negative monitor then consider implantable loop monitor.  No specific limitations. As needed follow up.  ?Echo yesterday - waiting on report from cardiology ?Feels well, no palpitations since last visit. Activity with work - no CP/dyspnea.  ?Zio patch completed 3/5. 14 days used.  Max heart rate 158, minimum 45, average 73.  Predominant rhythm was sinus rhythm.  For SVT runs occurred, fastest interval lasting 13 beats with a max rate of 158.  Isolated SVE's were occasional, 2.7%. ? ? ? ? ?History ?Patient Active Problem List  ? Diagnosis Date Noted  ? Bilateral lower extremity edema 06/02/2021  ? Bilateral pulmonary embolism ([place]) 04/05/2021  ? Acute deep vein thrombosis (DVT) of popliteal vein of left lower extremity (HCC)   ? PAC (premature atrial contraction) 06/01/2016  ? Obesity, Class II, BMI 35-39.9 04/01/2016  ? ?Past Medical History:  ?Diagnosis Date  ? ACL tear - left knee 2002  ? Per  patient, not repaired   ? Umbilical hernia 2263  ? ?Past Surgical History:  ?Procedure Laterality Date  ? HERNIA REPAIR  2004  ? Umbilical  ? ?No Known Allergies ?Prior to Admission medications   ?Medication Sig Start Date End Date Taking? Authorizing Provider  ?apixaban (ELIQUIS) 5 MG TABS tablet Take 1 tablet (5 mg total) by mouth 2 (two) times daily. 07/16/21   Wendie Agreste, MD  ? ?Social History  ? ?Socioeconomic History  ? Marital status: Married  ?  Spouse name: Not on file  ? Number of children: Not on file  ? Years of education: Not on file  ? Highest education level: Not on file  ?Occupational History  ? Occupation: warehouse  ?Tobacco Use  ? Smoking status: Never  ?  Passive exposure: Never  ? Smokeless tobacco: Current  ?  Types: Snuff  ?Substance and Sexual Activity  ? Alcohol use: No  ? Drug use: No  ? Sexual activity: Yes  ?Other Topics Concern  ? Not on file  ?Social History Narrative  ? Lives with his wife.  ? ?Social Determinants of Health  ? ?Financial Resource Strain: Not on file  ?Food Insecurity: Not on file  ?Transportation Needs: Not on file  ?Physical Activity: Not on file  ?Stress: Not on file  ?Social Connections: Not on file  ?Intimate Partner Violence: Not on file  ? ? ?Review of Systems ? ?Per HPI.  ?Objective:  ? ?  Vitals:  ? 08/27/21 1215  ?BP: 130/76  ?Pulse: 78  ?Resp: 17  ?Temp: 97.9 ?F (36.6 ?C)  ?TempSrc: Temporal  ?SpO2: 97%  ?Weight: (!) 305 lb 6.4 oz (138.5 kg)  ?Height: '6\' 4"'$  (1.93 m)  ? ? ? ?Physical Exam ?Vitals reviewed.  ?Constitutional:   ?   Appearance: He is well-developed.  ?HENT:  ?   Head: Normocephalic and atraumatic.  ?Neck:  ?   Vascular: No carotid bruit or JVD.  ?Cardiovascular:  ?   Rate and Rhythm: Normal rate and regular rhythm.  ?   Heart sounds: Normal heart sounds. No murmur heard. ?Pulmonary:  ?   Effort: Pulmonary effort is normal.  ?   Breath sounds: Normal breath sounds. No rales.  ?Musculoskeletal:  ?   Right lower leg: No edema.  ?   Left lower  leg: No edema.  ?Skin: ?   General: Skin is warm and dry.  ?Neurological:  ?   Mental Status: He is alert and oriented to person, place, and time.  ?Psychiatric:     ?   Mood and Affect: Mood normal.  ? ? ? ? ? ?Assessment & Plan:  ?James Jordan is a 51 y.o. male . ?PAC (premature atrial contraction) ? ?Palpitations ? ?Palpitations resolved since last visit.  Feeling well.  Zio patch report, echo report reviewed which overall reassuring.  We will wait for discussion, recommendations from cardiology.  Can also decide if implantable loop monitor needed.  Recheck 3 months.  RTC precautions. ? ?No orders of the defined types were placed in this encounter. ? ?Patient Instructions  ?Based on my review of your echo and monitor results, overall things look to be okay.  I would wait to hear specifics from your cardiologist, but if you have not heard from him in the next week or 2 I recommending calling his office.  Can also discuss whether or not a loop monitor is needed at this point based on the results from the [facility] patch.  Follow-up with me in 3 months but let me know if there are concerns prior to that time.  Take care.  ? ? ? ?Signed,  ? ?Merri Ray, MD ?[place], [facility] ?[facility] ?08/27/21 ?1:35 PM ? ? ?

## 2021-08-27 NOTE — Patient Instructions (Addendum)
Based on my review of your echo and monitor results, overall things look to be okay.  I would wait to hear specifics from your cardiologist, but if you have not heard from him in the next week or 2 I recommending calling his office.  Can also discuss whether or not a loop monitor is needed at this point based on the results from the [facility] patch.  Follow-up with me in 3 months but let me know if there are concerns prior to that time.  Take care.  ?

## 2021-10-01 ENCOUNTER — Ambulatory Visit: Payer: Managed Care, Other (non HMO) | Admitting: Family Medicine

## 2021-10-01 ENCOUNTER — Ambulatory Visit: Payer: Self-pay

## 2021-10-01 ENCOUNTER — Ambulatory Visit (INDEPENDENT_AMBULATORY_CARE_PROVIDER_SITE_OTHER): Payer: Managed Care, Other (non HMO) | Admitting: Family Medicine

## 2021-10-01 ENCOUNTER — Ambulatory Visit (INDEPENDENT_AMBULATORY_CARE_PROVIDER_SITE_OTHER): Payer: Managed Care, Other (non HMO)

## 2021-10-01 VITALS — BP 118/80 | HR 77 | Ht 76.0 in | Wt 302.2 lb

## 2021-10-01 DIAGNOSIS — M25561 Pain in right knee: Secondary | ICD-10-CM | POA: Diagnosis not present

## 2021-10-01 DIAGNOSIS — G8929 Other chronic pain: Secondary | ICD-10-CM

## 2021-10-01 LAB — URIC ACID: Uric Acid, Serum: 7.3 mg/dL (ref 4.0–7.8)

## 2021-10-01 MED ORDER — GABAPENTIN 300 MG PO CAPS: 300.0000 mg | ORAL_CAPSULE | Freq: Every day | ORAL | 2 refills | Status: DC

## 2021-10-01 MED ORDER — PREDNISONE 50 MG PO TABS: ORAL_TABLET | ORAL | 0 refills | Status: DC

## 2021-10-01 NOTE — Patient Instructions (Addendum)
Thank you for coming in today.  ? ?Please get an Xray today before you leave  ? ?I've sent a prescription for Prednisone & Gabapentin to your pharmacy.  ? ?Please get labs today before you leave  ? ?Recheck back in 1 week ?

## 2021-10-01 NOTE — Progress Notes (Signed)
? ? ?Subjective:   ? ?CC: R knee pain ? ?I, Wendy Poet, LAT, ATC, am serving as scribe for Dr. Lynne Leader. ? ?HPI: Pt is a 51 y/o male c/o R knee pain ongoing since Monday. Pt was treated for bilat pulmonary embolism with left leg DVT in Oct 2022.  He locates his pain to the lateral aspect of the R knee and lateral portion of the proximal lower leg. ? ?R knee swelling: no ?R knee mechanical symptoms: no ?Aggravating factors: walking ?Treatments tried: Tylenol, ice ? ?Pertinent review of Systems: No fevers or chills ? 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 ? ? ?Objective:   ? ?Vitals:  ? 10/01/21 1029  ?BP: 118/80  ?Pulse: 77  ?SpO2: 95%  ? ?General: Well Developed, well nourished, and in no acute distress.  ? ?MSK: Right leg normal-appearing ?Right knee normal appearing without significant effusion ?Normal motion with no crepitation. ?Tender palpation at lateral joint line and just distal to the lateral joint line. ?Stable ligamentous exam however some pain is felt with anterior drawer testing of the lateral knee and at the fibular head area. ?Mildly positive McMurray's test. ?Intact strength however again patient had pain at the lateral knee/fibular head area with knee extension but not with knee flexion. ? ?Calf is nontender with normal foot and ankle motion and strength.  No palpable cords or erythema.  Mild lower extremity edema right leg equal to left.  Left calf is slightly larger than right. ? ?Lab and Radiology Results ? ?Diagnostic Limited MSK Ultrasound of: Right knee ?Quad tendon normal-appearing ?Trace joint effusion superior patellar space. ?Patellar tendon normal. ?Lateral joint line and fibular head relatively normal-appearing without significant effusion or visible abnormality. ?Medial joint line normal-appearing ?Impression: Typically normal-appearing ultrasound of the right knee. ? ? ?X-ray images right knee and lumbar spine obtained today  personally and independently interpreted ? ?Right knee: Mild medial compartment DJD.  No acute fractures. ? ?Lumbar spine: DDD and facet DJD L5-S1.  No acute fractures are visible. ? ?Await formal radiology review ? ?Impression and Recommendations:   ? ?Assessment and Plan: ?51 y.o. male with right lateral knee and lower leg pain.  Etiology is unclear at this time.  He does not have an injury history to explain this pain.  The location of pain is not typical exactly for L5 radiculopathy but that may be the best fit..  A ligamentous or internal knee issue at the lateral knee could be a source of pain as well such as lateral meniscus tear or lateral hamstring tendinitis or even an IT band syndrome distally could produce similar pain but none of those exactly fit his pain pattern. ? ?After discussion plan for further work-up including x-rays as above, uric acid evaluation to check for gout, and short course of prednisone and gabapentin.  Check back in about a week.  If needed may be transition into MRI in the near future. ? ?We also discussed checking for DVT.  This is less likely given his clinical presentation and he is currently on Eliquis.  However if needed certainly could recheck for DVT with a vascular ultrasound. ? ?PDMP not reviewed this encounter. ?Orders Placed This Encounter  ?Procedures  ? [place] LIMITED JOINT SPACE STRUCTURES LOW RIGHT(NO LINKED CHARGES)  ?  Order Specific Question:   Reason for Exam (SYMPTOM  OR DIAGNOSIS REQUIRED)  ?  Answer:   R knee pain  ?  Order Specific Question:   Preferred  imaging location?  ?  Answer:   [facility]  ? DG Knee AP/LAT W/Sunrise Right  ?  Standing Status:   Future  ?  Number of Occurrences:   1  ?  Standing Expiration Date:   10/31/2021  ?  Order Specific Question:   Reason for Exam (SYMPTOM  OR DIAGNOSIS REQUIRED)  ?  Answer:   R knee pain  ?  Order Specific Question:   Preferred imaging location?  ?  Answer:   [facility]  ? DG Lumbar  Spine 2-3 Views  ?  Standing Status:   Future  ?  Number of Occurrences:   1  ?  Standing Expiration Date:   10/31/2021  ?  Order Specific Question:   Reason for Exam (SYMPTOM  OR DIAGNOSIS REQUIRED)  ?  Answer:   lumbar radiculopathy  ?  Order Specific Question:   Preferred imaging location?  ?  Answer:   [facility]  ? Uric acid  ?  Standing Status:   Future  ?  Number of Occurrences:   1  ?  Standing Expiration Date:   10/02/2022  ? ?Meds ordered this encounter  ?Medications  ? predniSONE (DELTASONE) 50 MG tablet  ?  Sig: Take 1 pill daily for 5 days  ?  Dispense:  1 tablet  ?  Refill:  0  ? gabapentin (NEURONTIN) 300 MG capsule  ?  Sig: Take 1 capsule (300 mg total) by mouth at bedtime.  ?  Dispense:  30 capsule  ?  Refill:  2  ? ? ?Discussed warning signs or symptoms. Please see discharge instructions. Patient expresses understanding. ? ? ?The above documentation has been reviewed and is accurate and complete Lynne Leader, M.D. ? ?

## 2021-10-01 NOTE — Progress Notes (Signed)
Uric acid was mildly elevated at 7.3.  We will see what the radiologist has to say about your back and your knee probably tomorrow.

## 2021-10-05 NOTE — Progress Notes (Signed)
Lumbar spine x-ray shows arthritis changes in the low back.

## 2021-10-05 NOTE — Progress Notes (Signed)
Right knee x-ray shows mild arthritis.

## 2021-10-07 ENCOUNTER — Ambulatory Visit (INDEPENDENT_AMBULATORY_CARE_PROVIDER_SITE_OTHER): Payer: Managed Care, Other (non HMO) | Admitting: Family Medicine

## 2021-10-07 VITALS — BP 122/78 | HR 71 | Ht 76.0 in | Wt 312.0 lb

## 2021-10-07 DIAGNOSIS — M5416 Radiculopathy, lumbar region: Secondary | ICD-10-CM | POA: Diagnosis not present

## 2021-10-07 NOTE — Progress Notes (Signed)
? ?  I, Peterson Lombard, LAT, ATC acting as a scribe for Lynne Leader, MD. ? ?James Jordan is a 51 y.o. male who presents to [place] at [facility] today for f/u R knee and lower leg pain. Pt was treated for bilat pulmonary embolism with left leg DVT in Oct 2022 and is currently on Eliquis. Pt was last seen by Dr. Georgina Snell on 10/01/21 and was prescribed gabapentin, prednisone, XR were obtained, and uric acid was checked.  Today, pt reports R knee is feel mostly all better. Yesterday and today he notes only slight pain.  ? ?Dx testing: 10/01/21 L-spine & R knee XR ? 10/01/21 Labs (uric acid =7.3) ?  ?Pertinent review of systems: No fevers or chills ? ?Relevant historical information: History of pulmonary embolism on Eliquis.  PE occurred October 2022. ? ? ?Exam:  ?BP 122/78   Pulse 71   Ht '6\' 4"'$  (1.93 m)   Wt (!) 312 lb (141.5 kg)   SpO2 97%   BMI 37.98 kg/m?  ?General: Well Developed, well nourished, and in no acute distress.  ? ?MSK: Normal lumbar motion. ?Right knee normal-appearing ? ? ? ?Lab and Radiology Results ?EXAM: ?LUMBAR SPINE - 2-3 VIEW ?  ?COMPARISON:  None. ?  ?FINDINGS: ?Frontal and lateral views of the lumbar spine are obtained. There ?are 5 non-rib-bearing lumbar type vertebral bodies with grossly ?normal alignment. There is prominent spondylosis and facet ?hypertrophy at L4-5 and L5-S1. Remaining disc spaces are well ?preserved. Sacroiliac joints are unremarkable. ?  ?IMPRESSION: ?1. Spondylosis and facet hypertrophy at the L4-5 and L5-S1 levels. ?2. No acute bony abnormality. ?  ?  ?Electronically Signed ?  By: Randa Ngo M.D. ?  On: 10/03/2021 10:21 ?  ?EXAM: ?RIGHT KNEE 3 VIEWS ?  ?COMPARISON:  None. ?  ?FINDINGS: ?Frontal, lateral, and sunrise views of the right knee are obtained. ?No fracture, subluxation, or dislocation. Mild medial and ?patellofemoral compartmental joint space narrowing and osteophyte ?formation. No joint effusion. Soft tissues are unremarkable. ?   ?IMPRESSION: ?1. 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 No acute bony abnormality. ?  ?  ?Electronically Signed ?  By: Randa Ngo M.D. ?  On: 10/03/2021 10:19 ?  ?I, Lynne Leader, personally (independently) visualized and performed the interpretation of the images attached in this note. ? ? ? ? ?Assessment and Plan: ?51 y.o. male with right leg pain thought to be most likely to lumbar radiculopathy probably at L5.  He does have significant degenerative changes that could predispose him to this problem on lumbar spine x-ray.  He responded very well to prednisone and gabapentin.  Plan now for watchful waiting.  If symptoms should recur would recommend lumbar spine MRI in preparation for further diagnostic and epidural steroid injection planning. ? ?Uric acid was mildly elevated so gout is a possibility but less likely based on his clinical presentation. ? ?Recheck back as needed and as his symptoms progress. ? ? ? ? ?Discussed warning signs or symptoms. Please see discharge instructions. Patient expresses understanding. ? ? ?The above documentation has been reviewed and is accurate and complete Lynne Leader, M.D. ? ? ?

## 2021-10-07 NOTE — Patient Instructions (Addendum)
Thank you for coming in today.  ? ?Keep me updated if you're not doing well ? ?Recheck back as needed ?

## 2021-10-09 ENCOUNTER — Ambulatory Visit: Payer: Managed Care, Other (non HMO) | Admitting: Family Medicine

## 2021-11-11 ENCOUNTER — Ambulatory Visit: Payer: Self-pay

## 2021-11-11 ENCOUNTER — Ambulatory Visit: Payer: Managed Care, Other (non HMO) | Admitting: Family Medicine

## 2021-11-11 VITALS — BP 128/78 | HR 75 | Ht 76.0 in | Wt 306.2 lb

## 2021-11-11 DIAGNOSIS — G8929 Other chronic pain: Secondary | ICD-10-CM

## 2021-11-11 DIAGNOSIS — M25561 Pain in right knee: Secondary | ICD-10-CM

## 2021-11-11 NOTE — Patient Instructions (Addendum)
Thank you for coming in today.   You received a steroid injection in your right knee today. Seek immediate medical attention if the joint becomes red, extremely painful, or is oozing fluid.   Recheck in 6 weeks especially if not better.

## 2021-11-11 NOTE — Progress Notes (Signed)
I, James Jordan, LAT, ATC acting as a scribe for James Leader, MD.  James Jordan is a 51 y.o. male who presents to [place] at [facility] today for R knee pain.  Pt was treated for bilat pulmonary embolism with left leg DVT in Oct 2022 and is currently on Eliquis. Pt was for this issue on 4/20 and 4/26 and pt's uric acid was checked and he was prescribed prednisone and gabapentin. Pt pain was dx a lumbar radiculopathy likely at L5. Today, pt reports R knee pain location has moved to more the anterior-medial aspect of the R knee. Pt notes w/ certain rotational motions, R knee will give out on him.   R knee swelling: yes Aggravates: knee flexion  Dx testing: 10/01/21 L-spine & R knee XR             10/01/21 Labs (uric acid =7.3)   Pertinent review of systems: No fevers or chills  Relevant historical information: History of DVT   Exam:  BP 128/78   Pulse 75   Ht '6\' 4"'$  (1.93 m)   Wt (!) 306 lb 3.2 oz (138.9 kg)   SpO2 96%   BMI 37.27 kg/m  General: Well Developed, well nourished, and in no acute distress.   MSK: Right knee: Moderate effusion normal motion with crepitation.  Tender palpation medial joint line.  Stable ligamentous exam. Intact strength.    Lab and Radiology Results  Procedure: Real-time Ultrasound Guided Injection of the right knee superior lateral patellar space Device: Philips Affiniti 50G Images permanently stored and available for review in PACS Verbal informed consent obtained.  Discussed risks and benefits of procedure. Warned about infection, bleeding, hyperglycemia damage to structures among others. Patient expresses understanding and agreement Time-out conducted.   Noted no overlying erythema, induration, or other signs of local infection.   Skin prepped in a sterile fashion.   Local anesthesia: Topical Ethyl chloride.   With sterile technique and under real time ultrasound guidance: 40 mg of Kenalog and 2 mL of Marcaine  injected into knee joint. Fluid seen entering the knee joint.   Completed without difficulty   Pain immediately resolved suggesting accurate placement of the medication.   Advised to call if fevers/chills, erythema, induration, drainage, or persistent bleeding.   Images permanently stored and available for review in the ultrasound unit.  Impression: Technically successful ultrasound guided injection.   EXAM: RIGHT KNEE 3 VIEWS   COMPARISON:  None.   FINDINGS: Frontal, lateral, and sunrise views of the right knee are obtained. No fracture, subluxation, or dislocation. Mild medial and patellofemoral compartmental joint space narrowing and osteophyte formation. No joint effusion. Soft tissues are unremarkable.   IMPRESSION: 1. 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 No acute bony abnormality.     Electronically Signed   By: Randa Ngo M.D.   On: 10/03/2021 10:19   I, James Jordan, personally (independently) visualized and performed the interpretation of the images attached in this note.   Assessment and Plan: 51 y.o. male with right knee pain thought to be due to exacerbation of DJD versus degenerative medial meniscus tear.  Plan for steroid injection today.  Recheck back in 6 weeks.  Return sooner if needed.   PDMP not reviewed this encounter. Orders Placed This Encounter  Procedures   [place] LIMITED JOINT SPACE STRUCTURES LOW RIGHT(NO LINKED CHARGES)    Order Specific Question:   Reason for Exam (SYMPTOM  OR DIAGNOSIS REQUIRED)    Answer:  eval knee pain    Order Specific Question:   Preferred imaging location?    Answer:   [place]   No orders of the defined types were placed in this encounter.    Discussed warning signs or symptoms. Please see discharge instructions. Patient expresses understanding.   The above documentation has been reviewed and is accurate and complete James Jordan, M.D.

## 2021-11-15 ENCOUNTER — Encounter: Payer: Self-pay | Admitting: Family Medicine

## 2021-11-16 ENCOUNTER — Ambulatory Visit (INDEPENDENT_AMBULATORY_CARE_PROVIDER_SITE_OTHER): Payer: Managed Care, Other (non HMO) | Admitting: Family Medicine

## 2021-11-16 ENCOUNTER — Encounter: Payer: Self-pay | Admitting: Family Medicine

## 2021-11-16 VITALS — BP 148/92 | HR 68 | Ht 76.0 in | Wt 307.0 lb

## 2021-11-16 DIAGNOSIS — G8929 Other chronic pain: Secondary | ICD-10-CM | POA: Diagnosis not present

## 2021-11-16 DIAGNOSIS — M25561 Pain in right knee: Secondary | ICD-10-CM

## 2021-11-16 MED ORDER — TRAMADOL HCL 50 MG PO TABS: 50.0000 mg | ORAL_TABLET | Freq: Three times a day (TID) | ORAL | 0 refills | Status: DC | PRN

## 2021-11-16 NOTE — Patient Instructions (Addendum)
Thank you for coming in today.   You should hear from MRI scheduling within 1 week. If you do not hear please let me know.   Check back after MRI  

## 2021-11-16 NOTE — Progress Notes (Signed)
   I, Wendy Poet, LAT, ATC, am serving as scribe for Dr. Lynne Leader.  James Jordan is a 51 y.o. male who presents to [place] at [facility] today for f/u of R knee pain thought to be due to DJD.  He was last seen by Dr. Georgina Snell on 11/11/21 and had a R knee steroid injection.  Today, pt reports worsening R knee pain since Friday, November 13, 2021.  Pt didn't injury R knee on Friday, he just worked and was on his feet a lot. Pt locates pain to the anterior-medial aspect of the R knee.  Diagnostic testing: R knee and L-spine XR- 10/01/21;  Pertinent review of systems: No fevers or chills  Relevant historical information: History of DVT and PE.   Exam:  BP (!) 148/92   Pulse 68   Ht '6\' 4"'$  (1.93 m)   Wt (!) 307 lb (139.3 kg)   SpO2 98%   BMI 37.37 kg/m  General: Well Developed, well nourished, and in no acute distress.   MSK: Right knee: Mild effusion.  Tender palpation medial joint line. Normal motion. Stable ligamentous exam. Positive medial McMurray's test. Intact strength.    Lab and Radiology Results EXAM: RIGHT KNEE 3 VIEWS   COMPARISON:  None.   FINDINGS: Frontal, lateral, and sunrise views of the right knee are obtained. No fracture, subluxation, or dislocation. Mild medial and patellofemoral compartmental joint space narrowing and osteophyte formation. No joint effusion. Soft tissues are unremarkable.   IMPRESSION: 1. 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 No acute bony abnormality.     Electronically Signed   By: Randa Ngo M.D.   On: 10/03/2021 10:19 I, Lynne Leader, personally (independently) visualized and performed the interpretation of the images attached in this note.     Assessment and Plan: 51 y.o. male with right knee pain.  Etiology is unclear but still thought to be perhaps stress reaction or degenerative meniscus tear.  He has not done well with conservative management strategies including  steroid injection.  His pain is bad enough that he is having trouble working.  Will prescribe tramadol and reduce work hours.  Proceed to MRI to further characterize source of pain for potential surgical planning or other injection planning.  Recheck after MRI.   PDMP reviewed during this encounter. Orders Placed This Encounter  Procedures   MR Knee Right Wo Contrast    Standing Status:   Future    Standing Expiration Date:   11/17/2022    Order Specific Question:   What is the patient's sedation requirement?    Answer:   No Sedation    Order Specific Question:   Does the patient have a pacemaker or implanted devices?    Answer:   No    Order Specific Question:   Preferred imaging location?    Answer:   [facility] (table limit-350lbs)   Meds ordered this encounter  Medications   traMADol (ULTRAM) 50 MG tablet    Sig: Take 1 tablet (50 mg total) by mouth every 8 (eight) hours as needed for severe pain.    Dispense:  15 tablet    Refill:  0     Discussed warning signs or symptoms. Please see discharge instructions. Patient expresses understanding.   The above documentation has been reviewed and is accurate and complete Lynne Leader, M.D.

## 2021-11-23 ENCOUNTER — Telehealth: Payer: Self-pay

## 2021-11-23 ENCOUNTER — Ambulatory Visit: Payer: Managed Care, Other (non HMO) | Admitting: Family Medicine

## 2021-11-23 NOTE — Telephone Encounter (Signed)
Called pt to reschedule appointment on 6/14 he is currently scheduled for 1:00pm when Dr Carlota Raspberry has a meeting will need to one of the same days earlier in the day

## 2021-11-24 ENCOUNTER — Other Ambulatory Visit: Payer: Managed Care, Other (non HMO)

## 2021-11-24 ENCOUNTER — Ambulatory Visit (INDEPENDENT_AMBULATORY_CARE_PROVIDER_SITE_OTHER): Payer: Managed Care, Other (non HMO)

## 2021-11-24 ENCOUNTER — Encounter: Payer: Self-pay | Admitting: Family Medicine

## 2021-11-24 DIAGNOSIS — I2699 Other pulmonary embolism without acute cor pulmonale: Secondary | ICD-10-CM

## 2021-11-24 DIAGNOSIS — M25561 Pain in right knee: Secondary | ICD-10-CM

## 2021-11-24 DIAGNOSIS — I82432 Acute embolism and thrombosis of left popliteal vein: Secondary | ICD-10-CM

## 2021-11-24 DIAGNOSIS — G8929 Other chronic pain: Secondary | ICD-10-CM | POA: Diagnosis not present

## 2021-11-25 ENCOUNTER — Ambulatory Visit: Payer: Managed Care, Other (non HMO) | Admitting: Family Medicine

## 2021-11-25 MED ORDER — APIXABAN 5 MG PO TABS: 5.0000 mg | ORAL_TABLET | Freq: Two times a day (BID) | ORAL | 7 refills | Status: DC

## 2021-11-25 NOTE — Telephone Encounter (Signed)
Appointment scheduled June 19.  Refill ordered.

## 2021-11-26 ENCOUNTER — Ambulatory Visit (INDEPENDENT_AMBULATORY_CARE_PROVIDER_SITE_OTHER): Payer: Managed Care, Other (non HMO) | Admitting: Family Medicine

## 2021-11-26 VITALS — BP 138/88 | HR 69 | Ht 76.0 in | Wt 303.2 lb

## 2021-11-26 DIAGNOSIS — G8929 Other chronic pain: Secondary | ICD-10-CM | POA: Diagnosis not present

## 2021-11-26 DIAGNOSIS — M25561 Pain in right knee: Secondary | ICD-10-CM

## 2021-11-26 DIAGNOSIS — M84469A Pathological fracture, unspecified tibia and fibula, initial encounter for fracture: Secondary | ICD-10-CM

## 2021-11-26 DIAGNOSIS — M1711 Unilateral primary osteoarthritis, right knee: Secondary | ICD-10-CM | POA: Diagnosis not present

## 2021-11-26 NOTE — Progress Notes (Signed)
MRI of the knee shows a stress fracture and a meniscus tear as the source of your pain.  This is why you have not gotten better.  Please use crutches to keep the weight off of the knee and use a hinged knee brace.  You can purchase a hinged knee brace at a medical supply company over-the-counter for typically around $30.  Recommend return to clinic to go over the results of the MRI in full detail and discuss treatment plan and options.  Stress fracture was not visible on your x-ray and only visible on this MRI.

## 2021-11-26 NOTE — Patient Instructions (Addendum)
Thank you for coming in today.   You should hear about the knee offloading brace.   Try to use a crutch from a a medical supply company like [facility].  Recheck in 1 month.   Keep me updated.

## 2021-11-26 NOTE — Progress Notes (Signed)
Note duplication 

## 2021-11-27 NOTE — Progress Notes (Signed)
I, Peterson Lombard, LAT, ATC acting as a scribe for Lynne Leader, MD.  James Jordan is a 51 y.o. male who presents to [place] at [facility] today for f/u R knee pain and MRI review. Pt was last seen by Dr. Georgina Snell on 11/16/21 and was prescribed tramadol, provided a letter to reduct work hours, and a MRI was ordered. Today, pt reports R knee pain is the same. Pt only saw Dr. Clovis Riley result note this morning, so he has not been using crutches nor knee brace.  Dx imaging: 11/24/21 R knee MRI  10/01/21 R knee & L-spine XR  Pertinent review of systems: No fevers or chills  Relevant historical information: History of DVT and pulmonary embolism   Exam:  BP 138/88   Pulse 69   Ht '6\' 4"'$  (1.93 m)   Wt (!) 303 lb 3.2 oz (137.5 kg)   SpO2 95%   BMI 36.91 kg/m  General: Well Developed, well nourished, and in no acute distress.   MSK: Right knee: Tender palpation medial joint line. Laxity medial joint line.   Lab and Radiology Results No results found for this or any previous visit (from the past 72 hour(s)). MR Knee Right Wo Contrast  Result Date: 11/25/2021 CLINICAL DATA:  Knee trauma. Medial right knee pain and weakness for 1 month. EXAM: MRI OF THE RIGHT KNEE WITHOUT CONTRAST TECHNIQUE: Multiplanar, multisequence MR imaging of the knee was performed. No intravenous contrast was administered. COMPARISON:  Right knee radiographs 10/01/2021 FINDINGS: MENISCI Medial meniscus: There is absence of normal meniscal tissue throughout the 10 mm of the posterior horn of the medial meniscus closer to the root (sagittal series 7 images 21 and 22 and coronal series 6, image 11). There is intermediate proton density signal within the junction of the body and posterior horn of the medial meniscus. Mild extrusion of the body of the medial meniscus. Lateral meniscus: Small oblique undersurface tear within the central third of the mid AP dimension of the body of the lateral meniscus (coronal  series 6, image 14). LIGAMENTS Cruciates: The ACL and PCL are intact. Collaterals: Mild edema around the medial collateral ligament compatible with a grade 1 sprain. The fibular collateral ligament, biceps femoris tendon, iliotibial band, and popliteus tendon are intact. CARTILAGE Patellofemoral: Full-thickness cartilage loss within the superior aspect of the patellar apex with mild subchondral edema (sagittal image 17 and axial image 7). High-grade focal thinning of the inferior aspect of the medial patellar facet cartilage (axial image 9) with mild subchondral marrow edema. Moderate trochlear notch and medial trochlear cartilage thinning. Medial: Diffuse high-grade partial to full-thickness cartilage loss throughout the mid to posteromedial tibial plateau and mid to posterior weight-bearing medial femoral condyle. There is horizontal linear subchondral decreased T1 and decreased T2 signal within the medial and mildly posterior aspect of the weight-bearing medial femoral condyle in a region measuring up to 12 mm in AP dimension and 5 mm in transverse dimension (sagittal image 27 and coronal image 14) with high-grade surrounding marrow edema subchondral insufficiency fracture. No overlying cortical collapse. Within the adjacent posteromedial aspect of the medial tibial plateau there is also horizontal linear decreased T1 and decreased T2 signal measuring up to 9 mm in transverse dimension and 14 mm in AP dimension with high-grade surrounding marrow edema, an additional acute to subacute subchondral insufficiency fracture. No overlying cortical collapse. Lateral: Moderate thinning of the lateral aspect of the weight-bearing lateral femoral condyle. Joint: Moderatejoint effusion. Normal Hoffa's fat pad.  No plical thickening. Popliteal Fossa:  No Baker's cyst. Extensor Mechanism:  Intact quadriceps tendon and patellar tendon. Bones:  Please see "medial cartilage" section above. Other: None. IMPRESSION: 1. Acute to  subacute subchondral insufficiency fractures within the medial posterior aspect of the weight-bearing medial femoral condyle and medial tibial plateau with high-grade surrounding marrow edema but no overlying cortical collapse. 2. Mild-to-moderate patellofemoral and moderate to high-grade medial compartment cartilage degenerative changes. 3. Absence of normal meniscal tissue within the root of the posterior horn of the medial meniscus, a focal complex tear with mild extrusion of the body of the medial meniscus. 4. Small oblique undersurface tear within the central third of the mid AP dimension of the body of the lateral meniscus. 5. Moderate joint effusion. Electronically Signed   By: Yvonne Kendall M.D.   On: 11/25/2021 14:16     I, Lynne Leader, personally (independently) visualized and performed the interpretation of the images attached in this note.   Assessment and Plan: 51 y.o. male with right knee pain primarily due to the insufficiency fracture at the medial tibia.  However the insufficiency fracture is primarily due to the significant degenerative changes seen in the medial compartment.  The excessive loading of the medial compartment is the primary source of his underlying pain.  Without long-term offloading I do not think he is going to have a good long-term solution.  For short-term plan on significant reduced weightbearing with crutches and relative rest.  In the long-term a medial off loader knee brace would help with this instability and with pain due to DJD and insufficiency fracture.  Ultimately he may require total knee replacement.   PDMP not reviewed this encounter. No orders of the defined types were placed in this encounter.  No orders of the defined types were placed in this encounter.    Discussed warning signs or symptoms. Please see discharge instructions. Patient expresses understanding.   The above documentation has been reviewed and is accurate and complete Lynne Leader,  M.D.

## 2021-11-30 ENCOUNTER — Ambulatory Visit: Payer: Managed Care, Other (non HMO) | Admitting: Family Medicine

## 2021-11-30 ENCOUNTER — Encounter: Payer: Self-pay | Admitting: Family Medicine

## 2021-11-30 VITALS — BP 128/78 | HR 67 | Temp 98.1°F | Resp 16 | Ht 76.0 in | Wt 302.0 lb

## 2021-11-30 DIAGNOSIS — R002 Palpitations: Secondary | ICD-10-CM | POA: Diagnosis not present

## 2021-11-30 DIAGNOSIS — I491 Atrial premature depolarization: Secondary | ICD-10-CM

## 2021-11-30 DIAGNOSIS — I2699 Other pulmonary embolism without acute cor pulmonale: Secondary | ICD-10-CM

## 2021-11-30 DIAGNOSIS — I82432 Acute embolism and thrombosis of left popliteal vein: Secondary | ICD-10-CM | POA: Diagnosis not present

## 2021-11-30 DIAGNOSIS — N529 Male erectile dysfunction, unspecified: Secondary | ICD-10-CM

## 2021-11-30 MED ORDER — SILDENAFIL CITRATE 50 MG PO TABS
25.0000 mg | ORAL_TABLET | Freq: Every day | ORAL | 6 refills | Status: DC | PRN
Start: 2021-11-30 — End: 2023-07-21

## 2021-11-30 NOTE — Patient Instructions (Addendum)
If palpitations return, I recommend discussing with cardiology to decide if other testing needed.  Continue eliquis, and keep follow up with hematology.  Viagra lowest dose for now. Stop if any new side effects. Can ask cardiology if ok to use, but I do not see an issue at this time.   Erectile Dysfunction Erectile dysfunction (ED) is the inability to get or keep an erection in order to have sexual intercourse. ED is considered a symptom of an underlying disorder and is not considered a disease. ED may include: Inability to get an erection. Lack of enough hardness of the erection to allow penetration. Loss of erection before sex is finished. What are the causes? This condition may be caused by: Physical causes, such as: Artery problems. This may include heart disease, high blood pressure, atherosclerosis, and diabetes. Hormonal problems, such as low testosterone. Obesity. Nerve problems. This may include back or pelvic injuries, multiple sclerosis, Parkinson's disease, spinal cord injury, and stroke. Certain medicines, such as: Pain relievers. Antidepressants. Blood pressure medicines and water pills (diuretics). Cancer medicines. Antihistamines. Muscle relaxants. Lifestyle factors, such as: Use of drugs such as marijuana, cocaine, or opioids. Excessive use of alcohol. Smoking. Lack of physical activity or exercise. Psychological causes, such as: Anxiety or stress. Sadness or depression. Exhaustion. Fear about sexual performance. Guilt. What are the signs or symptoms? Symptoms of this condition include: Inability to get an erection. Lack of enough hardness of the erection to allow penetration. Loss of the erection before sex is finished. Sometimes having normal erections, but with frequent unsatisfactory episodes. Low sexual satisfaction in either partner due to erection problems. A curved penis occurring with erection. The curve may cause pain, or the penis may be too curved  to allow for intercourse. Never having nighttime or morning erections. How is this diagnosed? This condition is often diagnosed by: Performing a physical exam to find other diseases or specific problems with the penis. Asking you detailed questions about the problem. Doing tests, such as: Blood tests to check for diabetes mellitus or high cholesterol, or to measure hormone levels. Other tests to check for underlying health conditions. An ultrasound exam to check for scarring. A test to check blood flow to the penis. Doing a sleep study at home to measure nighttime erections. How is this treated? This condition may be treated by: Medicines, such as: Medicine taken by mouth to help you achieve an erection (oral medicine). Hormone replacement therapy to replace low testosterone levels. Medicine that is injected into the penis. Your health care provider may instruct you how to give yourself these injections at home. Medicine that is delivered with a short applicator tube. The tube is inserted into the opening at the tip of the penis, which is the opening of the urethra. A tiny pellet of medicine is put in the urethra. The pellet dissolves and enhances erectile function. This is also called MUSE (medicated urethral system for erections) therapy. Vacuum pump. This is a pump with a ring on it. The pump and ring are placed on the penis and used to create pressure that helps the penis become erect. Penile implant surgery. In this procedure, you may receive: An inflatable implant. This consists of cylinders, a pump, and a reservoir. The cylinders can be inflated with a fluid that helps to create an erection, and they can be deflated after intercourse. A semi-rigid implant. This consists of two silicone rubber rods. The rods provide some rigidity. They are also flexible, so the penis can both  curve downward in its normal position and become straight for sexual intercourse. Blood vessel surgery to improve  blood flow to the penis. During this procedure, a blood vessel from a different part of the body is placed into the penis to allow blood to flow around (bypass) damaged or blocked blood vessels. Lifestyle changes, such as exercising more, losing weight, and quitting smoking. Follow these instructions at home: Medicines  Take over-the-counter and prescription medicines only as told by your health care provider. Do not increase the dosage without first discussing it with your health care provider. If you are using self-injections, do injections as directed by your health care provider. Make sure you avoid any veins that are on the surface of the penis. After giving an injection, apply pressure to the injection site for 5 minutes. Talk to your health care provider about how to prevent headaches while taking ED medicines. These medicines may cause a sudden headache due to the increase in blood flow in your body. General instructions Exercise regularly, as directed by your health care provider. Work with your health care provider to lose weight, if needed. Do not use any products that contain nicotine or tobacco. These products include cigarettes, chewing tobacco, and vaping devices, such as e-cigarettes. If you need help quitting, ask your health care provider. Before using a vacuum pump, read the instructions that come with the pump and discuss any questions with your health care provider. Keep all follow-up visits. This is important. Contact a health care provider if: You feel nauseous. You are vomiting. You get sudden headaches while taking ED medicines. You have any concerns about your sexual health. Get help right away if: You are taking oral or injectable medicines and you have an erection that lasts longer than 4 hours. If your health care provider is unavailable, go to the nearest emergency room for evaluation. An erection that lasts much longer than 4 hours can result in permanent damage to  your penis. You have severe pain in your groin or abdomen. You develop redness or severe swelling of your penis. You have redness spreading at your groin or lower abdomen. You are unable to urinate. You experience chest pain or a rapid heartbeat (palpitations) after taking oral medicines. These symptoms may represent a serious problem that is an emergency. Do not wait to see if the symptoms will go away. Get medical help right away. Call your local emergency services (911 in the [place] Do not drive yourself to the hospital. Summary Erectile dysfunction (ED) is the inability to get or keep an erection during sexual intercourse. This condition is diagnosed based on a physical exam, your symptoms, and tests to determine the cause. Treatment varies depending on the cause and may include medicines, hormone therapy, surgery, or a vacuum pump. You may need follow-up visits to make sure that you are using your medicines or devices correctly. Get help right away if you are taking or injecting medicines and you have an erection that lasts longer than 4 hours. This information is not intended to replace advice given to you by your health care provider. Make sure you discuss any questions you have with your health care provider. Document Revised: 08/27/2020 Document Reviewed: 08/27/2020 Elsevier Patient Education  [employer].

## 2021-11-30 NOTE — Progress Notes (Unsigned)
Subjective:  Patient ID: James Jordan, male    DOB: 1971-01-20  Age: 51 y.o. MRN: [id number]  CC:  Chief Complaint  Patient presents with   Palpitations    Pt here for 3 month notes no palps since last visit     HPI James Jordan presents for   Palpitations, premature atrial contractions Last discussed in March.  Has been evaluated by cardiology.  Initially differential of A-fib but he is on anticoagulation with history of PE.  Zio patch with normal sinus rhythm, occasional PAC and PVCs.  Brief runs of PACs without symptoms, no A-fib.  Repeat echo obtained to evaluate his PA pressure as pulmonary hypertension on echo in October associated with PE, improved on repeat testing March 15 as no significant tricuspid regurgitation.  Normal valvular function and normal LV/RV function.  Option of implantable loop monitor to be discussed with cardiology.  No recurrence of palpitations, no plan for additional monitoring.   History of DVT, PE Anticoagulated with Eliquis.  Plan for DVT treatment for 3 to 6 months, PE treatment for 1 year, through October of this year.  Denies any new bleeding. Has appt planned with hematology in October. No new LE swelling, CP or dyspnea.  Followed by Dr. Georgina Snell for knee pain. Wearing brace. MRI last week - meniscus tear, other stress injuries. New brace being fitted.   Erectile dysfunction: Decreased erections past year, no treatments. Less morning erection. No numbness, no back pain or leg weakness.  Relationship going well.  No CP/dyspnea with exercise.      History Patient Active Problem List   Diagnosis Date Noted   Bilateral lower extremity edema 06/02/2021   Bilateral pulmonary embolism ([place]) 04/05/2021   Acute deep vein thrombosis (DVT) of popliteal vein of left lower extremity (HCC)    PAC (premature atrial contraction) 06/01/2016   Obesity, Class II, BMI 35-39.9 04/01/2016   Past Medical History:  Diagnosis Date   ACL tear - left knee  2002   Per patient, not repaired    Umbilical hernia 3614   Past Surgical History:  Procedure Laterality Date   HERNIA REPAIR  4315   Umbilical   No Known Allergies Prior to Admission medications   Medication Sig Start Date End Date Taking? Authorizing Provider  apixaban (ELIQUIS) 5 MG TABS tablet Take 1 tablet (5 mg total) by mouth 2 (two) times daily. 11/25/21  Yes Wendie Agreste, MD  traMADol (ULTRAM) 50 MG tablet Take 1 tablet (50 mg total) by mouth every 8 (eight) hours as needed for severe pain. Patient not taking: Reported on 11/26/2021 11/16/21   Gregor Hams, MD   Social History   Socioeconomic History   Marital status: Married    Spouse name: Not on file   Number of children: Not on file   Years of education: Not on file   Highest education level: Not on file  Occupational History   Occupation: warehouse  Tobacco Use   Smoking status: Never    Passive exposure: Never   Smokeless tobacco: Current    Types: Snuff  Substance and Sexual Activity   Alcohol use: No   Drug use: No   Sexual activity: Yes  Other Topics Concern   Not on file  Social History Narrative   Lives with his wife.   Social Determinants of Health   Financial Resource Strain: Not on file  Food Insecurity: Not on file  Transportation Needs: Not on file  Physical Activity: Not  on file  Stress: Not on file  Social Connections: Not on file  Intimate Partner Violence: Not on file    Review of Systems Per HPI  Objective:   Vitals:   11/30/21 1337  BP: 128/78  Pulse: 67  Resp: 16  Temp: 98.1 F (36.7 C)  TempSrc: Temporal  SpO2: 96%  Weight: (!) 302 lb (137 kg)  Height: '6\' 4"'$  (1.93 m)     Physical Exam Vitals reviewed.  Constitutional:      Appearance: He is well-developed.  HENT:     Head: Normocephalic and atraumatic.  Neck:     Vascular: No carotid bruit or JVD.  Cardiovascular:     Rate and Rhythm: Normal rate and regular rhythm.     Heart sounds: Normal heart  sounds. No murmur heard.    No gallop.     Comments: No ectopy appreciated. Pulmonary:     Effort: Pulmonary effort is normal.     Breath sounds: Normal breath sounds. No rales.     Comments: Clear, normal effort.  No distress. Musculoskeletal:     Right lower leg: No edema.     Left lower leg: No edema.  Skin:    General: Skin is warm and dry.  Neurological:     Mental Status: He is alert and oriented to person, place, and time.  Psychiatric:        Mood and Affect: Mood normal.        Assessment & Plan:  James Jordan is a 51 y.o. male . Palpitations PAC (premature atrial contraction)  -Improved.  RTC precautions, cardiology follow-up if recurrence to decide on further testing.  Bilateral pulmonary embolism (HCC) Acute deep vein thrombosis (DVT) of popliteal vein of left lower extremity (HCC)  -Asymptomatic, tolerating anticoagulation without new bleeding.  Plan for anticoagulation through October.  Keep follow-up with hematologist.  Erectile dysfunction, unspecified erectile dysfunction type - Plan: sildenafil (VIAGRA) 50 MG tablet  - viagra Rx given - use lowest effective dose. Side effects discussed (including but not limited to headache/flushing, blue discoloration of vision, possible vascular steal and risk of cardiac effects if underlying unknown coronary artery disease, and permanent sensorineural hearing loss). Understanding expressed.  If no relief with medication, urology follow-up discussed.   Meds ordered this encounter  Medications   sildenafil (VIAGRA) 50 MG tablet    Sig: Take 0.5-1 tablets (25-50 mg total) by mouth daily as needed for erectile dysfunction.    Dispense:  10 tablet    Refill:  6   Patient Instructions  If palpitations return, I recommend discussing with cardiology to decide if other testing needed.  Continue eliquis, and keep follow up with hematology.  Viagra lowest dose for now. Stop if any new side effects. Can ask cardiology if ok  to use, but I do not see an issue at this time.   Erectile Dysfunction Erectile dysfunction (ED) is the inability to get or keep an erection in order to have sexual intercourse. ED is considered a symptom of an underlying disorder and is not considered a disease. ED may include: Inability to get an erection. Lack of enough hardness of the erection to allow penetration. Loss of erection before sex is finished. What are the causes? This condition may be caused by: Physical causes, such as: Artery problems. This may include heart disease, high blood pressure, atherosclerosis, and diabetes. Hormonal problems, such as low testosterone. Obesity. Nerve problems. This may include back or pelvic injuries, multiple sclerosis, Parkinson's  disease, spinal cord injury, and stroke. Certain medicines, such as: Pain relievers. Antidepressants. Blood pressure medicines and water pills (diuretics). Cancer medicines. Antihistamines. Muscle relaxants. Lifestyle factors, such as: Use of drugs such as marijuana, cocaine, or opioids. Excessive use of alcohol. Smoking. Lack of physical activity or exercise. Psychological causes, such as: Anxiety or stress. Sadness or depression. Exhaustion. Fear about sexual performance. Guilt. What are the signs or symptoms? Symptoms of this condition include: Inability to get an erection. Lack of enough hardness of the erection to allow penetration. Loss of the erection before sex is finished. Sometimes having normal erections, but with frequent unsatisfactory episodes. Low sexual satisfaction in either partner due to erection problems. A curved penis occurring with erection. The curve may cause pain, or the penis may be too curved to allow for intercourse. Never having nighttime or morning erections. How is this diagnosed? This condition is often diagnosed by: Performing a physical exam to find other diseases or specific problems with the penis. Asking you  detailed questions about the problem. Doing tests, such as: Blood tests to check for diabetes mellitus or high cholesterol, or to measure hormone levels. Other tests to check for underlying health conditions. An ultrasound exam to check for scarring. A test to check blood flow to the penis. Doing a sleep study at home to measure nighttime erections. How is this treated? This condition may be treated by: Medicines, such as: Medicine taken by mouth to help you achieve an erection (oral medicine). Hormone replacement therapy to replace low testosterone levels. Medicine that is injected into the penis. Your health care provider may instruct you how to give yourself these injections at home. Medicine that is delivered with a short applicator tube. The tube is inserted into the opening at the tip of the penis, which is the opening of the urethra. A tiny pellet of medicine is put in the urethra. The pellet dissolves and enhances erectile function. This is also called MUSE (medicated urethral system for erections) therapy. Vacuum pump. This is a pump with a ring on it. The pump and ring are placed on the penis and used to create pressure that helps the penis become erect. Penile implant surgery. In this procedure, you may receive: An inflatable implant. This consists of cylinders, a pump, and a reservoir. The cylinders can be inflated with a fluid that helps to create an erection, and they can be deflated after intercourse. A semi-rigid implant. This consists of two silicone rubber rods. The rods provide some rigidity. They are also flexible, so the penis can both curve downward in its normal position and become straight for sexual intercourse. Blood vessel surgery to improve blood flow to the penis. During this procedure, a blood vessel from a different part of the body is placed into the penis to allow blood to flow around (bypass) damaged or blocked blood vessels. Lifestyle changes, such as exercising  more, losing weight, and quitting smoking. Follow these instructions at home: Medicines  Take over-the-counter and prescription medicines only as told by your health care provider. Do not increase the dosage without first discussing it with your health care provider. If you are using self-injections, do injections as directed by your health care provider. Make sure you avoid any veins that are on the surface of the penis. After giving an injection, apply pressure to the injection site for 5 minutes. Talk to your health care provider about how to prevent headaches while taking ED medicines. These medicines may cause a sudden  headache due to the increase in blood flow in your body. General instructions Exercise regularly, as directed by your health care provider. Work with your health care provider to lose weight, if needed. Do not use any products that contain nicotine or tobacco. These products include cigarettes, chewing tobacco, and vaping devices, such as e-cigarettes. If you need help quitting, ask your health care provider. Before using a vacuum pump, read the instructions that come with the pump and discuss any questions with your health care provider. Keep all follow-up visits. This is important. Contact a health care provider if: You feel nauseous. You are vomiting. You get sudden headaches while taking ED medicines. You have any concerns about your sexual health. Get help right away if: You are taking oral or injectable medicines and you have an erection that lasts longer than 4 hours. If your health care provider is unavailable, go to the nearest emergency room for evaluation. An erection that lasts much longer than 4 hours can result in permanent damage to your penis. You have severe pain in your groin or abdomen. You develop redness or severe swelling of your penis. You have redness spreading at your groin or lower abdomen. You are unable to urinate. You experience chest pain or a  rapid heartbeat (palpitations) after taking oral medicines. These symptoms may represent a serious problem that is an emergency. Do not wait to see if the symptoms will go away. Get medical help right away. Call your local emergency services (911 in the [place] Do not drive yourself to the hospital. Summary Erectile dysfunction (ED) is the inability to get or keep an erection during sexual intercourse. This condition is diagnosed based on a physical exam, your symptoms, and tests to determine the cause. Treatment varies depending on the cause and may include medicines, hormone therapy, surgery, or a vacuum pump. You may need follow-up visits to make sure that you are using your medicines or devices correctly. Get help right away if you are taking or injecting medicines and you have an erection that lasts longer than 4 hours. This information is not intended to replace advice given to you by your health care provider. Make sure you discuss any questions you have with your health care provider. Document Revised: 08/27/2020 Document Reviewed: 08/27/2020 Elsevier Patient Education  [place],   Merri Ray, MD [place] 12/01/21 2:48 PM

## 2021-12-04 ENCOUNTER — Encounter: Payer: Self-pay | Admitting: Family Medicine

## 2021-12-22 NOTE — Progress Notes (Signed)
   James Jordan, am serving as a Education administrator for Dr. Lynne Leader.  James Jordan is a 51 y.o. male who presents to [place] [facility] today for f/u of R knee pain due to an insufficiency fracture at the medial tibia.  He was last seen by Dr. Georgina Snell on 11/26/21 and was advised to use crutches to decrease weight bearing through his R knee. An order was also placed for a knee medial offloader brace for which the pt was fitted on 12/21/21.  Today, pt reports  he is doing okay, is working about 4 hours a day with the brace and by the end of the four hours he is ready to be off of it. Still feeling sore on that medial right knee. Patient is supposed to be going back to 8 hours a day next week and needs to know if that should be less. If so needs another note for work. Not needing crutches did use a cane and liked that better.  Dx imaging: 11/24/21 R knee MRI             10/01/21 R knee & L-spine XR  Pertinent review of systems: No fevers or chills  Relevant historical information: History of pulmonary embolism.   Exam:  BP 122/82   Pulse 92   Ht '6\' 4"'$  (1.93 m)   Wt (!) 305 lb (138.3 kg)   SpO2 95%   BMI 37.13 kg/m  General: Well Developed, well nourished, and in no acute distress.   MSK: Right knee normal. Tender palpation mildly at medial joint line. Normal knee motion.     Assessment and Plan: 51 y.o. male with right knee medial tibial plateau fracture.  Improving with a medial off loader knee brace.  He was returned to work 4 hours/day this week.  The plan was to return him to full 8 hours/day next week.  I do not think that is going to be feasible.  We will try 8 hours/day on July 24 but can change the plan based on how he feels.  Check back in 2 months otherwise.  Slowly advance activity as tolerated.   Discussed warning signs or symptoms. Please see discharge instructions. Patient expresses understanding.   The above documentation has been reviewed and is  accurate and complete Lynne Leader, M.D.

## 2021-12-23 ENCOUNTER — Ambulatory Visit: Payer: Managed Care, Other (non HMO) | Admitting: Family Medicine

## 2021-12-23 VITALS — BP 122/82 | HR 92 | Ht 76.0 in | Wt 305.0 lb

## 2021-12-23 DIAGNOSIS — M84469A Pathological fracture, unspecified tibia and fibula, initial encounter for fracture: Secondary | ICD-10-CM | POA: Diagnosis not present

## 2021-12-23 NOTE — Patient Instructions (Signed)
Thank you for coming in today.   Continue the brace.   4 hour work day until July 24th. We can extend or modify it next week based on how you feel. Maybe we can do a 6 hour day the week of the 24th but I really want to get you back to normal hours.   Recheck in 2 months based on how you are feeling .

## 2022-01-10 ENCOUNTER — Other Ambulatory Visit: Payer: Self-pay

## 2022-01-10 ENCOUNTER — Encounter (HOSPITAL_COMMUNITY): Payer: Self-pay

## 2022-01-10 ENCOUNTER — Emergency Department (HOSPITAL_COMMUNITY)
Admission: EM | Admit: 2022-01-10 | Discharge: 2022-01-10 | Disposition: A | Discharge status: Home / Self Care | Payer: Managed Care, Other (non HMO) | Attending: Emergency Medicine | Admitting: Emergency Medicine

## 2022-01-10 ENCOUNTER — Emergency Department (HOSPITAL_COMMUNITY): Payer: Managed Care, Other (non HMO)

## 2022-01-10 DIAGNOSIS — R002 Palpitations: Secondary | ICD-10-CM | POA: Insufficient documentation

## 2022-01-10 DIAGNOSIS — Z7901 Long term (current) use of anticoagulants: Secondary | ICD-10-CM | POA: Insufficient documentation

## 2022-01-10 DIAGNOSIS — K429 Umbilical hernia without obstruction or gangrene: Secondary | ICD-10-CM | POA: Diagnosis not present

## 2022-01-10 LAB — CBC
HCT: 48.8 % (ref 39.0–52.0)
Hemoglobin: 16.2 g/dL (ref 13.0–17.0)
MCH: 30.4 pg (ref 26.0–34.0)
MCHC: 33.2 g/dL (ref 30.0–36.0)
MCV: 91.6 fL (ref 80.0–100.0)
Platelets: 301 10*3/uL (ref 150–400)
RBC: 5.33 MIL/uL (ref 4.22–5.81)
RDW: 13.2 % (ref 11.5–15.5)
WBC: 6.4 10*3/uL (ref 4.0–10.5)
nRBC: 0 % (ref 0.0–0.2)

## 2022-01-10 LAB — BASIC METABOLIC PANEL
Anion gap: 5 (ref 5–15)
BUN: 13 mg/dL (ref 6–20)
CO2: 25 mmol/L (ref 22–32)
Calcium: 9.3 mg/dL (ref 8.9–10.3)
Chloride: 109 mmol/L (ref 98–111)
Creatinine, Ser: 1.08 mg/dL (ref 0.61–1.24)
GFR, Estimated: >60 mL/min (ref >60)
Glucose, Bld: 115 mg/dL — ABNORMAL HIGH (ref 70–99)
Potassium: 4.4 mmol/L (ref 3.5–5.1)
Sodium: 139 mmol/L (ref 135–145)

## 2022-01-10 LAB — TROPONIN I (HIGH SENSITIVITY)
Troponin I (High Sensitivity): 7 ng/L (ref <18)
Troponin I (High Sensitivity): 9 ng/L (ref <18)

## 2022-01-10 NOTE — ED Provider Notes (Signed)
[facility] EMERGENCY DEPARTMENT Provider Note   CSN: [id number] Arrival date & time: 01/10/22  0093     History  No chief complaint on file.   James Jordan is a 51 y.o. male.  HPI  Patient with medical history of PE on Eliquis presents today due to palpitations.  Palpitations happened twice in the last 3 days.  First time was 3 days ago, he was at work standing.  He felt a fluttering in his chest on the left side.  No associated pain or discomfort, he felt he could not take a deep breath.  Denies any nausea or vomiting.  Resolved without any intervention.  This morning patient was laying down, again he felt fluttering on the left side of his chest that felt like palpitations.  No pain, no nausea vomiting or shortness of breath.  Does not feel like his previous PE.  Denies any missed doses of the Eliquis, no recent surgery or travel.  Asymptomatic currently.   PMH notable for prior PE on Eliquis, no history of hypertension, diabetes, hyperlipidemia, family history of cardiac disease before the age of 45, tobacco use.  Denies any endocrine disorders or thyroid disorders.  Home Medications Prior to Admission medications   Medication Sig Start Date End Date Taking? Authorizing Provider  apixaban (ELIQUIS) 5 MG TABS tablet Take 1 tablet (5 mg total) by mouth 2 (two) times daily. 11/25/21  Yes Wendie Agreste, MD  sildenafil (VIAGRA) 50 MG tablet Take 0.5-1 tablets (25-50 mg total) by mouth daily as needed for erectile dysfunction. 11/30/21  Yes Wendie Agreste, MD      Allergies    Patient has no known allergies.    Review of Systems   Review of Systems  Physical Exam Updated Vital Signs BP 139/84   Pulse 63   Temp 98.3 F (36.8 C) (Oral)   Resp 13   SpO2 100%  Physical Exam Vitals and nursing note reviewed. Exam conducted with a chaperone present.  Constitutional:      Appearance: Normal appearance.  HENT:     Head: Normocephalic and atraumatic.   Eyes:     General: No scleral icterus.       Right eye: No discharge.        Left eye: No discharge.     Extraocular Movements: Extraocular movements intact.     Pupils: Pupils are equal, round, and reactive to light.  Neck:     Comments: No JVD Cardiovascular:     Rate and Rhythm: Normal rate and regular rhythm.     Pulses: Normal pulses.     Heart sounds: Normal heart sounds. No murmur heard.    No friction rub. No gallop.  Pulmonary:     Effort: Pulmonary effort is normal. No respiratory distress.     Breath sounds: Normal breath sounds.  Abdominal:     General: Abdomen is flat. Bowel sounds are normal. There is no distension.     Palpations: Abdomen is soft.     Tenderness: There is no abdominal tenderness.     Hernia: A hernia is present.     Comments: Abdomen soft nontender, soft reproducible umbilical hernia  Musculoskeletal:     Right lower leg: No edema.     Left lower leg: No edema.  Skin:    General: Skin is warm and dry.     Coloration: Skin is not jaundiced.  Neurological:     Mental Status: He is alert. Mental status  is at baseline.     Coordination: Coordination normal.     ED Results / Procedures / Treatments   Labs (all labs ordered are listed, but only abnormal results are displayed) Labs Reviewed  BASIC METABOLIC PANEL - Abnormal; Notable for the following components:      Result Value   Glucose, Bld 115 (*)    All other components within normal limits  CBC  TROPONIN I (HIGH SENSITIVITY)  TROPONIN I (HIGH SENSITIVITY)    EKG EKG Interpretation  Date/Time:  Sunday January 10 2022 11:24:45 EDT Ventricular Rate:  65 PR Interval:  135 QRS Duration: 93 QT Interval:  410 QTC Calculation: 427 R Axis:   63 Text Interpretation: Sinus rhythm normal, no change Confirmed by Charlesetta Shanks [phone]) on 01/10/2022 11:27:09 AM  Radiology DG Chest 2 View  Result Date: 01/10/2022 CLINICAL DATA:  A 51 year old male presents for evaluation of chest pain and  shortness of breath. EXAM: CHEST - 2 VIEW COMPARISON:  Of amber 202022. FINDINGS: Trachea midline. Cardiomediastinal contours and hilar structures are stable. No lobar consolidation.  No sign of pleural effusion. No pneumothorax. On limited assessment no acute skeletal findings. IMPRESSION: No acute cardiopulmonary disease. Electronically Signed   By: Zetta Bills M.D.   On: 01/10/2022 10:08    Procedures Procedures    Medications Ordered in ED Medications - No data to display  ED Course/ Medical Decision Making/ A&P                            Medical Decision Making Amount and/or Complexity of Data Reviewed Labs: ordered. Radiology: ordered.   Patient presents with palpitation.  Differential includes not limited to atypical ACS, pneumonia, PE, electrolyte derangement, metabolic deficiency, and arrhythmia.  Patient vital signs are stable, regular rhythm without any murmurs or auscultations.  Lungs are clear to auscultation bilaterally, no lower extremity edema.  External records reviewed.  Patient's wife is at bedside providing dependent history.  Patient was seen by family medicine last month, history of palpitations.  Wore Zio patch with PACs and PVCs but no arrhythmia detected.  Last cardiac echo 08/26/2021, normal LV RV function.  TSH and mag normal from February of this year.  I ordered and viewed laboratory work-up.  No leukocytosis or anemia, no gross electrolyte derangement or AKI.  Negative delta troponin.  I ordered and viewed plain film of chest.  No cardiomegaly or pneumothorax, no acute process and agree with radiologist dictation.  I ordered and reviewed EKG, sinus rhythm with no ischemic findings.  Not changed compared to previous.  Patient is on cardiac monitoring, I viewed the monitor on reevaluation he is in sinus rhythm with rate of 63.  No recurrence of symptoms, patient resting comfortably.  I considered repeating mag and TSH but given negative February I do not  think warranted.  Considered PE presentation does not seem consistent with that and he is already on Eliquis which would be treatment regardless.  Given negative delta troponin and no ischemic findings on EKG I do not think this is ACS.  Undetected arrhythmias a consideration, if this was A-fib or a flutter he is already on the treatment with Eliquis.   At this point I think patient is stable and appropriate for outpatient follow-up with his cardiologist.  Return precautions discussed with patient and wife, discharged in stable condition.        Final Clinical Impression(s) / ED Diagnoses Final diagnoses:  Palpitation  Rx / DC Orders ED Discharge Orders     None         Sherrill Raring, [place] 01/10/22 1442    Charlesetta Shanks, MD 01/21/22 [phone]

## 2022-01-10 NOTE — Discharge Instructions (Signed)
Your work-up today was reassuringly normal.  Your chest x-ray, heart rhythm and blood work are all reassuringly normal.  Follow-up with your cardiologist next week for reevaluation, if you have loss of consciousness, numbness on one side, severe pain in her chest and posterior back or new or concerning symptoms return back to ED for further evaluation.

## 2022-01-10 NOTE — ED Triage Notes (Signed)
Patient has hx of PE and currently taking blood thinners as prescribed. Reports 3 days of palpitations and exertional SOB. No pain or SOB on arrival. Alert and oriented

## 2022-02-24 ENCOUNTER — Ambulatory Visit: Payer: Managed Care, Other (non HMO) | Admitting: Family Medicine

## 2022-02-24 VITALS — BP 126/82 | HR 74 | Ht 76.0 in | Wt 308.0 lb

## 2022-02-24 DIAGNOSIS — G8929 Other chronic pain: Secondary | ICD-10-CM

## 2022-02-24 DIAGNOSIS — M25561 Pain in right knee: Secondary | ICD-10-CM | POA: Diagnosis not present

## 2022-02-24 NOTE — Patient Instructions (Signed)
Thank you for coming in today.   Check back as needed

## 2022-02-24 NOTE — Progress Notes (Signed)
I, Peterson Lombard, LAT, ATC acting as a scribe for Lynne Leader, MD.  James Jordan is a 51 y.o. male who presents to [facility] at [facility] today for f/u of R knee pain due to a medial tibial plateau fracture.  He was last seen by Dr. Georgina Snell on 12/23/21 and was advised to slow advance activity as tolerated and RTW 8 hour/day on 7/24. Today, pt reports that he is better than he has been as far as the knee   imaging: 11/24/21 R knee MRI             10/01/21 R knee & L-spine XR  Pertinent review of systems: No fevers or chills  Relevant historical information: Pulmonary embolism history.   Exam:  BP 126/82   Pulse 74   Ht '6\' 4"'$  (1.93 m)   Wt (!) 308 lb (139.7 kg)   SpO2 96%   BMI 37.49 kg/m  General: Well Developed, well nourished, and in no acute distress.   MSK: Right knee normal appearing.  Mildly tender palpation medial joint line.    Lab and Radiology Results  EXAM: MRI OF THE RIGHT KNEE WITHOUT CONTRAST   TECHNIQUE: Multiplanar, multisequence MR imaging of the knee was performed. No intravenous contrast was administered.   COMPARISON:  Right knee radiographs 10/01/2021   FINDINGS: MENISCI   Medial meniscus: There is absence of normal meniscal tissue throughout the 10 mm of the posterior horn of the medial meniscus closer to the root (sagittal series 7 images 21 and 22 and coronal series 6, image 11). There is intermediate proton density signal within the junction of the body and posterior horn of the medial meniscus. Mild extrusion of the body of the medial meniscus.   Lateral meniscus: Small oblique undersurface tear within the central third of the mid AP dimension of the body of the lateral meniscus (coronal series 6, image 14).   LIGAMENTS   Cruciates: The ACL and PCL are intact.   Collaterals: Mild edema around the medial collateral ligament compatible with a grade 1 sprain. The fibular collateral ligament, biceps femoris tendon,  iliotibial band, and popliteus tendon are intact.   CARTILAGE   Patellofemoral: Full-thickness cartilage loss within the superior aspect of the patellar apex with mild subchondral edema (sagittal image 17 and axial image 7). High-grade focal thinning of the inferior aspect of the medial patellar facet cartilage (axial image 9) with mild subchondral marrow edema. Moderate trochlear notch and medial trochlear cartilage thinning.   Medial: Diffuse high-grade partial to full-thickness cartilage loss throughout the mid to posteromedial tibial plateau and mid to posterior weight-bearing medial femoral condyle. There is horizontal linear subchondral decreased T1 and decreased T2 signal within the medial and mildly posterior aspect of the weight-bearing medial femoral condyle in a region measuring up to 12 mm in AP dimension and 5 mm in transverse dimension (sagittal image 27 and coronal image 14) with high-grade surrounding marrow edema subchondral insufficiency fracture. No overlying cortical collapse. Within the adjacent posteromedial aspect of the medial tibial plateau there is also horizontal linear decreased T1 and decreased T2 signal measuring up to 9 mm in transverse dimension and 14 mm in AP dimension with high-grade surrounding marrow edema, an additional acute to subacute subchondral insufficiency fracture. No overlying cortical collapse.   Lateral: Moderate thinning of the lateral aspect of the weight-bearing lateral femoral condyle.   Joint: Moderatejoint effusion. Normal Hoffa's fat pad. No plical thickening.   Popliteal Fossa:  No Baker's  cyst.   Extensor Mechanism:  Intact quadriceps tendon and patellar tendon.   Bones:  Please see "medial cartilage" section above.   Other: None.   IMPRESSION: 1. Acute to subacute subchondral insufficiency fractures within the medial posterior aspect of the weight-bearing medial femoral condyle and medial tibial plateau with  high-grade surrounding marrow edema but no overlying cortical collapse. 2. Mild-to-moderate patellofemoral and moderate to high-grade medial compartment cartilage degenerative changes. 3. Absence of normal meniscal tissue within the root of the posterior horn of the medial meniscus, a focal complex tear with mild extrusion of the body of the medial meniscus. 4. Small oblique undersurface tear within the central third of the mid AP dimension of the body of the lateral meniscus. 5. Moderate joint effusion.     Electronically Signed   By: Yvonne Kendall M.D.   On: 11/25/2021 14:16 I, Lynne Leader, personally (independently) visualized and performed the interpretation of the images attached in this note.     Assessment and Plan: 51 y.o. male with improving right knee pain.  Pain was due to an insufficiency fracture.  Resolving or remaining pain now is thought to be due to medial compartment degenerative changes.  Plan to continue off loader brace as needed.  Check back as needed.  Precautions reviewed. Total encounter time 20 minutes including face-to-face time with the patient and, reviewing past medical record, and charting on the date of service.      Discussed warning signs or symptoms. Please see discharge instructions. Patient expresses understanding.   The above documentation has been reviewed and is accurate and complete Lynne Leader, M.D.

## 2022-03-01 HISTORY — PX: EYE SURGERY: SHX253

## 2022-04-02 ENCOUNTER — Encounter: Payer: Self-pay | Admitting: Family Medicine

## 2022-04-02 ENCOUNTER — Inpatient Hospital Stay: Payer: Managed Care, Other (non HMO) | Attending: Hematology and Oncology

## 2022-04-02 ENCOUNTER — Inpatient Hospital Stay: Payer: Managed Care, Other (non HMO) | Admitting: Hematology and Oncology

## 2022-04-02 ENCOUNTER — Encounter: Payer: Self-pay | Admitting: Hematology and Oncology

## 2022-04-02 ENCOUNTER — Other Ambulatory Visit: Payer: Self-pay

## 2022-04-02 VITALS — BP 141/82 | HR 70 | Temp 97.5°F | Resp 18 | Ht 76.0 in | Wt 305.9 lb

## 2022-04-02 DIAGNOSIS — R600 Localized edema: Secondary | ICD-10-CM

## 2022-04-02 DIAGNOSIS — R202 Paresthesia of skin: Secondary | ICD-10-CM | POA: Insufficient documentation

## 2022-04-02 DIAGNOSIS — Z79899 Other long term (current) drug therapy: Secondary | ICD-10-CM | POA: Diagnosis not present

## 2022-04-02 DIAGNOSIS — I82432 Acute embolism and thrombosis of left popliteal vein: Secondary | ICD-10-CM | POA: Diagnosis not present

## 2022-04-02 DIAGNOSIS — I517 Cardiomegaly: Secondary | ICD-10-CM | POA: Diagnosis not present

## 2022-04-02 DIAGNOSIS — M7989 Other specified soft tissue disorders: Secondary | ICD-10-CM | POA: Diagnosis not present

## 2022-04-02 DIAGNOSIS — K760 Fatty (change of) liver, not elsewhere classified: Secondary | ICD-10-CM | POA: Insufficient documentation

## 2022-04-02 DIAGNOSIS — Z7901 Long term (current) use of anticoagulants: Secondary | ICD-10-CM | POA: Diagnosis not present

## 2022-04-02 DIAGNOSIS — I2699 Other pulmonary embolism without acute cor pulmonale: Secondary | ICD-10-CM | POA: Insufficient documentation

## 2022-04-02 DIAGNOSIS — E669 Obesity, unspecified: Secondary | ICD-10-CM

## 2022-04-02 LAB — BASIC METABOLIC PANEL - CANCER CENTER ONLY
Anion gap: 5 (ref 5–15)
BUN: 17 mg/dL (ref 6–20)
CO2: 29 mmol/L (ref 22–32)
Calcium: 9.5 mg/dL (ref 8.9–10.3)
Chloride: 105 mmol/L (ref 98–111)
Creatinine: 1.13 mg/dL (ref 0.61–1.24)
GFR, Estimated: >60 mL/min (ref >60)
Glucose, Bld: 116 mg/dL — ABNORMAL HIGH (ref 70–99)
Potassium: 4.6 mmol/L (ref 3.5–5.1)
Sodium: 139 mmol/L (ref 135–145)

## 2022-04-02 LAB — CBC WITH DIFFERENTIAL (CANCER CENTER ONLY)
Abs Immature Granulocytes: 0.03 10*3/uL (ref 0.00–0.07)
Basophils Absolute: 0 10*3/uL (ref 0.0–0.1)
Basophils Relative: 0 %
Eosinophils Absolute: 0.2 10*3/uL (ref 0.0–0.5)
Eosinophils Relative: 3 %
HCT: 48.7 % (ref 39.0–52.0)
Hemoglobin: 16.2 g/dL (ref 13.0–17.0)
Immature Granulocytes: 0 %
Lymphocytes Relative: 24 %
Lymphs Abs: 1.6 10*3/uL (ref 0.7–4.0)
MCH: 30.2 pg (ref 26.0–34.0)
MCHC: 33.3 g/dL (ref 30.0–36.0)
MCV: 90.7 fL (ref 80.0–100.0)
Monocytes Absolute: 0.5 10*3/uL (ref 0.1–1.0)
Monocytes Relative: 7 %
Neutro Abs: 4.4 10*3/uL (ref 1.7–7.7)
Neutrophils Relative %: 66 %
Platelet Count: 273 10*3/uL (ref 150–400)
RBC: 5.37 MIL/uL (ref 4.22–5.81)
RDW: 12.7 % (ref 11.5–15.5)
WBC Count: 6.7 10*3/uL (ref 4.0–10.5)
nRBC: 0 % (ref 0.0–0.2)

## 2022-04-02 LAB — D-DIMER, QUANTITATIVE: D-Dimer, Quant: 0.36 ug/mL-FEU (ref 0.00–0.50)

## 2022-04-02 MED ORDER — APIXABAN 2.5 MG PO TABS: 2.5000 mg | ORAL_TABLET | Freq: Two times a day (BID) | ORAL | 3 refills | Status: DC

## 2022-04-02 NOTE — Assessment & Plan Note (Signed)
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

## 2022-04-02 NOTE — Assessment & Plan Note (Signed)
He has completed a years worth of anticoagulation therapy We discussed the risk and benefits of discontinuation of Eliquis  I explain to the patient the rational to consider extended duration of treatment with low dose Apixaban. 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 [place], M.D., for the [employer] Investigators* [employer] [facility] 2013; 368:699-708February 21, 2013DOI: 10.1056/[id number]  Methods In this randomized, double-blind study, we compared two doses of apixaban (2.5 mg and 5 mg, twice daily) with placebo in patients with venous thromboembolism who had completed 6 to 12 months of anticoagulation therapy and for whom there was clinical equipoise regarding the continuation or cessation of anticoagulation therapy. The study drugs were administered for 12 months  Results A total of 2486 patients underwent randomization, of whom 2482 were included in the intention-to-treat analyses. Symptomatic recurrent venous thromboembolism or death from venous thromboembolism occurred in 73 of the 829 patients (8.8%) who were receiving placebo, as compared with 14 of the 840 patients (1.7%) who were receiving 2.5 mg of apixaban (a difference of 7.2 percentage points; 95% confidence interval [CI], 5.0 to 9.3) and 14 of the 813 patients (1.7%) who were receiving 5 mg of apixaban (a difference of 7.0 percentage points; 95% CI, 4.9 to 9.1) (P<0.001 for both comparisons). The rates of major bleeding were 0.5% in the placebo group, 0.2% in the 2.5-mg apixaban group, and 0.1% in the 5-mg apixaban group. The rates of clinically relevant nonmajor bleeding were 2.3% in the placebo group, 3.0% in the 2.5-mg apixaban group, and 4.2% in the 5-mg  apixaban group. The rate of death from any cause was 1.7% in the placebo group, as compared with 0.8% in the 2.5-mg apixaban group and 0.5% in the 5-mg apixaban group.  Conclusions Extended anticoagulation with apixaban at either a treatment dose (5 mg) or a thromboprophylactic dose (2.5 mg) reduced the risk of recurrent venous thromboembolism without increasing the rate of major bleeding.  Ultimately, he is in agreement for reduced dose Eliquis in the future I plan to see him once a year There is no contraindication for him to proceed with surgical intervention for hernia repair if needed in the future

## 2022-04-02 NOTE — Progress Notes (Signed)
[facility] OFFICE PROGRESS NOTE  Wendie Agreste, MD  ASSESSMENT & PLAN:  Acute deep vein thrombosis (DVT) of popliteal vein of left lower extremity ([place]) He has completed a years worth of anticoagulation therapy We discussed the risk and benefits of discontinuation of Eliquis  I explain to the patient the rational to consider extended duration of treatment with low dose Apixaban. 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 [place], M.D., for the [employer] Investigators* [employer] [facility] 2013; 368:699-708February 21, 2013DOI: 10.1056/[id number]  Methods In this randomized, double-blind study, we compared two doses of apixaban (2.5 mg and 5 mg, twice daily) with placebo in patients with venous thromboembolism who had completed 6 to 12 months of anticoagulation therapy and for whom there was clinical equipoise regarding the continuation or cessation of anticoagulation therapy. The study drugs were administered for 12 months  Results A total of 2486 patients underwent randomization, of whom 2482 were included in the intention-to-treat analyses. Symptomatic recurrent venous thromboembolism or death from venous thromboembolism occurred in 73 of the 829 patients (8.8%) who were receiving placebo, as compared with 14 of the 840 patients (1.7%) who were receiving 2.5 mg of apixaban (a difference of 7.2 percentage points; 95% confidence interval [CI], 5.0 to 9.3) and 14 of the 813 patients (1.7%) who were receiving 5 mg of apixaban (a difference of 7.0 percentage points; 95% CI, 4.9 to 9.1) (P<0.001 for both comparisons). The rates of major bleeding were 0.5% in the placebo group, 0.2% in the 2.5-mg apixaban  group, and 0.1% in the 5-mg apixaban group. The rates of clinically relevant nonmajor bleeding were 2.3% in the placebo group, 3.0% in the 2.5-mg apixaban group, and 4.2% in the 5-mg apixaban group. The rate of death from any cause was 1.7% in the placebo group, as compared with 0.8% in the 2.5-mg apixaban group and 0.5% in the 5-mg apixaban group.  Conclusions Extended anticoagulation with apixaban at either a treatment dose (5 mg) or a thromboprophylactic dose (2.5 mg) reduced the risk of recurrent venous thromboembolism without increasing the rate of major bleeding.  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 The patient knows to call the clinic with any problems, questions or concerns. No barriers to learning was detected.    Heath Lark, MD 10/20/202310:23 AM  INTERVAL HISTORY: James Jordan 51 y.o. male returns for further follow-up for history  of DVT/PE He is doing well on anticoagulation therapy He is able to drink approximately 90 to 100 ounces of liquid since last time I saw him He complained of bilateral lower extremity edema on a regular basis The patient denies any recent signs or  symptoms of bleeding such as spontaneous epistaxis, hematuria or hematochezia.   SUMMARY OF HEMATOLOGIC HISTORY:  James Jordan 51 y.o. male was seen in December 2022 due to history of DVT/PE  the patient had traveled to the beach several months ago and has been complaining of intermittent left lower extremity achiness He has intermittent leg swelling, tenderness but denies trauma to the left lower extremity Previously, he had a very stressful job but over the past 2 months, he had moved to a different role.   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     LEFT:  - Findings consistent with acute deep vein thrombosis involving the left popliteal vein.  - Findings consistent with age indeterminate deep vein thrombosis involving the left femoral vein, left peroneal veins, and left posterior tibial veins.   CT angiogram showed 1. Large volume of pulmonary emboli in the lungs bilaterally, with dilatation of the right atrium and right ventricle (RV to LV ratio of 1.1) indicative of elevated right-sided heart pressures and right heart strain. These findings have been shown to be associated with a increased morbidity and mortality in the setting pulmonary embolism. 2. No evidence of pulmonary infarct or significant pleural effusion. 3. Hepatic steatosis.  He was admitted and appropriately anticoagulated and was discharged on Eliquis He is compliant taking Eliquis as directed Recently, he felt some weird tingling sensation and he presented to the emergency department for evaluation  On May 28, 2021, repeat venous Doppler ultrasound showed  RIGHT:  - No evidence of common femoral vein obstruction.     LEFT:  - Findings consistent with age indeterminate  deep vein thrombosis involving the left popliteal vein, left posterior tibial veins, and left peroneal veins.  - Findings appear essentially unchanged compared to previous examination  Repeat CT imaging of the chest, abdomen and pelvis showed 1. Sequela of chronic pulmonary emboli, with thin residual synechia within the bilateral lower lobe pulmonary arteries. The significant majority of the pulmonary emboli seen previously have resolved in the interim. 2. No acute pulmonary embolus. 3. Hepatic steatosis. 4. Colonic diverticulosis without diverticulitis. 5. Fat containing umbilical and right inguinal hernias. No bowel herniation.   He had prior surgeries before and never had perioperative thromboembolic events. The patient had never been on testosterone replacement therapy  There is no family history of blood clots or miscarriages. He denies bleeding complications from anticoagulation therapy  I have reviewed the past medical history, past surgical history, social history and family history with the patient and they are unchanged from previous note.  ALLERGIES:  has No Known Allergies.  MEDICATIONS:  Current Outpatient Medications  Medication Sig Dispense Refill   apixaban (ELIQUIS) 2.5 MG TABS tablet Take 1 tablet (2.5 mg total) by mouth 2 (two) times daily. 180 tablet 3   sildenafil (VIAGRA) 50 MG tablet Take 0.5-1 tablets (25-50 mg total) by mouth daily as needed for erectile dysfunction. 10 tablet 6   No current facility-administered medications for this visit.  REVIEW OF SYSTEMS:   Constitutional: Denies fevers, chills or night sweats Eyes: Denies blurriness of vision Ears, nose, mouth, throat, and face: Denies mucositis or sore throat Respiratory: Denies cough, dyspnea or wheezes Cardiovascular: Denies palpitation, chest discomfort  Gastrointestinal:  Denies nausea, heartburn or change in bowel habits Skin: Denies abnormal skin rashes Lymphatics: Denies new  lymphadenopathy or easy bruising Neurological:Denies numbness, tingling or new weaknesses Behavioral/Psych: Mood is stable, no new changes  All other systems were reviewed with the patient and are negative.  PHYSICAL EXAMINATION: ECOG PERFORMANCE STATUS: 1 - Symptomatic but completely ambulatory  Vitals:   04/02/22 0833  BP: (!) 141/82  Pulse: 70  Resp: 18  Temp: (!) 97.5 F (36.4 C)  SpO2: 100%   Filed Weights   04/02/22 0833  Weight: (!) 305 lb 14.4 oz (138.8 kg)    GENERAL:alert, no distress and comfortable HEART: Noted mild to moderate bilateral lower extremity edema NEURO: alert & oriented x 3 with fluent speech, no focal motor/sensory deficits  LABORATORY DATA:  I have reviewed the data as listed     Component Value Date/Time   NA 139 04/02/2022 0751   K 4.6 04/02/2022 0751   CL 105 04/02/2022 0751   CO2 29 04/02/2022 0751   GLUCOSE 116 (H) 04/02/2022 0751   BUN 17 04/02/2022 0751   CREATININE 1.13 04/02/2022 0751   CREATININE 1.08 04/01/2016 1246   CALCIUM 9.5 04/02/2022 0751   PROT 6.3 (L) 05/03/2021 1841   ALBUMIN 3.7 05/03/2021 1841   AST 19 05/03/2021 1841   ALT 20 05/03/2021 1841   ALKPHOS 50 05/03/2021 1841   BILITOT 0.6 05/03/2021 1841   GFRNONAA >60 04/02/2022 0751   GFRAA >60 10/19/2018 2128    No results found for: "SPEP", "UPEP"  Lab Results  Component Value Date   WBC 6.7 04/02/2022   NEUTROABS 4.4 04/02/2022   HGB 16.2 04/02/2022   HCT 48.7 04/02/2022   MCV 90.7 04/02/2022   PLT 273 04/02/2022      Chemistry      Component Value Date/Time   NA 139 04/02/2022 0751   K 4.6 04/02/2022 0751   CL 105 04/02/2022 0751   CO2 29 04/02/2022 0751   BUN 17 04/02/2022 0751   CREATININE 1.13 04/02/2022 0751   CREATININE 1.08 04/01/2016 1246      Component Value Date/Time   CALCIUM 9.5 04/02/2022 0751   ALKPHOS 50 05/03/2021 1841   AST 19 05/03/2021 1841   ALT 20 05/03/2021 1841   BILITOT 0.6 05/03/2021 1841

## 2022-04-05 ENCOUNTER — Encounter: Payer: Self-pay | Admitting: Family Medicine

## 2022-04-05 ENCOUNTER — Ambulatory Visit: Payer: Managed Care, Other (non HMO) | Admitting: Family Medicine

## 2022-04-05 VITALS — BP 130/78 | HR 67 | Temp 97.9°F | Ht 76.0 in | Wt 311.2 lb

## 2022-04-05 DIAGNOSIS — K4090 Unilateral inguinal hernia, without obstruction or gangrene, not specified as recurrent: Secondary | ICD-10-CM | POA: Diagnosis not present

## 2022-04-05 DIAGNOSIS — K429 Umbilical hernia without obstruction or gangrene: Secondary | ICD-10-CM

## 2022-04-05 DIAGNOSIS — J309 Allergic rhinitis, unspecified: Secondary | ICD-10-CM

## 2022-04-05 DIAGNOSIS — H938X3 Other specified disorders of ear, bilateral: Secondary | ICD-10-CM | POA: Diagnosis not present

## 2022-04-05 NOTE — Progress Notes (Signed)
Subjective:  Patient ID: James Jordan, male    DOB: 24-Nov-1970  Age: 51 y.o. MRN: [id number]  CC:  Chief Complaint  Patient presents with   Ear Fullness    Pt states he feels fullness in his ear , pt is going back Thursday to finish his cataract surgery   Referral for hernia    Pt wants a referral for the 2 hernias he has     HPI James Jordan presents for   Ear fulness Off and on for years. Better after peroxide at times. Not blocked. No pain.  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 Still foggy at times. Cataract surgery 9/19 of this year, follow up with optho- Dr. Wallis Mart.  Nasal congestion, runny nose in am. Some runny nose, sneezing at work.   Hernia: Hx of DVT, PE - Eliquis rx for 1 year for PE, Dr. Alvy Bimler. Appt 10/20 - decreased dose to '5mg'$  BID, then 2.'5mg'$  BID. No contraindication to surgery at this time with option to stop meds for a few days.  Held off on hernia repair d/t use of anticoagulation. Umbilical hernia sore and firm at times. Sore in R groin with sneezing. Trouble pushing in umbilical hernia but no redness, no bowel changes.  Initially discussed in 07/2021 - easily reducible at that time.    History Patient Active Problem List   Diagnosis Date Noted   Bilateral lower extremity edema 06/02/2021   Bilateral pulmonary embolism ([place]) 04/05/2021   Acute deep vein thrombosis (DVT) of popliteal vein of left lower extremity ([facility])    PAC (premature atrial contraction) 06/01/2016   Obesity, Class II, BMI 35-39.9 04/01/2016   Past Medical History:  Diagnosis Date   ACL tear - left knee 2002   Per patient, not repaired    Umbilical hernia 2440   Past Surgical History:  Procedure Laterality Date   HERNIA REPAIR  1027   Umbilical   No Known Allergies Prior to Admission medications   Medication Sig Start Date End Date Taking? Authorizing Provider  apixaban (ELIQUIS) 2.5 MG TABS tablet Take 1 tablet (2.5 mg total) by  mouth 2 (two) times daily. 04/02/22  Yes Gorsuch, Ni, MD  sildenafil (VIAGRA) 50 MG tablet Take 0.5-1 tablets (25-50 mg total) by mouth daily as needed for erectile dysfunction. 11/30/21  Yes Wendie Agreste, MD   Social History   Socioeconomic History   Marital status: Married    Spouse name: Not on file   Number of children: Not on file   Years of education: Not on file   Highest education level: Not on file  Occupational History   Occupation: warehouse  Tobacco Use   Smoking status: Never    Passive exposure: Never   Smokeless tobacco: Current    Types: Snuff  Substance and Sexual Activity   Alcohol use: No   Drug use: No   Sexual activity: Yes  Other Topics Concern   Not on file  Social History Narrative   Lives with his wife.   Social Determinants of Health   Financial Resource Strain: Not on file  Food Insecurity: Not on file  Transportation Needs: Not on file  Physical Activity: Not on file  Stress: Not on file  Social Connections: Not on file  Intimate Partner Violence: Not on file    Review of Systems  Per HPI.  Objective:   Vitals:   04/05/22 1540  BP: 130/78  Pulse:  67  Temp: 97.9 F (36.6 C)  SpO2: 97%  Weight: (!) 311 lb 3.2 oz (141.2 kg)  Height: '6\' 4"'$  (1.93 m)     Physical Exam Constitutional:      General: He is not in acute distress.    Appearance: Normal appearance. He is well-developed.  HENT:     Head: Normocephalic and atraumatic.     Right Ear: Tympanic membrane, ear canal and external ear normal.     Left Ear: Tympanic membrane, ear canal and external ear normal.     Nose: No congestion.  Cardiovascular:     Rate and Rhythm: Normal rate.  Pulmonary:     Effort: Pulmonary effort is normal.  Abdominal:     Hernia: A hernia is present. Hernia is present in the umbilical area and right inguinal area.     Comments: Large, easily reducible umbilical and right inguinal hernias.  No apparent skin changes or significant discomfort.   Neurological:     Mental Status: He is alert and oriented to person, place, and time.  Psychiatric:        Mood and Affect: Mood normal.        Assessment & Plan:  James Jordan is a 51 y.o. male . Ear fullness, bilateral Allergic rhinitis, unspecified seasonality, unspecified trigger  -Canals clear, possible eustachian tube dysfunction with underlying allergic rhinitis.  Trial of Flonase nasal spray over-the-counter or Claritin, RTC precautions or option of meeting with ENT.  Umbilical hernia without obstruction and without gangrene - Plan: Ambulatory referral to General Surgery Right inguinal hernia - Plan: Ambulatory referral to General Surgery  -Persistent symptoms, symptomatic at times as above.  Easily reducible in office.  Now able to proceed with surgery as he has completed a year of anticoagulation since PE.  On low-dose currently, will refer to general surgery and can discuss temporary hold of anticoagulation with hematology or me if needed.  No orders of the defined types were placed in this encounter.  Patient Instructions  Claritin or flonase for possible allergy cause of ear fullness.  If that is not helping the ear fullness, let me know and I am happy to refer you to ear nose and throat specialist.  I will refer you to surgeon to discuss hernia repair.  If they need information regarding holding the anticoagulation, can reach out to Dr. Alvy Bimler or me if needed.  Thanks for coming in today and take care.       Signed,   Merri Ray, MD [facility] 04/05/22 5:39 PM

## 2022-04-05 NOTE — Patient Instructions (Addendum)
Claritin or flonase for possible allergy cause of ear fullness.  If that is not helping the ear fullness, let me know and I am happy to refer you to ear nose and throat specialist.  I will refer you to surgeon to discuss hernia repair.  If they need information regarding holding the anticoagulation, can reach out to Dr. Alvy Bimler or me if needed.  Thanks for coming in today and take care.

## 2022-05-28 ENCOUNTER — Ambulatory Visit: Payer: Self-pay | Admitting: Surgery

## 2022-05-28 NOTE — Progress Notes (Signed)
Sent message, via epic in basket, requesting orders in epic from surgeon.  

## 2022-06-02 ENCOUNTER — Encounter: Payer: Managed Care, Other (non HMO) | Admitting: Family Medicine

## 2022-06-03 NOTE — Patient Instructions (Addendum)
SURGICAL WAITING ROOM VISITATION Patients having surgery or a procedure may have no more than 2 support people in the waiting area - these visitors may rotate in the visitor waiting room.   Due to an increase in RSV and influenza rates and associated hospitalizations, children ages 25 and under may not visit patients in [place]. If the patient needs to stay at the hospital during part of their recovery, the visitor guidelines for inpatient rooms apply.  PRE-OP VISITATION  Pre-op nurse will coordinate an appropriate time for 1 support person to accompany the patient in pre-op.  This support person may not rotate.  This visitor will be contacted when the time is appropriate for the visitor to come back in the pre-op area.  Please refer to the [facility] website for the visitor guidelines for Inpatients (after your surgery is over and you are in a regular room).  You are not required to quarantine at this time prior to your surgery. However, you must do this: Hand Hygiene often Do NOT share personal items Notify your provider if you are in close contact with someone who has COVID or you develop fever 100.4 or greater, new onset of sneezing, cough, sore throat, shortness of breath or body aches.  If you test positive for Covid or have been in contact with anyone that has tested positive in the last 10 days please notify you surgeon.    Your procedure is scheduled on: Friday June 18, 2022   Report to [facility] Main Entrance: [place] entrance where the [employer] is available.   Report to admitting at:05:15 AM  +++++Call this number if you have any questions or problems the morning of surgery [phone]  Do not eat food after Midnight the night prior to your surgery/procedure.  After Midnight you may have the following liquids until 04:30 AM DAY OF SURGERY  Clear Liquid Diet Water Black Coffee (sugar ok, NO MILK/CREAM OR CREAMERS)  Tea (sugar ok, NO  MILK/CREAM OR CREAMERS) regular and decaf                             Plain Jell-O  with no fruit (NO RED)                                           Fruit ices (not with fruit pulp, NO RED)                                     Popsicles (NO RED)                                                                  Juice: apple, WHITE grape, WHITE cranberry Sports drinks like Gatorade or Powerade (NO RED)                FOLLOW ANY ADDITIONAL PRE OP INSTRUCTIONS YOU RECEIVED FROM YOUR SURGEON'S OFFICE!!!   Oral Hygiene is also important to reduce your risk of infection.        Remember -  BRUSH YOUR TEETH THE MORNING OF SURGERY WITH YOUR REGULAR TOOTHPASTE  These are anesthesia recommendations for holding your anticoagulants.  Please contact your prescribing physician to confirm IF it is safe to hold your anticoagulants for this length of time.  Eliquis Apixaban   72 hours     Take ONLY these medicines the morning of surgery with A SIP OF WATER: None  You may not have any metal on your body including jewelry, and body piercing  Do not wear  lotions, powders, cologne, or deodorant  Men may shave face and neck.  Contacts, Hearing Aids, dentures or bridgework may not be worn into surgery. DENTURES WILL BE REMOVED PRIOR TO SURGERY PLEASE DO NOT APPLY "Poly grip" OR ADHESIVES!!!  You may bring a small overnight bag with you on the day of surgery, only pack items that are not valuable. [facility] IS NOT RESPONSIBLE   FOR VALUABLES THAT ARE LOST OR STOLEN.   Do not bring your home medications to the hospital. The Pharmacy will dispense medications listed on your medication list to you during your admission in the Hospital.  Please read over the following fact sheets you were given: IF YOU HAVE QUESTIONS ABOUT YOUR PRE-OP INSTRUCTIONS, PLEASE CALL [phone]  ([place])   [facility] - Preparing for Surgery Before surgery, you can play an important role.  Because skin is not sterile, your skin  needs to be as free of germs as possible.  You can reduce the number of germs on your skin by washing with CHG (chlorahexidine gluconate) soap before surgery.  CHG is an antiseptic cleaner which kills germs and bonds with the skin to continue killing germs even after washing. Please DO NOT use if you have an allergy to CHG or antibacterial soaps.  If your skin becomes reddened/irritated stop using the CHG and inform your nurse when you arrive at Short Stay. Do not shave (including legs and underarms) for at least 48 hours prior to the first CHG shower.  You may shave your face/neck.  Please follow these instructions carefully:  1.  Shower with CHG Soap the night before surgery and the  morning of surgery.  2.  If you choose to wash your hair, wash your hair first as usual with your normal  shampoo.  3.  After you shampoo, rinse your hair and body thoroughly to remove the shampoo.                             4.  Use CHG as you would any other liquid soap.  You can apply chg directly to the skin and wash.  Gently with a scrungie or clean washcloth.  5.  Apply the CHG Soap to your body ONLY FROM THE NECK DOWN.   Do not use on face/ open                           Wound or open sores. Avoid contact with eyes, ears mouth and genitals (private parts).                       Wash face,  Genitals (private parts) with your normal soap.             6.  Wash thoroughly, paying special attention to the area where your  surgery  will be performed.  7.  Thoroughly rinse your body with warm  water from the neck down.  8.  DO NOT shower/wash with your normal soap after using and rinsing off the CHG Soap.            9.  Pat yourself dry with a clean towel.            10.  Wear clean pajamas.            11.  Place clean sheets on your bed the night of your first shower and do not  sleep with pets.  ON THE DAY OF SURGERY : Do not apply any lotions/deodorants the morning of surgery.  Please wear clean clothes to the  hospital/surgery center.    FAILURE TO FOLLOW THESE INSTRUCTIONS MAY RESULT IN THE CANCELLATION OF YOUR SURGERY  PATIENT SIGNATURE_________________________________  NURSE SIGNATURE__________________________________  ________________________________________________________________________

## 2022-06-03 NOTE — Progress Notes (Addendum)
COVID Vaccine received:  _0  No _1  Yes Date of any COVID positive Test in last 90 days: none  PCP - Merri Ray, MD   LOV  04-05-2022 Cardiologist - Adrian Prows, MD  [place] 08-13-2021 Hematology- Heath Lark, MD  LOV 04-02-2022  Chest x-ray - 01-10-2022 EKG -  01-11-2022 Stress Test -  ECHO - 08-26-2021 Cardiac Cath -  Long Term monitor- 07-30-2021  PCR screen: _2  Ordered & Completed                      _3   No Order but Needs PROFEND                      _4   N/A for this surgery  Surgery Plan:  _5  Ambulatory                            _6  Outpatient in bed                            _7  Admit  Anesthesia:    _8  General  _9  Spinal                           _10   Choice _11   MAC  Bowel Prep - _12  No  _13   Yes _____________  Pacemaker / ICD device _14  No _15  Yes        Device order form faxed _16  No    _17   Yes      Faxed to:  Spinal Cord Stimulator:_18  No _19  Yes      (Remind patient to bring remote DOS) Other Implants:   History of Sleep Apnea? _20  No _21  Yes   CPAP used?- _22  No _23  Yes    Does the patient monitor blood sugar? _24  No _25  Yes  _26  N/A  Blood Thinner / Instructions: Eliquis      Mr. Marbach thinks it's 48 hrs hold but will contact Dr. Alvy Bimler or Dr. Thermon Leyland for clarification. I told him that sometimes it is up a 72 hour hold and is important to contact his doctor. He voiced understanding and said he was going to sen Dr. Alvy Bimler a MyChart message today.  Aspirin Instructions: None  ERAS Protocol Ordered: _27  No  _28  Yes PRE-SURGERY _29  ENSURE  _30  G2  _31  No Drink Ordered Patient is to be NPO after: 04:30am  Comments: Consent only says Umbilical hernia repair w/ mesh, Surgery Booked as that plus Right inguinal hernia repair also.   I called [facility] and talked to Triage nurse who was going to ask Dr. Thermon Leyland to add the inguinal Hernia to consent order.  This was not completed prior to the PST appt so the patient will sign the corrected consent on DOS.    Activity level: Patient can climb a flight of stairs without difficulty; _32  No CP  _33  No SOB.  Patient can perform ADLs without assistance.   Anesthesia review: HTN, Hx DVT /PE, palps, Hx MALT lymphoma right eye.  Patient denies shortness of breath, fever, cough and chest pain at PAT appointment.  Patient verbalized understanding and agreement to the Pre-Surgical Instructions that were given to them at this PAT appointment. Patient was also educated of the need to review these PAT instructions again prior to his/her surgery.I reviewed the appropriate phone numbers to call if they have  any and questions or concerns.

## 2022-06-04 ENCOUNTER — Ambulatory Visit: Payer: Self-pay | Admitting: Surgery

## 2022-06-04 ENCOUNTER — Other Ambulatory Visit: Payer: Self-pay

## 2022-06-04 ENCOUNTER — Encounter (HOSPITAL_COMMUNITY)
Admission: RE | Admit: 2022-06-04 | Discharge: 2022-06-04 | Disposition: A | Discharge status: Home / Self Care | Payer: Managed Care, Other (non HMO) | Source: Ambulatory Visit | Attending: Surgery | Admitting: Surgery

## 2022-06-04 ENCOUNTER — Encounter (HOSPITAL_COMMUNITY): Payer: Self-pay

## 2022-06-04 ENCOUNTER — Encounter: Payer: Self-pay | Admitting: Hematology and Oncology

## 2022-06-04 VITALS — BP 139/82 | HR 64 | Temp 98.8°F | Resp 16 | Ht 76.0 in | Wt 304.0 lb

## 2022-06-04 DIAGNOSIS — I10 Essential (primary) hypertension: Secondary | ICD-10-CM | POA: Diagnosis not present

## 2022-06-04 DIAGNOSIS — Z01812 Encounter for preprocedural laboratory examination: Secondary | ICD-10-CM | POA: Insufficient documentation

## 2022-06-04 HISTORY — DX: Malignant (primary) neoplasm, unspecified: C80.1

## 2022-06-04 HISTORY — DX: Other complications of anesthesia, initial encounter: T88.59XA

## 2022-06-04 HISTORY — DX: Personal history of pulmonary embolism: Z86.711

## 2022-06-04 HISTORY — DX: Essential (primary) hypertension: I10

## 2022-06-04 HISTORY — DX: Acute embolism and thrombosis of unspecified deep veins of unspecified lower extremity: I82.409

## 2022-06-04 LAB — BASIC METABOLIC PANEL
Anion gap: 9 (ref 5–15)
BUN: 15 mg/dL (ref 6–20)
CO2: 26 mmol/L (ref 22–32)
Calcium: 8.9 mg/dL (ref 8.9–10.3)
Chloride: 103 mmol/L (ref 98–111)
Creatinine, Ser: 0.96 mg/dL (ref 0.61–1.24)
GFR, Estimated: >60 mL/min (ref >60)
Glucose, Bld: 93 mg/dL (ref 70–99)
Potassium: 4.3 mmol/L (ref 3.5–5.1)
Sodium: 138 mmol/L (ref 135–145)

## 2022-06-04 LAB — CBC
HCT: 46.7 % (ref 39.0–52.0)
Hemoglobin: 15.2 g/dL (ref 13.0–17.0)
MCH: 30.3 pg (ref 26.0–34.0)
MCHC: 32.5 g/dL (ref 30.0–36.0)
MCV: 93.2 fL (ref 80.0–100.0)
Platelets: 278 10*3/uL (ref 150–400)
RBC: 5.01 MIL/uL (ref 4.22–5.81)
RDW: 13.4 % (ref 11.5–15.5)
WBC: 8.3 10*3/uL (ref 4.0–10.5)
nRBC: 0 % (ref 0.0–0.2)

## 2022-06-17 NOTE — Anesthesia Preprocedure Evaluation (Addendum)
Anesthesia Evaluation  Patient identified by MRN, date of birth, ID band Patient awake    Reviewed: Allergy & Precautions, NPO status , Patient's Chart, lab work & pertinent test results  Airway Mallampati: II  TM Distance: >3 FB Neck ROM: Full    Dental no notable dental hx. (+) Implants, Dental Advisory Given   Pulmonary PE (2022)   Pulmonary exam normal breath sounds clear to auscultation       Cardiovascular hypertension, + DVT (on Eliquis)  Normal cardiovascular exam Rhythm:Regular Rate:Normal  08/26/2021 Echo 1. Left ventricular ejection fraction, by estimation, is 55 to 60%. The  left ventricle has normal function. The left ventricle has no regional  wall motion abnormalities. Left ventricular diastolic parameters were  normal. The average left ventricular  global longitudinal strain is -24.5 %. The global longitudinal strain is  normal.   2. Right ventricular systolic function is normal. The right ventricular  size is normal.   3. The mitral valve is normal in structure. Trivial mitral valve  regurgitation. No evidence of mitral stenosis.   4. The aortic valve is tricuspid. Aortic valve regurgitation is not  visualized. No aortic stenosis is present.   5. The inferior vena cava is normal in size with greater than 50%  respiratory variability, suggesting right atrial pressure of 3 mmHg.      Neuro/Psych    GI/Hepatic Neg liver ROS,,,  Endo/Other  negative endocrine ROS    Renal/GU Lab Results      Component                Value               Date                      CREATININE               0.96                06/04/2022                BUN                      15                  06/04/2022                NA                       138                 06/04/2022                K                        4.3                 06/04/2022                CL                       103                 06/04/2022                 CO2  26                  06/04/2022                Musculoskeletal   Abdominal   Peds  Hematology Lab Results      Component                Value               Date                      WBC                      8.3                 06/04/2022                HGB                      15.2                06/04/2022                HCT                      46.7                06/04/2022                MCV                      93.2                06/04/2022                PLT                      278                 06/04/2022              Anesthesia Other Findings   Reproductive/Obstetrics                              Anesthesia Physical Anesthesia Plan  ASA: 3  Anesthesia Plan: General   Post-op Pain Management: Ketamine IV* and Tylenol PO (pre-op)*   Induction: Intravenous  PONV Risk Score and Plan: 3 and Treatment may vary due to age or medical condition, Ondansetron, Dexamethasone and Midazolam  Airway Management Planned: Oral ETT  Additional Equipment: None  Intra-op Plan:   Post-operative Plan: Extubation in OR  Informed Consent: I have reviewed the patients History and Physical, chart, labs and discussed the procedure including the risks, benefits and alternatives for the proposed anesthesia with the patient or authorized representative who has indicated his/her understanding and acceptance.     Dental advisory given  Plan Discussed with: CRNA, Anesthesiologist and Surgeon  Anesthesia Plan Comments:          Anesthesia Quick Evaluation

## 2022-06-18 ENCOUNTER — Encounter (HOSPITAL_COMMUNITY): Payer: Self-pay | Admitting: Surgery

## 2022-06-18 ENCOUNTER — Other Ambulatory Visit: Payer: Self-pay

## 2022-06-18 ENCOUNTER — Ambulatory Visit (HOSPITAL_BASED_OUTPATIENT_CLINIC_OR_DEPARTMENT_OTHER): Payer: Managed Care, Other (non HMO) | Admitting: Anesthesiology

## 2022-06-18 ENCOUNTER — Ambulatory Visit (HOSPITAL_COMMUNITY): Payer: Managed Care, Other (non HMO) | Admitting: Anesthesiology

## 2022-06-18 ENCOUNTER — Ambulatory Visit (HOSPITAL_COMMUNITY)
Admission: RE | Admit: 2022-06-18 | Discharge: 2022-06-18 | Disposition: A | Discharge status: Home / Self Care | Payer: Managed Care, Other (non HMO) | Source: Ambulatory Visit | Attending: Surgery | Admitting: Surgery

## 2022-06-18 ENCOUNTER — Encounter (HOSPITAL_COMMUNITY)
Admission: RE | Disposition: A | Discharge status: Home / Self Care | Payer: Self-pay | Source: Ambulatory Visit | Attending: Surgery

## 2022-06-18 DIAGNOSIS — Z7901 Long term (current) use of anticoagulants: Secondary | ICD-10-CM | POA: Diagnosis not present

## 2022-06-18 DIAGNOSIS — I10 Essential (primary) hypertension: Secondary | ICD-10-CM | POA: Diagnosis not present

## 2022-06-18 DIAGNOSIS — Z86718 Personal history of other venous thrombosis and embolism: Secondary | ICD-10-CM | POA: Insufficient documentation

## 2022-06-18 DIAGNOSIS — K429 Umbilical hernia without obstruction or gangrene: Secondary | ICD-10-CM | POA: Diagnosis present

## 2022-06-18 DIAGNOSIS — I2699 Other pulmonary embolism without acute cor pulmonale: Secondary | ICD-10-CM | POA: Diagnosis not present

## 2022-06-18 DIAGNOSIS — K4020 Bilateral inguinal hernia, without obstruction or gangrene, not specified as recurrent: Secondary | ICD-10-CM | POA: Diagnosis not present

## 2022-06-18 DIAGNOSIS — Z86711 Personal history of pulmonary embolism: Secondary | ICD-10-CM | POA: Diagnosis not present

## 2022-06-18 SURGERY — REPAIR, HERNIA, UMBILICAL, ROBOT-ASSISTED
Anesthesia: General | Laterality: Right

## 2022-06-18 MED ORDER — SUGAMMADEX SODIUM 200 MG/2ML IV SOLN
INTRAVENOUS | Status: DC | PRN
  Administered 2022-06-18: 300 mg via INTRAVENOUS

## 2022-06-18 MED ORDER — ROCURONIUM BROMIDE 10 MG/ML (PF) SYRINGE
PREFILLED_SYRINGE | INTRAVENOUS | Status: AC
  Filled 2022-06-18: qty 20

## 2022-06-18 MED ORDER — KETAMINE HCL-SODIUM CHLORIDE 100-0.9 MG/10ML-% IV SOSY
PREFILLED_SYRINGE | INTRAVENOUS | Status: DC | PRN
  Administered 2022-06-18 (×2): 10 mg via INTRAVENOUS
  Administered 2022-06-18: 20 mg via INTRAVENOUS

## 2022-06-18 MED ORDER — KETAMINE HCL 50 MG/5ML IJ SOSY
PREFILLED_SYRINGE | INTRAMUSCULAR | Status: AC
  Filled 2022-06-18: qty 5

## 2022-06-18 MED ORDER — PROPOFOL 10 MG/ML IV BOLUS
INTRAVENOUS | Status: AC
  Filled 2022-06-18: qty 20

## 2022-06-18 MED ORDER — OXYCODONE-ACETAMINOPHEN 5-325 MG PO TABS: 1.0000 | ORAL_TABLET | ORAL | 0 refills | Status: DC | PRN

## 2022-06-18 MED ORDER — ORAL CARE MOUTH RINSE: 15.0000 mL | Freq: Once | OROMUCOSAL | Status: AC

## 2022-06-18 MED ORDER — 0.9 % SODIUM CHLORIDE (POUR BTL) OPTIME
TOPICAL | Status: DC | PRN
  Administered 2022-06-18: 1000 mL

## 2022-06-18 MED ORDER — HYDROMORPHONE HCL 1 MG/ML IJ SOLN: 0.2500 mg | INTRAMUSCULAR | Status: DC | PRN

## 2022-06-18 MED ORDER — OXYCODONE HCL 5 MG PO TABS
5.0000 mg | ORAL_TABLET | Freq: Once | ORAL | Status: AC | PRN
  Administered 2022-06-18: 5 mg via ORAL

## 2022-06-18 MED ORDER — MIDAZOLAM HCL 5 MG/5ML IJ SOLN
INTRAMUSCULAR | Status: DC | PRN
  Administered 2022-06-18: 2 mg via INTRAVENOUS

## 2022-06-18 MED ORDER — KETOROLAC TROMETHAMINE 15 MG/ML IJ SOLN
15.0000 mg | INTRAMUSCULAR | Status: AC
  Administered 2022-06-18: 15 mg via INTRAVENOUS
  Filled 2022-06-18: qty 1

## 2022-06-18 MED ORDER — ONDANSETRON HCL 4 MG/2ML IJ SOLN: 4.0000 mg | Freq: Once | INTRAMUSCULAR | Status: DC | PRN

## 2022-06-18 MED ORDER — DEXAMETHASONE SODIUM PHOSPHATE 10 MG/ML IJ SOLN
INTRAMUSCULAR | Status: DC | PRN
  Administered 2022-06-18: 8 mg via INTRAVENOUS

## 2022-06-18 MED ORDER — ROCURONIUM BROMIDE 10 MG/ML (PF) SYRINGE
PREFILLED_SYRINGE | INTRAVENOUS | Status: DC | PRN
  Administered 2022-06-18: 100 mg via INTRAVENOUS
  Administered 2022-06-18: 20 mg via INTRAVENOUS
  Administered 2022-06-18: 30 mg via INTRAVENOUS

## 2022-06-18 MED ORDER — BUPIVACAINE LIPOSOME 1.3 % IJ SUSP
INTRAMUSCULAR | Status: AC
  Filled 2022-06-18: qty 20

## 2022-06-18 MED ORDER — MIDAZOLAM HCL 2 MG/2ML IJ SOLN
INTRAMUSCULAR | Status: AC
  Filled 2022-06-18: qty 2

## 2022-06-18 MED ORDER — LACTATED RINGERS IV SOLN: INTRAVENOUS | Status: DC | PRN

## 2022-06-18 MED ORDER — PROPOFOL 10 MG/ML IV BOLUS
INTRAVENOUS | Status: DC | PRN
  Administered 2022-06-18: 200 mg via INTRAVENOUS

## 2022-06-18 MED ORDER — GABAPENTIN 300 MG PO CAPS
300.0000 mg | ORAL_CAPSULE | ORAL | Status: AC
  Administered 2022-06-18: 300 mg via ORAL
  Filled 2022-06-18: qty 1

## 2022-06-18 MED ORDER — BUPIVACAINE-EPINEPHRINE 0.5% -1:200000 IJ SOLN
INTRAMUSCULAR | Status: DC | PRN
  Administered 2022-06-18: 50 mL

## 2022-06-18 MED ORDER — OXYCODONE HCL 5 MG/5ML PO SOLN: 5.0000 mg | Freq: Once | ORAL | Status: AC | PRN

## 2022-06-18 MED ORDER — FENTANYL CITRATE (PF) 250 MCG/5ML IJ SOLN
INTRAMUSCULAR | Status: DC | PRN
  Administered 2022-06-18: 150 ug via INTRAVENOUS
  Administered 2022-06-18: 100 ug via INTRAVENOUS

## 2022-06-18 MED ORDER — ONDANSETRON HCL 4 MG/2ML IJ SOLN
INTRAMUSCULAR | Status: DC | PRN
  Administered 2022-06-18: 4 mg via INTRAVENOUS

## 2022-06-18 MED ORDER — KETOROLAC TROMETHAMINE 30 MG/ML IJ SOLN: 30.0000 mg | Freq: Once | INTRAMUSCULAR | Status: DC | PRN

## 2022-06-18 MED ORDER — LACTATED RINGERS IR SOLN
Status: DC | PRN
  Administered 2022-06-18: 1000 mL

## 2022-06-18 MED ORDER — BUPIVACAINE-EPINEPHRINE (PF) 0.5% -1:200000 IJ SOLN
INTRAMUSCULAR | Status: AC
  Filled 2022-06-18: qty 30

## 2022-06-18 MED ORDER — LIDOCAINE HCL (PF) 2 % IJ SOLN
INTRAMUSCULAR | Status: AC
  Filled 2022-06-18: qty 5

## 2022-06-18 MED ORDER — OXYCODONE HCL 5 MG PO TABS
ORAL_TABLET | ORAL | Status: AC
  Filled 2022-06-18: qty 1

## 2022-06-18 MED ORDER — FENTANYL CITRATE (PF) 250 MCG/5ML IJ SOLN
INTRAMUSCULAR | Status: AC
  Filled 2022-06-18: qty 5

## 2022-06-18 MED ORDER — CEFAZOLIN IN SODIUM CHLORIDE 3-0.9 GM/100ML-% IV SOLN
3.0000 g | INTRAVENOUS | Status: AC
  Administered 2022-06-18: 3 g via INTRAVENOUS
  Filled 2022-06-18: qty 100

## 2022-06-18 MED ORDER — LIDOCAINE HCL (PF) 2 % IJ SOLN
INTRAMUSCULAR | Status: DC | PRN
  Administered 2022-06-18: 1.5 mg/kg/h via INTRADERMAL

## 2022-06-18 MED ORDER — BUPIVACAINE LIPOSOME 1.3 % IJ SUSP: 20.0000 mL | Freq: Once | INTRAMUSCULAR | Status: DC

## 2022-06-18 MED ORDER — DEXAMETHASONE SODIUM PHOSPHATE 10 MG/ML IJ SOLN
INTRAMUSCULAR | Status: AC
  Filled 2022-06-18: qty 1

## 2022-06-18 MED ORDER — LIDOCAINE 2% (20 MG/ML) 5 ML SYRINGE
INTRAMUSCULAR | Status: DC | PRN
  Administered 2022-06-18: 100 mg via INTRAVENOUS

## 2022-06-18 MED ORDER — CHLORHEXIDINE GLUCONATE CLOTH 2 % EX PADS: 6.0000 | MEDICATED_PAD | Freq: Once | CUTANEOUS | Status: DC

## 2022-06-18 MED ORDER — CHLORHEXIDINE GLUCONATE 0.12 % MT SOLN
15.0000 mL | Freq: Once | OROMUCOSAL | Status: AC
  Administered 2022-06-18: 15 mL via OROMUCOSAL

## 2022-06-18 MED ORDER — ONDANSETRON HCL 4 MG/2ML IJ SOLN
INTRAMUSCULAR | Status: AC
  Filled 2022-06-18: qty 2

## 2022-06-18 MED ORDER — ACETAMINOPHEN 500 MG PO TABS
1000.0000 mg | ORAL_TABLET | ORAL | Status: AC
  Administered 2022-06-18: 1000 mg via ORAL
  Filled 2022-06-18: qty 2

## 2022-06-18 MED ORDER — LACTATED RINGERS IV SOLN: INTRAVENOUS | Status: DC

## 2022-06-18 SURGICAL SUPPLY — 67 items
ADH SKN CLS APL DERMABOND .7 (GAUZE/BANDAGES/DRESSINGS) ×2
ANTIFOG SOL W/FOAM PAD STRL (MISCELLANEOUS) ×2
APL PRP STRL LF DISP 70% ISPRP (MISCELLANEOUS) ×2
BAG COUNTER SPONGE SURGICOUNT (BAG) ×2 IMPLANT
BAG SPNG CNTER NS LX DISP (BAG) ×2
BLADE SURG SZ11 CARB STEEL (BLADE) ×2 IMPLANT
CHLORAPREP W/TINT 26 (MISCELLANEOUS) ×2 IMPLANT
COVER MAYO STAND STRL (DRAPES) ×2 IMPLANT
COVER TIP SHEARS 8 DVNC (MISCELLANEOUS) ×2 IMPLANT
COVER TIP SHEARS 8MM DA VINCI (MISCELLANEOUS) ×2
DERMABOND ADVANCED .7 DNX12 (GAUZE/BANDAGES/DRESSINGS) IMPLANT
DEVICE TROCAR PUNCTURE CLOSURE (ENDOMECHANICALS) IMPLANT
DRAPE ARM DVNC X/XI (DISPOSABLE) ×6 IMPLANT
DRAPE COLUMN DVNC XI (DISPOSABLE) ×2 IMPLANT
DRAPE DA VINCI XI ARM (DISPOSABLE) ×6
DRAPE DA VINCI XI COLUMN (DISPOSABLE) ×2
ELECT L-HOOK LAP 45CM DISP (ELECTROSURGICAL) ×2
ELECT PENCIL ROCKER SW 15FT (MISCELLANEOUS) ×2 IMPLANT
ELECT REM PT RETURN 15FT ADLT (MISCELLANEOUS) ×2 IMPLANT
ELECTRODE L-HOOK LAP 45CM DISP (ELECTROSURGICAL) ×2 IMPLANT
GAUZE 4X4 16PLY ~~LOC~~+RFID DBL (SPONGE) IMPLANT
GLOVE BIO SURGEON STRL SZ7.5 (GLOVE) ×4 IMPLANT
GLOVE BIOGEL PI IND STRL 8 (GLOVE) ×4 IMPLANT
GOWN STRL REUS W/ TWL LRG LVL3 (GOWN DISPOSABLE) IMPLANT
GOWN STRL REUS W/ TWL XL LVL3 (GOWN DISPOSABLE) ×6 IMPLANT
GOWN STRL REUS W/TWL LRG LVL3 (GOWN DISPOSABLE) ×2
GOWN STRL REUS W/TWL XL LVL3 (GOWN DISPOSABLE) ×6
GRASPER SUT TROCAR 14GX15 (MISCELLANEOUS) IMPLANT
IRRIG SUCT STRYKERFLOW 2 WTIP (MISCELLANEOUS)
IRRIGATION SUCT STRKRFLW 2 WTP (MISCELLANEOUS) IMPLANT
KIT BASIN OR (CUSTOM PROCEDURE TRAY) ×2 IMPLANT
KIT TURNOVER KIT A (KITS) IMPLANT
MANIFOLD NEPTUNE II (INSTRUMENTS) ×2 IMPLANT
MESH 3DMAX 5X7 LT XLRG (Mesh General) IMPLANT
MESH 3DMAX 5X7 RT XLRG (Mesh General) IMPLANT
MESH SOFT 12X12IN BARD (Mesh General) IMPLANT
NDL SPNL 18GX3.5 QUINCKE PK (NEEDLE) ×2 IMPLANT
NEEDLE SPNL 18GX3.5 QUINCKE PK (NEEDLE) ×2 IMPLANT
PACK CARDIOVASCULAR III (CUSTOM PROCEDURE TRAY) ×2 IMPLANT
SEAL CANN UNIV 5-8 DVNC XI (MISCELLANEOUS) ×6 IMPLANT
SEAL XI 5MM-8MM UNIVERSAL (MISCELLANEOUS) ×6
SEALER VESSEL DA VINCI XI (MISCELLANEOUS)
SEALER VESSEL EXT DVNC XI (MISCELLANEOUS) IMPLANT
SET TUBE SMOKE EVAC HIGH FLOW (TUBING) ×2 IMPLANT
SOLUTION ANTFG W/FOAM PAD STRL (MISCELLANEOUS) ×2 IMPLANT
SOLUTION ELECTROLUBE (MISCELLANEOUS) ×2 IMPLANT
SPIKE FLUID TRANSFER (MISCELLANEOUS) ×2 IMPLANT
SUT MNCRL AB 4-0 PS2 18 (SUTURE) ×2 IMPLANT
SUT SILK 0 SH 30 (SUTURE) IMPLANT
SUT STRAFIX PDS 18 CTX (SUTURE) IMPLANT
SUT STRAFIX SYMMETRIC 0-0 24 (SUTURE)
SUT STRAFIX SYMMETRIC 1-0 12 (SUTURE)
SUT STRAFIX SYMMETRIC 1-0 24 (SUTURE)
SUT STRTFX SPIRAL PDS+ 2-0 23 (SUTURE)
SUT VLOC 180 2-0 9IN GS21 (SUTURE) IMPLANT
SUTURE STRAFIX SYMMETRC 0-0 24 (SUTURE) IMPLANT
SUTURE STRAFIX SYMMETRC 1-0 12 (SUTURE) IMPLANT
SUTURE STRAFIX SYMMETRC 1-0 24 (SUTURE) IMPLANT
SUTURE STRTFX SPRL PDS+ 2-0 23 (SUTURE) IMPLANT
SYR 20ML LL LF (SYRINGE) ×2 IMPLANT
TAPE STRIPS DRAPE STRL (GAUZE/BANDAGES/DRESSINGS) ×2 IMPLANT
TOWEL OR 17X26 10 PK STRL BLUE (TOWEL DISPOSABLE) ×2 IMPLANT
TOWEL OR NON WOVEN STRL DISP B (DISPOSABLE) IMPLANT
TROCAR ADV FIXATION 12X100MM (TROCAR) ×2 IMPLANT
TROCAR Z-THREAD FIOS 5X100MM (TROCAR) ×2 IMPLANT
TROCAR Z-THREAD OPTICAL 5X100M (TROCAR) IMPLANT
TUBING INSUFFLATION 10FT LAP (TUBING) ×2 IMPLANT

## 2022-06-18 NOTE — H&P (Signed)
Admitting Physician: Nickola Major Jezreel Sisk  Service: General surgery  CC: Hernia  Subjective   HPI: James Jordan is an 52 y.o. male who is here for hernia repair  James Jordan is a 52 y.o. male who is seen today as an office consultation for evaluation of New Consultation .  James Jordan had an umbilical hernia repair in 2004. He does not member if mesh was used. He now has a recurrent hernia at his bellybutton. He is also noticed some bulging in his right groin. These cause him symptoms of pain discomfort. It is hard for him to do the activities he wants to do.  Past Medical History:  Diagnosis Date   ACL tear - left knee 2002   Per patient, not repaired    Cancer ([place])    James Jordan   Complication of anesthesia    DVT (deep venous thrombosis) ([place])    History of pulmonary embolus (PE)    Hypertension    Umbilical hernia 0240    Past Surgical History:  Procedure Laterality Date   Jordan SURGERY Right 03/01/2022   cataract   Jordan SURGERY Right 2020   James lymphoma surgery   HERNIA REPAIR  [id number]   Umbilical   TONSILLECTOMY      Family History  Problem Relation Age of Onset   Diabetes Mother    Heart disease Maternal Aunt    Diabetes Maternal Uncle    Diabetes Maternal Grandfather    Lung cancer Paternal Grandmother    Lung cancer Paternal Grandfather     Social:  reports that he has never smoked. He has never been exposed to tobacco smoke. He has quit using smokeless tobacco.  His smokeless tobacco use included snuff. He reports that he does not drink alcohol and does not use drugs.  Allergies: Not on File  Medications: Current Outpatient Medications  Medication Instructions   apixaban (ELIQUIS) 2.5 mg, Oral, 2 times daily   sildenafil (VIAGRA) 25-50 mg, Oral, Daily PRN    ROS - all of the below systems have been reviewed with the patient and positives are indicated with bold text General: chills, fever or night sweats Eyes: blurry vision  or double vision ENT: epistaxis or sore throat Allergy/Immunology: itchy/watery eyes or nasal congestion Hematologic/Lymphatic: bleeding problems, blood clots or swollen lymph nodes Endocrine: temperature intolerance or unexpected weight changes Breast: new or changing breast lumps or nipple discharge Resp: cough, shortness of breath, or wheezing CV: chest pain or dyspnea on exertion GI: as per HPI GU: dysuria, trouble voiding, or hematuria MSK: joint pain or joint stiffness Neuro: TIA or stroke symptoms Derm: pruritus and skin lesion changes Psych: anxiety and depression  Objective   PE Blood pressure (!) 146/87, pulse 73, temperature 98.5 F (36.9 C), temperature source Oral, resp. rate 16, height '6\' 4"'$  (1.93 m), weight (!) 137.9 kg, SpO2 95 %. Constitutional: NAD; conversant; no deformities Eyes: Moist conjunctiva; no lid lag; anicteric; PERRL Neck: Trachea midline; no thyromegaly Lungs: Normal respiratory effort; no tactile fremitus CV: RRR; no palpable thrills; no pitting edema GI: Abd large reducible umbilical hernia with [place] defect. Palpable reducible right inguinal hernia.  MSK: Normal range of motion of extremities; no clubbing/cyanosis Psychiatric: Appropriate affect; alert and oriented x3 Lymphatic: No palpable cervical or axillary lymphadenopathy  No results found for this or any previous visit (from the past 24 hour(s)).  Imaging Orders  No imaging studies ordered today  CT Scan 1. Sequela of chronic pulmonary emboli,  with thin residual synechia within the bilateral lower lobe pulmonary arteries. The significant majority of the pulmonary emboli seen previously have resolved in the interim. 2. No acute pulmonary embolus. 3. Hepatic steatosis. 4. Colonic diverticulosis without diverticulitis. 5. Fat containing umbilical and right inguinal hernias. No bowel herniation.    Assessment and Plan   James Jordan is an 52 y.o. male with a recurrent  umbilical hernia and a right inguinal hernia  Umbilical hernia measures 3 cm wide by 4.6 cm with tall  I recommended robotic umbilical and right inguinal hernia repair with mesh. The procedure itself as well as its risk, benefits, and alternatives were discussed the patient in full. After full discussion all questions answered patient granted consent to proceed.   Felicie Morn, MD  [facility] Use [email or web address] to contact on call provider

## 2022-06-18 NOTE — Transfer of Care (Signed)
Immediate Anesthesia Transfer of Care Note  Patient: James Jordan  Procedure(s) Performed: ROBOTIC RECURRENT UMBILICAL HERNIA REPAIR WITH MESH ROBOTIC INITIAL RIGHT INGUINAL HERNIA REPAIR WITH MESH (Right)  Patient Location: PACU  Anesthesia Type:General  Level of Consciousness: oriented, sedated, and patient cooperative  Airway & Oxygen Therapy: Patient Spontanous Breathing and Patient connected to face mask oxygen  Post-op Assessment: Report given to RN and Post -op Vital signs reviewed and stable  Post vital signs: Reviewed  Last Vitals:  Vitals Value Taken Time  BP 162/90 06/18/22 1031  Temp 36.4 C 06/18/22 1030  Pulse 91 06/18/22 1033  Resp 17 06/18/22 1033  SpO2 99 % 06/18/22 1033  Vitals shown include unvalidated device data.  Last Pain:  Vitals:   06/18/22 0604  TempSrc:   PainSc: 0-No pain         Complications: No notable events documented.

## 2022-06-18 NOTE — Anesthesia Postprocedure Evaluation (Signed)
Anesthesia Post Note  Patient: James Jordan  Procedure(s) Performed: ROBOTIC RECURRENT UMBILICAL HERNIA REPAIR WITH MESH ROBOTIC INITIAL RIGHT INGUINAL HERNIA REPAIR WITH MESH (Right)     Patient location during evaluation: PACU Anesthesia Type: General Level of consciousness: awake and alert Pain management: pain level controlled Vital Signs Assessment: post-procedure vital signs reviewed and stable Respiratory status: spontaneous breathing, nonlabored ventilation, respiratory function stable and patient connected to nasal cannula oxygen Cardiovascular status: blood pressure returned to baseline and stable Postop Assessment: no apparent nausea or vomiting Anesthetic complications: no  No notable events documented.  Last Vitals:  Vitals:   06/18/22 1200 06/18/22 1215  BP: 132/86 (!) 145/89  Pulse: 65 74  Resp:  16  Temp:  36.7 C  SpO2: 93% 96%    Last Pain:  Vitals:   06/18/22 1304  TempSrc:   PainSc: [place]

## 2022-06-18 NOTE — Op Note (Signed)
Patient: James Jordan (1970/12/23, [id number])  Date of Surgery: 06/18/2022   Preoperative Diagnosis: RECURRENT UMBILICAL HERNIA, INITIAL RIGHT INGUINAL HERNIA   Postoperative Diagnosis: RECURRENT UMBILICAL HERNIA (6cm x 6cm), INITIAL RIGHT INGUINAL HERNIA, INITIAL LEFT INGUINAL HERNIA  Surgical Procedure:  ROBOTIC RECURRENT UMBILICAL HERNIA REPAIR WITH MESH  (6cm x 6cm) ROBOTIC INITIAL RIGHT INGUINAL HERNIA REPAIR WITH MESH ROBOTIC INITIAL LEFT INGUINAL HERNIA REPAIR WITH MESH BILATERAL POSTERIOR RECTUS MYOFASCIAL RELEASE  Operative Team Members:  Surgeon(s) and Role:    * Harmoni Lucus, Nickola Major, MD - Primary   Anesthesiologist: Barnet Glasgow, MD CRNA: Montel Clock, CRNA; Luciana Axe K, CRNA   Anesthesia: General   Fluids:  Total I/O In: 1200 [I.V.:1100; IV Piggyback:100] Out: 200 [Urine:175; UXNAT:55]  Complications: None   Drains:  None  Specimen: None  Disposition:  PACU - hemodynamically stable.  Plan of Care: Discharge to home after PACU  Indications for Procedure: James Jordan is a 52 y.o. male who presented with a recurrent umbilical hernia and a symptomatic right inguinal hernia.  I recommended robotic ventral hernia repair with mesh and robotic right inguinal hernia repair with mesh.  The procedure itself as well as the risks, benefits and alternatives were described.  The risks discussed included but were not limited to the risk of infection, bleeding, damage to nearby structures, recurrent hernia, chronic pain, and mesh complication requiring removal.  After a full discussion and all questions answered, the patient granted consent to proceed.  Findings:  Recurrent umbilical hernia Hernia Location: Ventral hernia location: Umbilical (M3) Hernia Size:  6 cm x 6 cm  Mesh Size &Type:  35 cm tall x 23 cm wide Bard Soft Mesh Mesh Position: [place]ubl[place] - Retromuscular Myofascial Releases: Bilateral Myofascial Release: Posterior rectus myofascial  release   Right inguinal Large direct fat containing right inguinal hernia and small fatty hernia in the femoral space Totally extraperitoneal repair Bard 3D Max extra-large right sided mesh  Left inguinal Indirect left inguinal hernia and small fatty hernia in femoral space Totally extraperitoneal repair Bard 3D Max extra-large left sided mesh   Description of Procedure: The patient was positioned supine, moderately flexed at the umbilical level, padded and secured on the operating table.  A timeout procedure was performed.    What is described is a robotic, totally extraperitoneal retromuscular incisional hernia repair with bilateral rectus myofascial release and retromuscular mesh placement.  Laparoscopic Portion: The retrorectus space was entered in the LEFT hypochondrium, at approximately the midclavicular line utilizing a 5 mm optical-viewing trocar.  Upon safe entry into this space, it was insufflated while performing a blunt dissection with the camera still in the optical trocar. A rectus myofascial release was performed on the LEFT side. Dissection was carried out laterally in the retromuscular plane to the edge of the rectus sheath progressively disconnecting the rectus muscle from the underlying posterior rectus sheath. Both the segmental innervation as well as the intercostal artery and vein brances to the rectus muscle were individually preserved.    During the left sided retrorectus dissection, a 12 mm trocar was placed into the lateral most edge of the retrorectus space.  With these initial trocars in position, the medial most aspect of the retrorectus plane was identified, and the posterior sheath was visualized as it inserted on the linea alba. The posterior sheath was incised with cautery entering the preperitoneal plane. A crossover was performed dissecting under the linea alba in the preperitoneal plane until the right rectus sheath was  identified.  After identification of  the right rectus sheath, it was incised vertically to enter the retrorectus space on the right. A rectus myofascial release was performed on the RIGHT side.  Blunt dissection was carried out laterally in the retromuscular plane to the edge of the rectus sheath progressively disconnecting the rectus muscle from the underlying posterior rectus sheath. Both the segmental innervation as well as the intercostal artery and vein brances to the rectus muscle were individually preserved.   At this juncture, both retrorectus planes were initially connected to each other and there was space for further trocar placement. An 8 mm robotic trocar was placed in the midclavicular line in right retrorectus space.  A 7m robotic trocar was placed within the left rectus musculature in the upper abdomen, and not through the linea alba.  The initial 5 mm access trocar in the midclavicular line within the left retrorectus space was switched out for an 8 mm robotic trocar.   Robotic Portion: The Intuitive daVinci Xi surgical robot was docked in the standard fashion and the procedure begun from the robotic console. A Prograsp instrument and monopolar shears were used for the dissection.  Dissection was carried down inferiorly preserving the peritoneum and the preperitoneal fat in the midline as it was gently dissected off of the overlying linea alba.  On the right side, the posterior rectus sheath was progressively disconnected from its insertion on the linea alba. This allowed for progression of the right side rectus myofascial release.  The rectus myofascial release accomplished medialization of the posterior rectus sheath towards the midline and disinsertion of the rectus muscle from its surrounding fascia, and thus its encasement in the rectus sheath, allowing for widening of the rectus muscle and transfer of the rectus flap towards the midline.  This will allow for future inset of the medial aspect of the flap for abdominal wall  reconstruction.  Similarly, on the left side, the posterior rectus sheath was also progressively disconnected from its insertion on the linea alba.  This allowed for progression of the left side rectus myofascial release.  The rectus myofascial release accomplished medialization of the posterior rectus sheath towards the midline and disinsertion of the rectus muscle from its surrounding fascia, and thus its encasement in the rectus sheath, allowing for widening of the rectus muscle and transfer of the rectus flap towards the midline.  This will allow for future inset of the medial aspect of the flap for abdominal wall reconstruction.  During the dissection of the midline the hernia defect was identified and the hernia sac was partially reducible, therefore a few defects were created in hernia peritoneum.  These defects were later closed with a running 2-0 Ethicon Stratafix Spiral PDS suture.  Both the left and the right rectus myofascial releases were performed towards the lower abdomen, past the arcuate line bilaterally.  During this dissection, the peritoneum and preperitoneal fat in the midline were further preserved below the hernia as they were dissected off of the overlying linea alba.    There was a direct and small fatty femoral hernia on the RIGHT.  Utilizing a transabdominal pre peritoneal technique (TAPP), a horizontal incision was made in the peritoneum, immediately below the umbilicus.  Dissection was carried out in the pre peritoneal space down to the level of the hernia sac which was reduced into the peritoneal cavity completely.  The cord contents were parietalized and preserved.  A large pre peritoneal dissection was performed to uncover the direct, indirect, femoral  and obturator spaces.  Cooper's ligament was uncovered medially and the psoas muscle uncovered laterally.  The mesh, as documented above, was opened and advanced into the pre peritoneal position so that it more than  adequately covered the indirect, direct, femoral and obturator spaces.  The mesh laid flat, with no inferior folds and covered the entire myopectineal orifice.  The mesh was fixated with 0 silk suture to Cooper's ligament and the posterior aspect of the rectus muscle.    There was an indirect and small fatty femoral hernia on the LEFT.  Utilizing a transabdominal pre peritoneal technique (TAPP), a horizontal incision was made in the peritoneum, immediately below the umbilicus.  Dissection was carried out in the pre peritoneal space down to the level of the hernia sac which was reduced into the peritoneal cavity completely.  The cord contents were parietalized and preserved.  A large pre peritoneal dissection was performed to uncover the direct, indirect, femoral and obturator spaces.  Cooper's ligament was uncovered medially and the psoas muscle uncovered laterally.  The mesh, as documented above, was opened and advanced into the pre peritoneal position so that it more than adequately covered the indirect, direct, femoral and obturator spaces.  The mesh laid flat, with no inferior folds and covered the entire myopectineal orifice.  The mesh was fixated with 0 silk suture to Cooper's ligament and the posterior aspect of the rectus muscle.    The hernia defect area was now visualized fully.  The hernia defects were located in the umbilical regions. Utilizing a metric ruler, the defect are was measured intracorporeally to be 6 cm horizontal by 6 cm vertical.  The hernia defect was closed utilizing a continuous, #1 Ethicon Stratafix Symmetric PDS Plus suture.  The hernia defect, and subsequently the rectus musculature, came together well for a complete abdominal wall reconstruction.  The dissected out retrorectus space was measured with a metric ruler so as to determine the size of the proposed mesh.    The robot was undocked and the laparoscope was inserted, inspecting for hemostasis.  The mesh deployment was  performed laparoscopically.  Laparoscopic Portion:  A transversus abdominis plane (TAP) block was performed bilaterally with a mixture of marcaine and Exparel.  The anesthetic was first injected into the plane between the transversus abdominis and internal abdominal oblique muscles on the left. The TAP was repeated on the contralateral side.   A piece of Bard Soft was opened and trimmed to 35 cm tall x 23 cm wide. The mesh was advanced into the retrorectus space and the mesh positioned flat against the intact posterior rectus sheaths. The mesh was not fixated as it occupied the entire retromuscular plane, and also covered all of the trocars.  The trocars were removed and the skin closed with 4-0 Monocryl subcuticular sutures and skin glue.   Louanna Raw, MD General, Bariatric, & Minimally Invasive Surgery [place]

## 2022-06-18 NOTE — Discharge Instructions (Signed)
 VENTRAL HERNIA REPAIR POST OPERATIVE INSTRUCTIONS  Thinking Clearly  The anesthesia may cause you to feel different for 1 or 2 days. Do not drive, drink alcohol, or make any big decisions for at least 2 days.  Nutrition When you wake up, you will be able to drink small amounts of liquid. If you do not feel sick, you can slowly advance your diet to regular foods. Continue to drink lots of fluids, usually about 8 to 10 glasses per day. Eat a high-fiber diet so you don't strain during bowel movements. High-Fiber Foods Foods high in fiber include beans, bran cereals and whole-grain breads, peas, dried fruit (figs, apricots, and dates), raspberries, blackberries, strawberries, sweet corn, broccoli, baked potatoes with skin, plums, pears, apples, greens, and nuts. Activity Slowly increase your activity. Be sure to get up and walk every hour or so to prevent blood clots. No heavy lifting or strenuous activity for 4 weeks following surgery to prevent hernias at your incision sites or recurrence of your hernia. It is normal to feel tired. You may need more sleep than usual.  Get your rest but make sure to get up and move around frequently to prevent blood clots and pneumonia.  Work and Return to School You can go back to work when you feel well enough. Discuss the timing with your surgeon. You can usually go back to school or work 1 week or less after an laparoscopic or an open repair. If your work requires heavy lifting or strenuous activity you need to be placed on light duty for 4 weeks following surgery. You can return to gym class, sports or other physical activities 4 weeks after surgery.  Wound Care You may experience significant bruising throughout the abdominal wall that may track down into the groin including into the scrotum in males.  Rest, elevating the groin and scrotum above the level of the heart, ice and compression with tight fitting underwear or an abdominal binder can help.   Always wash your hands before and after touching near your incision site. Do not soak in a bathtub until cleared at your follow up appointment. You may take a shower 24 hours after surgery. A small amount of drainage from the incision is normal. If the drainage is thick and yellow or the site is red, you may have an infection, so call your surgeon. If you have a drain in one of your incisions, it will be taken out in office when the drainage stops. Steri-Strips will fall off in 7 to 10 days or they will be removed during your first office visit. If you have dermabond glue covering over the incision, allow the glue to flake off on its own. Protect the new skin, especially from the sun. The sun can burn and cause darker scarring. Your scar will heal in about 4 to 6 weeks and will become softer and continue to fade over the next year.  The cosmetic appearance of the incisions will improve over the course of the first year after surgery. Sensation around your incision will return in a few weeks or months.  Bowel Movements After intestinal surgery, you may have loose watery stools for several days. If watery diarrhea lasts longer than 3 days, contact your surgeon. Pain medication (narcotics) can cause constipation. Increase the fiber in your diet with high-fiber foods if you are constipated. You can take an over the counter stool softener like Colace to avoid constipation.  Additional over the counter medications can also be used   if Colace isn't sufficient (for example, Milk of Magnesia or Miralax).  Pain The amount of pain is different for each person. Some people need only 1 to 3 doses of pain control medication, while others need more. Take alternating doses of tylenol and ibuprofen around the clock for the first five days following surgery.  This will provide a baseline of pain control and help with inflammation.  Take the narcotic pain medication in addition if needed for severe pain.  Contact  Your Surgeon at [phone], if you have: Pain that will not go away Pain that gets worse A fever of more than 101F (38.3C) Repeated vomiting Swelling, redness, bleeding, or bad-smelling drainage from your wound site Strong abdominal pain No bowel movement or unable to pass gas for 3 days Watery diarrhea lasting longer than 3 days  Pain Control The goal of pain control is to minimize pain, keep you moving and help you heal. Your surgical team will work with you on your pain plan. Most often a combination of therapies and medications are used to control your pain. You may also be given medication (local anesthetic) at the surgical site. This may help control your pain for several days. Extreme pain puts extra stress on your body at a time when your body needs to focus on healing. Do not wait until your pain has reached a level "10" or is unbearable before telling your doctor or nurse. It is much easier to control pain before it becomes severe. Following a laparoscopic procedure, pain is sometimes felt in the shoulder. This is due to the gas inserted into your abdomen during the procedure. Moving and walking helps to decrease the gas and the right shoulder pain.  Use the guide below for ways to manage your post-operative pain. Learn more by going to facs.org/safepaincontrol.  How Intense Is My Pain Common Therapies to Feel Better       I hardly notice my pain, and it does not interfere with my activities.  I notice my pain and it distracts me, but I can still do activities (sitting up, walking, standing).  Non-Medication Therapies  Ice (in a bag, applied over clothing at the surgical site), elevation, rest, meditation, massage, distraction (music, TV, play) walking and mild exercise Splinting the abdomen with pillows +  Non-Opioid Medications Acetaminophen (Tylenol) Non-steroidal anti-inflammatory drugs (NSAIDS) Aspirin, Ibuprofen (Motrin, Advil) Naproxen (Aleve) Take these as  needed, when you feel pain. Both acetaminophen and NSAIDs help to decrease pain and swelling (inflammation).      My pain is hard to ignore and is more noticeable even when I rest.  My pain interferes with my usual activities.  Non-Medication Therapies  +  Non-Opioid medications  Take on a regular schedule (around-the-clock) instead of as needed. (For example, Tylenol every 6 hours at 9:00 am, 3:00 pm, 9:00 pm, 3:00 am and Motrin every 6 hours at 12:00 am, 6:00 am, 12:00 pm, 6:00 pm)         I am focused on my pain, and I am not doing my daily activities.  I am groaning in pain, and I cannot sleep. I am unable to do anything.  My pain is as bad as it could be, and nothing else matters.  Non-Medication Therapies  +  Around-the-Clock Non-Opioid Medications  +  Short-acting opioids  Opioids should be used with other medications to manage severe pain. Opioids block pain and give a feeling of euphoria (feel high). Addiction, a serious side effect of opioids, is   rare with short-term (a few days) use.  Examples of short-acting opioids include: Tramadol (Ultram), Hydrocodone (Norco, Vicodin), Hydromorphone (Dilaudid), Oxycodone (Oxycontin)     The above directions have been adapted from the American College of Surgeons Surgical Patient Education Program.  Please refer to the ACS website if needed: [email or web address]://[email or web address]/-/media/files/[email or web address]   Letecia Arps, MD [facility] [address], [address], [place], [place]  [address] ?  [address] [address], [place], [place]   [address] [phone] ? [phone] ? FAX [phone] Web site: [email or web address]  

## 2022-06-18 NOTE — Anesthesia Procedure Notes (Signed)
Procedure Name: Intubation Date/Time: 06/18/2022 7:48 AM  Performed by: Jenne Campus, CRNAPre-anesthesia Checklist: Patient identified, Emergency Drugs available, Suction available and Patient being monitored Patient Re-evaluated:Patient Re-evaluated prior to induction Oxygen Delivery Method: Circle System Utilized Preoxygenation: Pre-oxygenation with 100% oxygen Induction Type: IV induction Ventilation: Mask ventilation without difficulty and Oral airway inserted - appropriate to patient size Laryngoscope Size: Sabra Heck and 3 Grade View: Grade I Tube type: Oral Tube size: 8.0 mm Number of attempts: 1 Airway Equipment and Method: Stylet and Oral airway Placement Confirmation: ETT inserted through vocal cords under direct vision, positive ETCO2 and breath sounds checked- equal and bilateral Secured at: 24 cm Tube secured with: Tape Dental Injury: Teeth and Oropharynx as per pre-operative assessment

## 2022-08-25 ENCOUNTER — Encounter: Payer: Managed Care, Other (non HMO) | Admitting: Family Medicine

## 2022-12-17 IMAGING — DX DG CHEST 1V PORT
1 series · 2 of 2 positions shown · non-contrast
Comparison: 10/19/2018

CLINICAL DATA: Tachycardia

EXAM:
PORTABLE CHEST 1 VIEW

[Series 1: chest · 0.14mm/px · 2 of 2 slices shown]
[im 1/2]
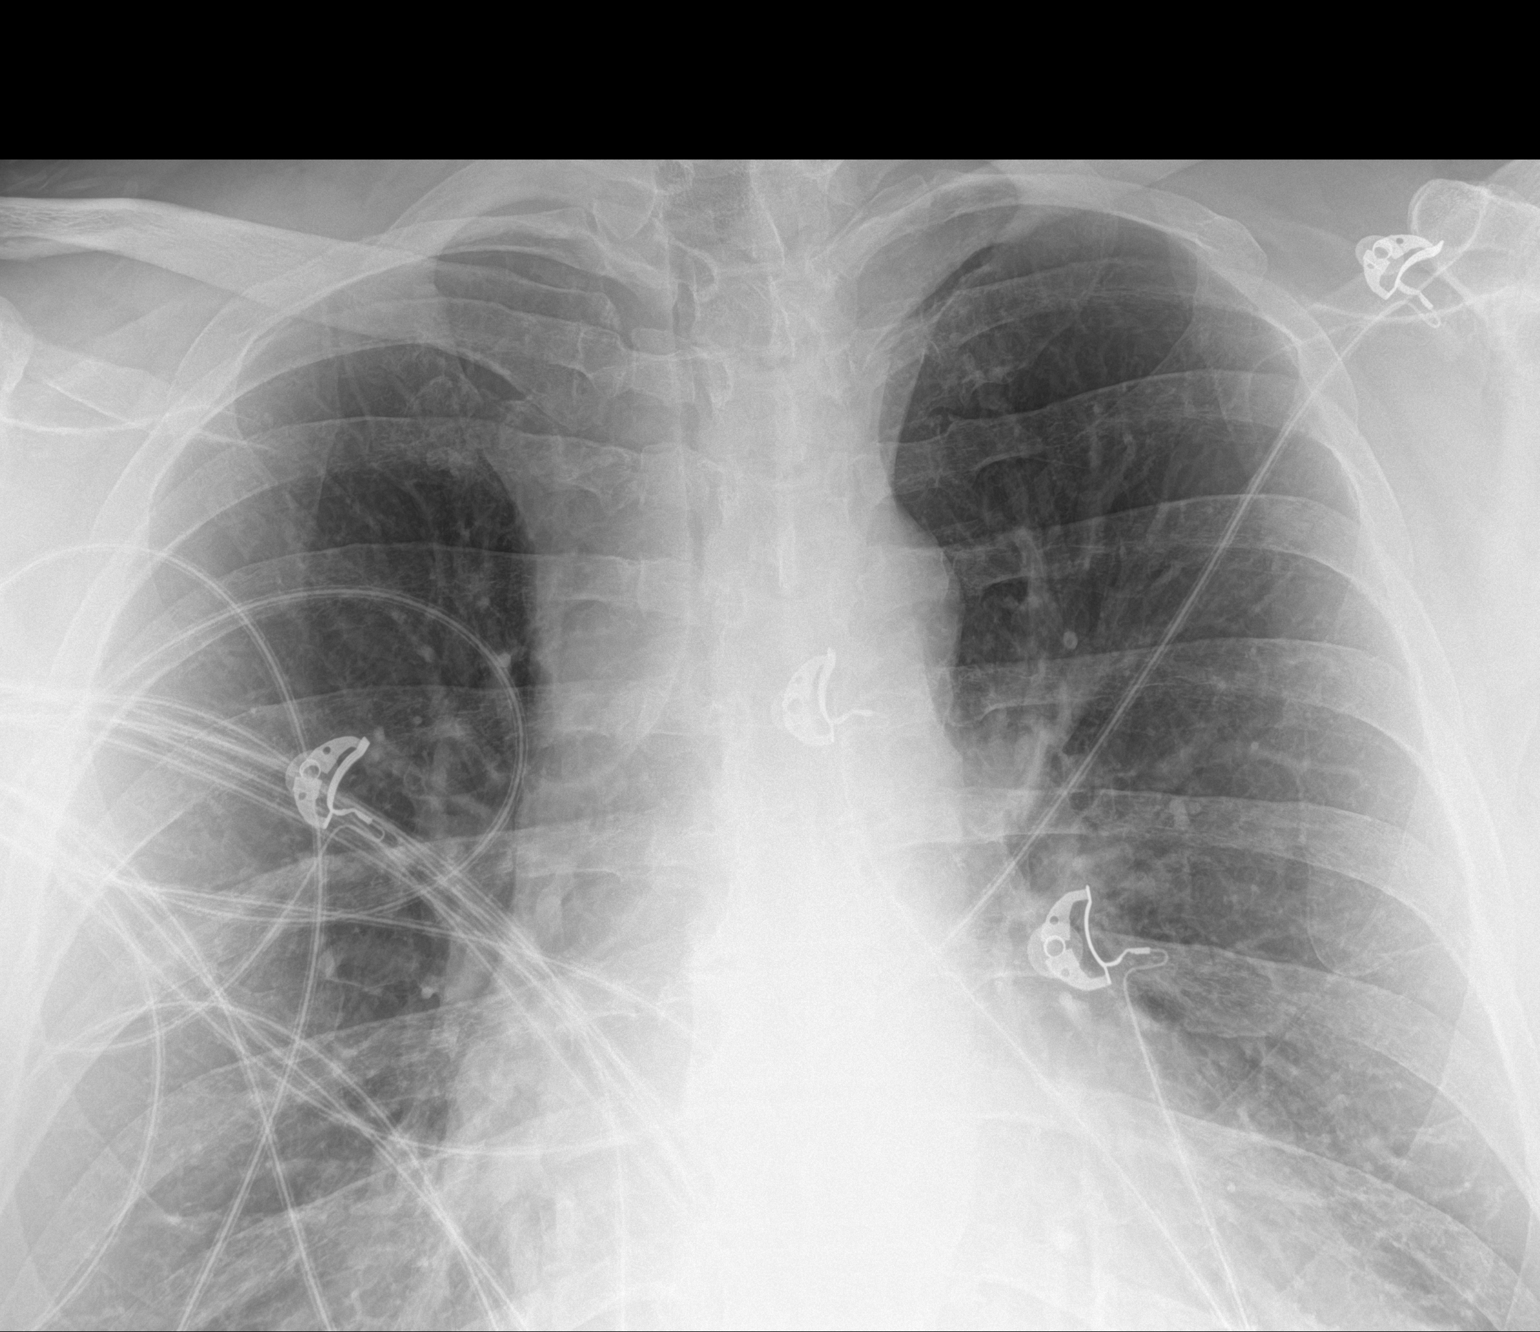
[im 2/2]
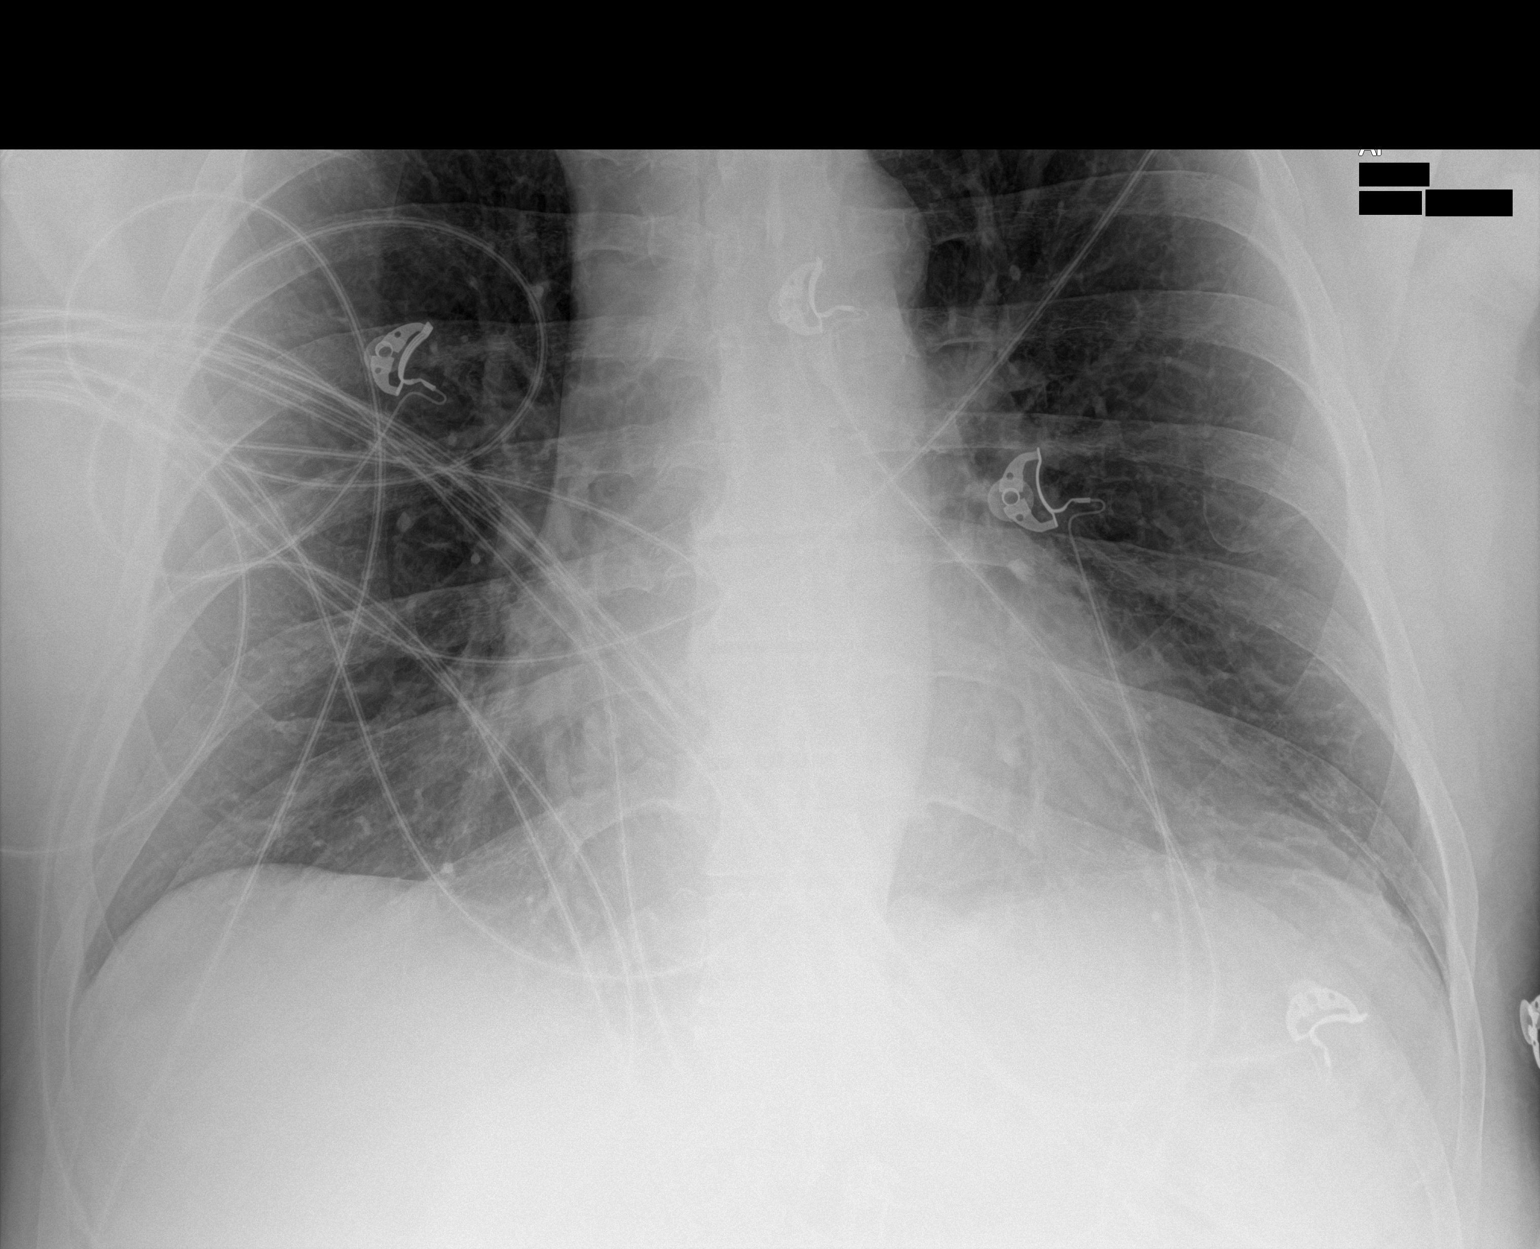

[2 of 2 positions shown; findings below may reference images not displayed]

FINDINGS: The heart size and mediastinal contours are within normal limits.
Both lungs are clear. The visualized skeletal structures are
unremarkable.
IMPRESSION: No active disease.

## 2023-03-07 ENCOUNTER — Ambulatory Visit: Payer: Managed Care, Other (non HMO) | Admitting: Family Medicine

## 2023-03-07 VITALS — Temp 97.8°F | Ht 76.0 in | Wt 312.2 lb

## 2023-03-07 DIAGNOSIS — R42 Dizziness and giddiness: Secondary | ICD-10-CM | POA: Diagnosis not present

## 2023-03-07 MED ORDER — PREDNISONE 10 MG PO TABS: ORAL_TABLET | ORAL | 0 refills | Status: AC

## 2023-03-07 NOTE — Progress Notes (Signed)
Acute Office Visit   Subjective:  Patient ID: James Jordan, male    DOB: Sep 27, 1970, 52 y.o.   MRN: [id number]  Chief Complaint  Patient presents with   Follow-up    Pt reports he was told by Dr.Greene he may have allergies. Pt reports he put peroxide and alcohol in his left ear. He felt popping in ear and this has seem to cause balance issues and describes 2x jabs over the weekend.    HPI Patient is a 52 year old male that is here for an acute visit. Patient reports he is experiencing dizziness. He reports he started having some intermittent dizziness. He reports the dizziness is random, more frequently, not pin pointed to position changes. Lasting a while, but will resolve when sitting down. He reports the dizziness started after applying peroxide and alcohol in his left ear on Wednesday. He reports he left the peroxide in the ear canal longer than he usually does. He felt popping in ear and reports he has had two sharp pains in the left ear that last about a second. Denies headaches and no unusual blurry vision. Denies chest pain or shortness of breath. Denies any other symptoms related to allergies.   He reports he has been using Claritin since Thursday or Friday with no relief. On Saturday, he used Debrox on the left ear.   He denies being dizzy during visit.   ROS See HPI above      Objective:  BP: 122/78 Temp 97.8 F (36.6 C)   Ht 6\' 4"  (1.93 m)   Wt (!) 312 lb 4 oz (141.6 kg)   SpO2 98%   BMI 38.01 kg/m    Physical Exam Vitals reviewed.  Constitutional:      General: He is not in acute distress.    Appearance: Normal appearance. He is obese. He is not ill-appearing, toxic-appearing or diaphoretic.  HENT:     Head: Normocephalic and atraumatic.     Right Ear: Tympanic membrane, ear canal and external ear normal.     Left Ear: Ear canal and external ear normal.     Ears:     Comments: There was mild clear fluid behind the TM at 7-8 o'clock. No signs of  infection.  Eyes:     General:        Right eye: No discharge.        Left eye: No discharge.     Conjunctiva/sclera: Conjunctivae normal.     Comments: Wearing glasses   Cardiovascular:     Rate and Rhythm: Normal rate and regular rhythm.     Heart sounds: Normal heart sounds. No murmur heard.    No friction rub. No gallop.  Pulmonary:     Effort: Pulmonary effort is normal. No respiratory distress.     Breath sounds: Normal breath sounds.  Musculoskeletal:        General: Normal range of motion.  Skin:    General: Skin is warm and dry.  Neurological:     General: No focal deficit present.     Mental Status: He is alert and oriented to person, place, and time. Mental status is at baseline.  Psychiatric:        Mood and Affect: Mood normal.        Behavior: Behavior normal.        Thought Content: Thought content normal.        Judgment: Judgment normal.       Assessment & Plan:  Dizziness -     predniSONE; Take 6 tablets (60 mg total) by mouth daily with breakfast for 1 day, THEN 5 tablets (50 mg total) daily with breakfast for 1 day, THEN 4 tablets (40 mg total) daily with breakfast for 1 day, THEN 3 tablets (30 mg total) daily with breakfast for 1 day, THEN 2 tablets (20 mg total) daily with breakfast for 1 day, THEN 1 tablet (10 mg total) daily with breakfast for 1 day.  Dispense: 21 tablet; Refill: 0  -Through shared decision making, decided to start Prednisone 10mg , 6 day taper for dizziness and clear fluid behind the left tympanic membrane. Advised to not take any NSAIDS, such as Ibuprofen, Advil, Aleve, or Naproxen while taking Prednisone. Initially advised to take Flonase, but patient reports it does not help when he has used it before.  -Recommend to hydrate with water, 64-100oz of water a day. Orthostatics were completed and there was a change in blood pressure.  -If not improved, symptoms become worse, or change in symptoms, follow up with either Dr. Neva Seat or this  provider.  -Advise if he develops any chest pain or shortness of breath while having dizziness, have someone drive you to the emergency department or call 911.  -Advise to not clean his ears with peroxide and alcohol. This could be a reason for your dizziness.   Zandra Abts, NP

## 2023-03-07 NOTE — Patient Instructions (Signed)
-  Through shared decision making, decided to start Prednisone 10mg , 6 day taper for dizziness and clear fluid behind the left tympanic membrane. Do not take any NSAIDS, such as Ibuprofen, Advil, Aleve, or Naproxen while taking Prednisone. -Recommend to hydrate with water, 64-100oz of water a day.  -If not improved, symptoms become worse, or change in symptoms, follow up with either Dr. Neva Seat or this provider.  -If you develop any chest pain or shortness of breath while having dizziness, have someone drive you to the emergency department or call 911.  -Advise to not clean your ear with peroxide and alcohol. This could be a reason for your dizziness.

## 2023-03-14 ENCOUNTER — Telehealth: Payer: Self-pay | Admitting: Family Medicine

## 2023-03-14 NOTE — Telephone Encounter (Signed)
Talked to pt, he is going to call back to schedule appt.

## 2023-03-14 NOTE — Telephone Encounter (Signed)
Has not had a visit since 03/2022 with Dr Neva Seat patient should make an appointment and bring paperwork with him so we can discuss it

## 2023-03-14 NOTE — Telephone Encounter (Signed)
Caller name: JEREMAIH KLIMA  On DPR?: Yes  Call back number: [phone] (mobile)  Provider they see: Shade Flood, MD  Reason for call:   Pt called stating he needs FMLA paper work completed. But even though Neva Seat is PCP issues involve other doctors' in [facility]. He'd like to talk to Dr. Neva Seat to inform him of the situation.

## 2023-03-31 ENCOUNTER — Encounter: Payer: Self-pay | Admitting: Family Medicine

## 2023-03-31 ENCOUNTER — Ambulatory Visit: Payer: Managed Care, Other (non HMO) | Admitting: Family Medicine

## 2023-03-31 VITALS — BP 136/100 | HR 67 | Temp 97.6°F | Ht 76.0 in | Wt 314.0 lb

## 2023-03-31 DIAGNOSIS — Z87898 Personal history of other specified conditions: Secondary | ICD-10-CM

## 2023-03-31 DIAGNOSIS — H6990 Unspecified Eustachian tube disorder, unspecified ear: Secondary | ICD-10-CM

## 2023-03-31 DIAGNOSIS — J309 Allergic rhinitis, unspecified: Secondary | ICD-10-CM

## 2023-03-31 MED ORDER — FLUTICASONE PROPIONATE 50 MCG/ACT NA SUSP: 2.0000 | Freq: Every day | NASAL | 6 refills | Status: AC

## 2023-03-31 NOTE — Patient Instructions (Addendum)
Thanks for coming in today.  I am glad to hear the dizziness has improved.  If that does not continue to improve with the change in eyewear and use of Flonase, I do recommend meeting with ear nose and throat specialist.  Let me know and I am happy to place that referral.  If any return of dizziness or worsening symptoms please follow-up and we can look at other causes.  I will work on your FMLA paperwork to reflect previous time out and need for medical appointments.  Follow-up in the next 3 months for physical. Keep a record of your blood pressures outside of the office and if those are running over 140/90, be seen sooner.  Take care!

## 2023-03-31 NOTE — Progress Notes (Signed)
Subjective:  Patient ID: James Jordan, male    DOB: 25-May-1971  Age: 52 y.o. MRN: [id number]  CC:  Chief Complaint  Patient presents with   Ear Pain    Follow up on his Left ear    FMLA Paperwork    Pt has the paperwork with him     HPI James Jordan presents for   Ear pain: Last discussed in October 2023.  Bilateral ear fullness at that time with history of allergic rhinitis.  Off and on for years at that time.  Your canals were clear at that time, possible eustachian tube dysfunction with underlying allergies.  Flonase nasal spray over-the-counter Claritin recommended with option of meeting with ENT.  He was seen by my colleague on September 23 for dizziness.  Noted after applying peroxide and alcohol to his left ear, brief pain in the left ear for about a second and popping sound.  Mild clear fluid behind the left TM at 7-8 o'clock but no sign of infection on exam at that time.  Treated with prednisone 6-day taper, hydration, and RTC precautions.  Advised against cleaning ear with peroxide/alcohol.   Dizziness improved - not constant, few mild symptoms only but much better. Was told he had dry eyes at optho last week, and plan for adjusted Rx - new Rx ready. Some light sensitivity with shield on new lenses.  Rare sharp pain in left ear at times. No change in hearing recently, but intermittent fulness. Off flonase recently - had been doing well.  Would like to see if new glasses will solve prior issue prior to ENT eval.  Popping sound in ear if blowing nose at times.  Nasal congestion every morning.  Works in dusty environment.   FMLA paperwork Missed few days with recent illness and days out of work - asked for ppwk if needed for future visits.  Missed 03/07/23. Left early a few days prior to last visit d/t dizziness, one day late since last visit - d/t dizziness.  Appt with Dr. Bertis Ruddy yearly for hx of PE, on Eliquis.  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      History Patient Active Problem List   Diagnosis Date Noted   Bilateral lower extremity edema 06/02/2021   Bilateral pulmonary embolism (HCC) 04/05/2021   Acute deep vein thrombosis (DVT) of popliteal vein of left lower extremity (HCC)    PAC (premature atrial contraction) 06/01/2016   Obesity, Class II, BMI 35-39.9 04/01/2016   Past Medical History:  Diagnosis Date   ACL tear - left knee 2002   Per patient, not repaired    Cancer (HCC)    MALT lymphoma right eye   Complication of anesthesia    DVT (deep venous thrombosis) (HCC)    History of pulmonary embolus (PE)    Hypertension    Umbilical hernia 2003   Past Surgical History:  Procedure Laterality Date   EYE SURGERY Right 03/01/2022   cataract   EYE SURGERY Right 2020   MALT lymphoma surgery   HERNIA REPAIR  2004   Umbilical   TONSILLECTOMY     No Known Allergies Prior to Admission medications   Medication Sig Start Date End Date Taking? Authorizing Provider  apixaban (ELIQUIS) 2.5 MG TABS tablet Take 1 tablet (2.5 mg total) by mouth 2 (two) times daily. 04/02/22  Yes Gorsuch, Ni, MD  sildenafil (VIAGRA) 50 MG tablet Take 0.5-1 tablets (25-50 mg total) by mouth  daily as needed for erectile dysfunction. 11/30/21  Yes Shade Flood, MD   Social History   Socioeconomic History   Marital status: Married    Spouse name: Not on file   Number of children: Not on file   Years of education: Not on file   Highest education level: Some college, no degree  Occupational History   Occupation: warehouse  Tobacco Use   Smoking status: Never    Passive exposure: Never   Smokeless tobacco: Former    Types: Snuff  Vaping Use   Vaping status: Never Used  Substance and Sexual Activity   Alcohol use: No   Drug use: No   Sexual activity: Yes  Other Topics Concern   Not on file  Social History Narrative   Lives with his wife.   Social Determinants of Health   Financial Resource Strain: Low Risk   (03/27/2023)   Overall Financial Resource Strain (CARDIA)    Difficulty of Paying Living Expenses: Not hard at all  Food Insecurity: No Food Insecurity (03/27/2023)   Hunger Vital Sign    Worried About Running Out of Food in the Last Year: Never true    Ran Out of Food in the Last Year: Never true  Transportation Needs: No Transportation Needs (03/27/2023)   PRAPARE - Administrator, Civil Service (Medical): No    Lack of Transportation (Non-Medical): No  Physical Activity: Sufficiently Active (03/27/2023)   Exercise Vital Sign    Days of Exercise per Week: 5 days    Minutes of Exercise per Session: 60 min  Stress: No Stress Concern Present (03/27/2023)   [employer] of Occupational Health - Occupational Stress Questionnaire    Feeling of Stress : Not at all  Social Connections: Moderately Integrated (03/27/2023)   Social Connection and Isolation Panel [NHANES]    Frequency of Communication with Friends and Family: More than three times a week    Frequency of Social Gatherings with Friends and Family: Once a week    Attends Religious Services: More than 4 times per year    Active Member of [employer] or Organizations: No    Attends Engineer, structural: Not on file    Marital Status: Married  Catering manager Violence: Not on file    Review of Systems Per HPI  Objective:   Vitals:   03/31/23 1055  BP: (!) 144/88  Pulse: 67  Temp: 97.6 F (36.4 C)  TempSrc: Oral  SpO2: 97%  Weight: (!) 314 lb (142.4 kg)  Height: 6\' 4"  (1.93 m)     Physical Exam Vitals reviewed.  Constitutional:      Appearance: He is well-developed.  HENT:     Head: Normocephalic and atraumatic.     Right Ear: Ear canal normal.     Left Ear: Ear canal normal.     Ears:     Comments: Minimal clear fluid at the base of the left TM, canal clear.  No retraction, erythema or perforation.  No fluid within canal.  Right TM pearly gray, canal clear. Eyes:     Extraocular  Movements:     Right eye: No nystagmus.     Left eye: No nystagmus.  Neck:     Vascular: No carotid bruit or JVD.  Cardiovascular:     Rate and Rhythm: Normal rate and regular rhythm.     Heart sounds: Normal heart sounds. No murmur heard. Pulmonary:     Effort: Pulmonary effort is normal.  Breath sounds: Normal breath sounds. No rales.  Musculoskeletal:     Right lower leg: No edema.     Left lower leg: No edema.  Skin:    General: Skin is warm and dry.  Neurological:     Mental Status: He is alert and oriented to person, place, and time.  Psychiatric:        Mood and Affect: Mood normal.        Assessment & Plan:  ALBEN KOSIER is a 52 y.o. male . Allergic rhinitis, unspecified seasonality, unspecified trigger - Plan: fluticasone (FLONASE) 50 MCG/ACT nasal spray Dysfunction of Eustachian tube, unspecified laterality - Plan: fluticasone (FLONASE) 50 MCG/ACT nasal spray  -Reassuring exam, continue Flonase, option of ENT eval.  Symptoms appear to be due to eustachian tube dysfunction.  Dizziness has improved.  Potential component of vision.  History of dizziness - Plan: fluticasone (FLONASE) 50 MCG/ACT nasal spray As above, improved.  RTC precautions.  FMLA paperwork to be completed based on previous timeout and potential follow-up visits with myself and specialists.   Meds ordered this encounter  Medications   fluticasone (FLONASE) 50 MCG/ACT nasal spray    Sig: Place 2 sprays into both nostrils daily.    Dispense:  16 g    Refill:  6   Patient Instructions  Thanks for coming in today.  I am glad to hear the dizziness has improved.  If that does not continue to improve with the change in eyewear and use of Flonase, I do recommend meeting with ear nose and throat specialist.  Let me know and I am happy to place that referral.  If any return of dizziness or worsening symptoms please follow-up and we can look at other causes.  I will work on your FMLA paperwork to  reflect previous time out and need for medical appointments.  Follow-up in the next 3 months for physical. Keep a record of your blood pressures outside of the office and if those are running over 140/90, be seen sooner.  Take care!      Signed,   Meredith Staggers, MD [facility] 03/31/23 11:50 AM

## 2023-04-08 ENCOUNTER — Inpatient Hospital Stay: Payer: Managed Care, Other (non HMO) | Attending: Hematology and Oncology

## 2023-04-08 ENCOUNTER — Encounter: Payer: Self-pay | Admitting: Hematology and Oncology

## 2023-04-08 ENCOUNTER — Inpatient Hospital Stay: Payer: Managed Care, Other (non HMO) | Admitting: Hematology and Oncology

## 2023-04-08 ENCOUNTER — Telehealth: Payer: Self-pay | Admitting: Family Medicine

## 2023-04-08 ENCOUNTER — Other Ambulatory Visit: Payer: Self-pay

## 2023-04-08 VITALS — BP 155/91 | HR 78 | Temp 98.5°F | Resp 17 | Wt 313.2 lb

## 2023-04-08 DIAGNOSIS — R600 Localized edema: Secondary | ICD-10-CM

## 2023-04-08 DIAGNOSIS — E66812 Obesity, class 2: Secondary | ICD-10-CM

## 2023-04-08 DIAGNOSIS — I82432 Acute embolism and thrombosis of left popliteal vein: Secondary | ICD-10-CM

## 2023-04-08 DIAGNOSIS — Z86718 Personal history of other venous thrombosis and embolism: Secondary | ICD-10-CM | POA: Insufficient documentation

## 2023-04-08 DIAGNOSIS — R030 Elevated blood-pressure reading, without diagnosis of hypertension: Secondary | ICD-10-CM | POA: Diagnosis not present

## 2023-04-08 DIAGNOSIS — Z79899 Other long term (current) drug therapy: Secondary | ICD-10-CM | POA: Insufficient documentation

## 2023-04-08 DIAGNOSIS — Z7901 Long term (current) use of anticoagulants: Secondary | ICD-10-CM | POA: Insufficient documentation

## 2023-04-08 DIAGNOSIS — I2699 Other pulmonary embolism without acute cor pulmonale: Secondary | ICD-10-CM

## 2023-04-08 DIAGNOSIS — K760 Fatty (change of) liver, not elsewhere classified: Secondary | ICD-10-CM | POA: Insufficient documentation

## 2023-04-08 DIAGNOSIS — R202 Paresthesia of skin: Secondary | ICD-10-CM | POA: Diagnosis not present

## 2023-04-08 DIAGNOSIS — K5730 Diverticulosis of large intestine without perforation or abscess without bleeding: Secondary | ICD-10-CM | POA: Insufficient documentation

## 2023-04-08 DIAGNOSIS — Z87898 Personal history of other specified conditions: Secondary | ICD-10-CM

## 2023-04-08 DIAGNOSIS — M7989 Other specified soft tissue disorders: Secondary | ICD-10-CM | POA: Insufficient documentation

## 2023-04-08 LAB — CBC WITH DIFFERENTIAL (CANCER CENTER ONLY)
Abs Immature Granulocytes: 0.02 10*3/uL (ref 0.00–0.07)
Basophils Absolute: 0 10*3/uL (ref 0.0–0.1)
Basophils Relative: 0 %
Eosinophils Absolute: 0.1 10*3/uL (ref 0.0–0.5)
Eosinophils Relative: 2 %
HCT: 49.1 % (ref 39.0–52.0)
Hemoglobin: 16.6 g/dL (ref 13.0–17.0)
Immature Granulocytes: 0 %
Lymphocytes Relative: 22 %
Lymphs Abs: 1.5 10*3/uL (ref 0.7–4.0)
MCH: 30.9 pg (ref 26.0–34.0)
MCHC: 33.8 g/dL (ref 30.0–36.0)
MCV: 91.3 fL (ref 80.0–100.0)
Monocytes Absolute: 0.6 10*3/uL (ref 0.1–1.0)
Monocytes Relative: 9 %
Neutro Abs: 4.4 10*3/uL (ref 1.7–7.7)
Neutrophils Relative %: 67 %
Platelet Count: 295 10*3/uL (ref 150–400)
RBC: 5.38 MIL/uL (ref 4.22–5.81)
RDW: 12.8 % (ref 11.5–15.5)
WBC Count: 6.6 10*3/uL (ref 4.0–10.5)
nRBC: 0 % (ref 0.0–0.2)

## 2023-04-08 LAB — BASIC METABOLIC PANEL - CANCER CENTER ONLY
Anion gap: 7 (ref 5–15)
BUN: 16 mg/dL (ref 6–20)
CO2: 27 mmol/L (ref 22–32)
Calcium: 9.8 mg/dL (ref 8.9–10.3)
Chloride: 105 mmol/L (ref 98–111)
Creatinine: 1.15 mg/dL (ref 0.61–1.24)
GFR, Estimated: >60 mL/min (ref >60)
Glucose, Bld: 117 mg/dL — ABNORMAL HIGH (ref 70–99)
Potassium: 4.5 mmol/L (ref 3.5–5.1)
Sodium: 139 mmol/L (ref 135–145)

## 2023-04-08 MED ORDER — APIXABAN 2.5 MG PO TABS: 2.5000 mg | ORAL_TABLET | Freq: Two times a day (BID) | ORAL | 3 refills | Status: DC

## 2023-04-08 NOTE — Telephone Encounter (Signed)
Faxed back to requested number on forms

## 2023-04-08 NOTE — Assessment & Plan Note (Signed)
I wonder if the patient may have undiagnosed obstructive sleep apnea That could cause his intermittent hypertension, poor sleep quality and his mental fog We discussed importance of dietary modification and exercise

## 2023-04-08 NOTE — Progress Notes (Signed)
[facility] OFFICE PROGRESS NOTE  Shade Flood, MD  ASSESSMENT & PLAN:  History of DVT (deep vein thrombosis) From our previous visit, the patient has agree on extended duration Eliquis at reduced dose indefinitely We discussed risk factor for recurrent thrombosis He has no bleeding complications so far I refilled his prescription today and plan to see him back in a year  Obesity, Class II, BMI 35-39.9 I wonder if the patient may have undiagnosed obstructive sleep apnea That could cause his intermittent hypertension, poor sleep quality and his mental fog We discussed importance of dietary modification and exercise  Elevated BP without diagnosis of hypertension His blood pressure is intermittently elevated We discussed importance of close monitoring and follow-up with his primary care doctor along with lifestyle changes  Orders Placed This Encounter  Procedures   Basic Metabolic Panel - Cancer Center Only    Standing Status:   Future    Standing Expiration Date:   04/07/2024   CBC with Differential (Cancer Center Only)    Standing Status:   Future    Standing Expiration Date:   04/07/2024    The total time spent in the appointment was 30 minutes encounter with patients including review of chart and various tests results, discussions about plan of care and coordination of care plan   All questions were answered. The patient knows to call the clinic with any problems, questions or concerns. No barriers to learning was detected.    James Delay, MD 10/25/202410:26 AM  INTERVAL HISTORY: James Jordan 52 y.o. male returns for further follow-up for history of DVT He is doing well on anticoagulation therapy He complained of sensation of mental fogginess since the summer He have seen an ophthalmologist in June and recently an optometrist without abnormalities He is in the process of being referred to see an ENT physician We discussed risk factors for  recurrent DVT We discussed importance of dietary modification and weight loss We discussed risk and benefits of continuing Eliquis versus switching to aspirin only  SUMMARY OF HEMATOLOGIC HISTORY:  James Jordan was seen in December 2022 due to history of DVT/PE  the patient had traveled to the beach several months ago and has been complaining of intermittent left lower extremity achiness He has intermittent leg swelling, tenderness but denies trauma to the left lower extremity Previously, he had a very stressful job but over the past 2 months, he had moved to a different role.   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     LEFT:  - Findings consistent with acute deep vein thrombosis involving the left popliteal vein.  - Findings consistent with age indeterminate deep vein thrombosis involving the left femoral vein, left peroneal veins, and left posterior tibial veins.   CT angiogram showed 1. Large volume of pulmonary emboli in the lungs bilaterally, with dilatation of the right atrium and right ventricle (RV to LV ratio of 1.1) indicative of elevated right-sided heart pressures and right heart strain. These findings have been shown to be associated with a increased morbidity and mortality in the setting pulmonary embolism. 2. No evidence of pulmonary infarct or significant pleural effusion. 3. Hepatic steatosis.  He was admitted and appropriately anticoagulated and was discharged on Eliquis He  is compliant taking Eliquis as directed Recently, he felt some weird tingling sensation and he presented to the emergency department for evaluation  On May 28, 2021, repeat venous Doppler ultrasound showed  RIGHT:  - No evidence of common femoral vein  obstruction.     LEFT:  - Findings consistent with age indeterminate deep vein thrombosis involving the left popliteal vein, left posterior tibial veins, and left peroneal veins.  - Findings appear essentially unchanged compared to previous examination  Repeat CT imaging of the chest, abdomen and pelvis showed 1. Sequela of chronic pulmonary emboli, with thin residual synechia within the bilateral lower lobe pulmonary arteries. The significant majority of the pulmonary emboli seen previously have resolved in the interim. 2. No acute pulmonary embolus. 3. Hepatic steatosis. 4. Colonic diverticulosis without diverticulitis. 5. Fat containing umbilical and right inguinal hernias. No bowel herniation.   He had prior surgeries before and never had perioperative thromboembolic events. The patient had never been on testosterone replacement therapy  There is no family history of blood clots or miscarriages. He denies bleeding complications from anticoagulation therapy  I have reviewed the past medical history, past surgical history, social history and family history with the patient and they are unchanged from previous note.  ALLERGIES:  has No Known Allergies.  MEDICATIONS:  Current Outpatient Medications  Medication Sig Dispense Refill   apixaban (ELIQUIS) 2.5 MG TABS tablet Take 1 tablet (2.5 mg total) by mouth 2 (two) times daily. 180 tablet 3   fluticasone (FLONASE) 50 MCG/ACT nasal spray Place 2 sprays into both nostrils daily. 16 g 6   sildenafil (VIAGRA) 50 MG tablet Take 0.5-1 tablets (25-50 mg total) by mouth daily as needed for erectile dysfunction. 10 tablet 6   No current facility-administered medications for this visit.     REVIEW OF SYSTEMS:   Constitutional: Denies fevers, chills or night sweats Eyes: Denies blurriness of vision Ears, nose, mouth, throat, and face: Denies mucositis or sore throat Respiratory: Denies cough, dyspnea or wheezes Cardiovascular: Denies  palpitation, chest discomfort or lower extremity swelling Gastrointestinal:  Denies nausea, heartburn or change in bowel habits Skin: Denies abnormal skin rashes Lymphatics: Denies new lymphadenopathy or easy bruising Neurological:Denies numbness, tingling or new weaknesses Behavioral/Psych: Mood is stable, no new changes  All other systems were reviewed with the patient and are negative.  PHYSICAL EXAMINATION: ECOG PERFORMANCE STATUS: 1 - Symptomatic but completely ambulatory  Vitals:   04/08/23 0829  BP: (!) 155/91  Pulse: 78  Resp: 17  Temp: 98.5 F (36.9 C)  SpO2: 97%   Filed Weights   04/08/23 0829  Weight: (!) 313 lb 3.2 oz (142.1 kg)    GENERAL:alert, no distress and comfortable  LABORATORY DATA:  I have reviewed the data as listed     Component Value Date/Time   NA 139 04/08/2023 0803   K 4.5 04/08/2023 0803   CL 105 04/08/2023 0803   CO2 27 04/08/2023 0803   GLUCOSE 117 (H) 04/08/2023 0803   BUN 16 04/08/2023 0803   CREATININE 1.15 04/08/2023 0803   CREATININE 1.08 04/01/2016 1246   CALCIUM 9.8 04/08/2023 0803   PROT 6.3 (L) 05/03/2021 1841   ALBUMIN 3.7 05/03/2021 1841   AST 19 05/03/2021 1841   ALT 20 05/03/2021 1841   ALKPHOS 50 05/03/2021 1841   BILITOT 0.6 05/03/2021 1841   GFRNONAA >60 04/08/2023 0803   GFRAA >60 10/19/2018 2128    No results found for: "SPEP", "UPEP"  Lab Results  Component Value  Date   WBC 6.6 04/08/2023   NEUTROABS 4.4 04/08/2023   HGB 16.6 04/08/2023   HCT 49.1 04/08/2023   MCV 91.3 04/08/2023   PLT 295 04/08/2023      Chemistry      Component Value Date/Time   NA 139 04/08/2023 0803   K 4.5 04/08/2023 0803   CL 105 04/08/2023 0803   CO2 27 04/08/2023 0803   BUN 16 04/08/2023 0803   CREATININE 1.15 04/08/2023 0803   CREATININE 1.08 04/01/2016 1246      Component Value Date/Time   CALCIUM 9.8 04/08/2023 0803   ALKPHOS 50 05/03/2021 1841   AST 19 05/03/2021 1841   ALT 20 05/03/2021 1841   BILITOT 0.6  05/03/2021 1841

## 2023-04-08 NOTE — Assessment & Plan Note (Signed)
From our previous visit, the patient has agree on extended duration Eliquis at reduced dose indefinitely We discussed risk factor for recurrent thrombosis He has no bleeding complications so far I refilled his prescription today and plan to see him back in a year

## 2023-04-08 NOTE — Assessment & Plan Note (Signed)
His blood pressure is intermittently elevated We discussed importance of close monitoring and follow-up with his primary care doctor along with lifestyle changes

## 2023-04-08 NOTE — Telephone Encounter (Signed)
FMLA paperwork completed.  Paperwork completed and placed in fax bin at back nurse station

## 2023-04-12 NOTE — Progress Notes (Signed)
Obtained prior authorization approval by phone from [facility] for Eliquis 2.5mg  tablets. [place] [id number]. Medication is approved 04/12/2023 through 04/11/2024. Notified [facility] of approval. No other needs or concerns noted at this time.

## 2023-07-10 IMAGING — MR MR KNEE*R* W/O CM
7 series · 40 of 40 positions shown · non-contrast
Comparison: Right knee radiographs 10/01/2021

CLINICAL DATA: Knee trauma. Medial right knee pain and weakness for
1 month.

EXAM:
MRI OF THE RIGHT KNEE WITHOUT CONTRAST
TECHNIQUE: Multiplanar, multisequence MR imaging of the knee was performed. No
intravenous contrast was administered.

[Series 3: T2 fat-sat · axial · 4.0mm · 0.62mm/px · z∈[-96,+54]mm · 5 of 31 slices shown (1 of 3)]
[im 1/31]
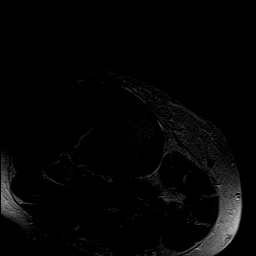
[im 8/31]
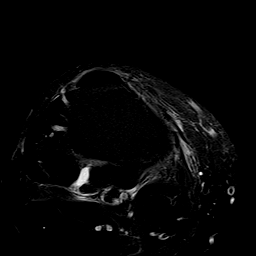
[im 16/31]
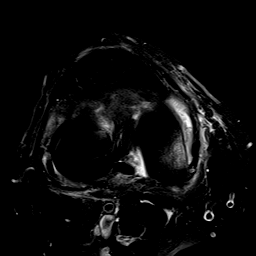
[im 23/31]
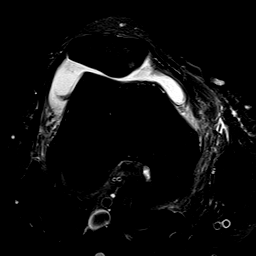
[im 31/31]
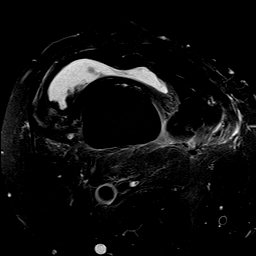

[Series 4: T1 · coronal · 4.0mm · 0.59mm/px · 5 of 30 slices shown]
[im 1/30]
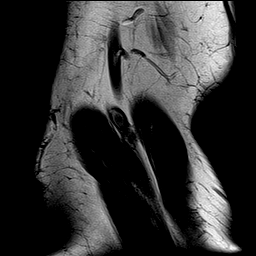
[im 8/30]
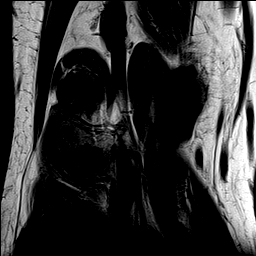
[im 15/30]
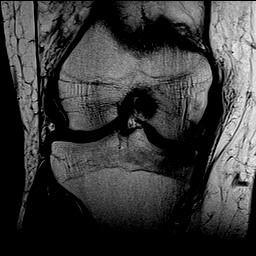
[im 22/30]
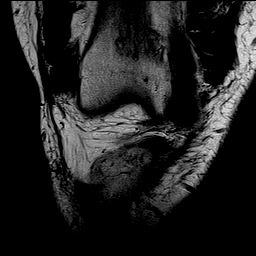
[im 30/30]
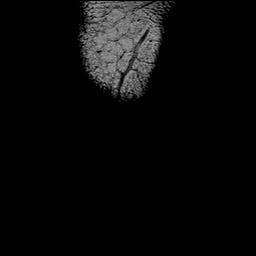

[Series 5: T2 fat-sat · coronal · 4.0mm · 0.59mm/px · 6 of 30 slices shown (2 of 3)]
[im 1/30]
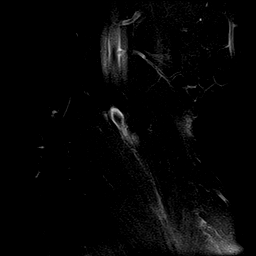
[im 6/30]
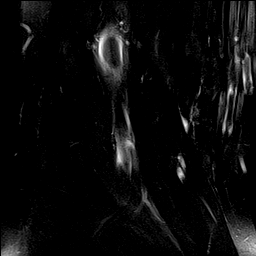
[im 12/30]
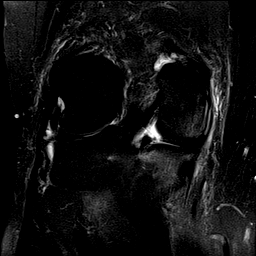
[im 18/30]
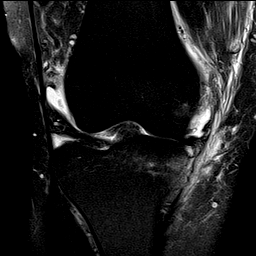
[im 24/30]
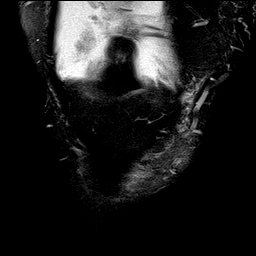
[im 30/30]
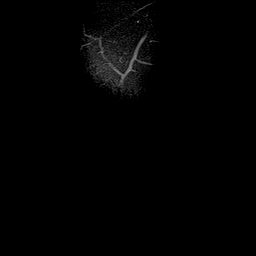

[Series 6: PD fat-sat · coronal · 4.0mm · 0.59mm/px · 6 of 30 slices shown (1 of 3)]
[im 1/30]
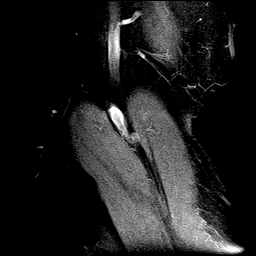
[im 6/30]
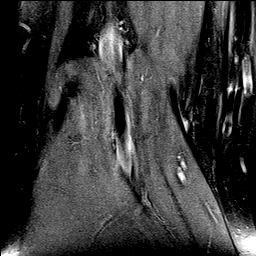
[im 12/30]
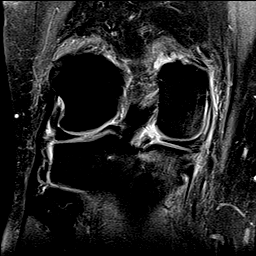
[im 18/30]
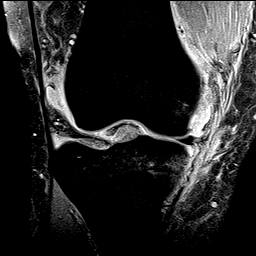
[im 24/30]
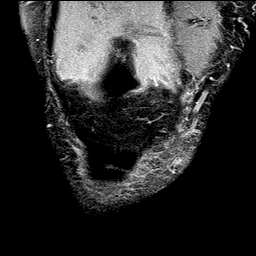
[im 30/30]
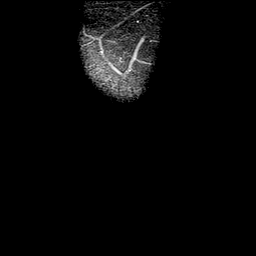

[Series 7: PD fat-sat · sagittal · 3.0mm · 0.59mm/px · 7 of 36 slices shown (2 of 3)]
[im 1/36]
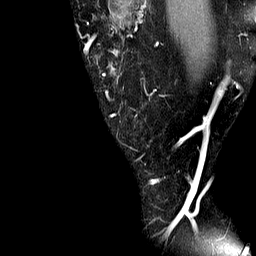
[im 6/36]
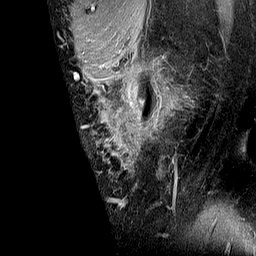
[im 12/36]
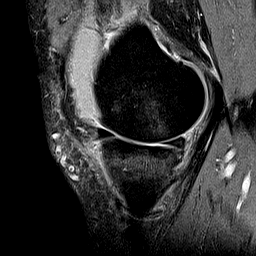
[im 18/36]
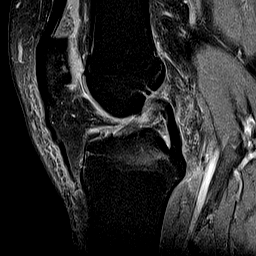
[im 24/36]
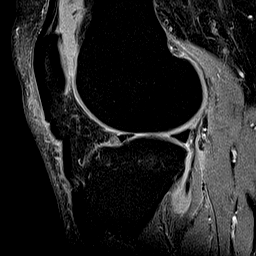
[im 30/36]
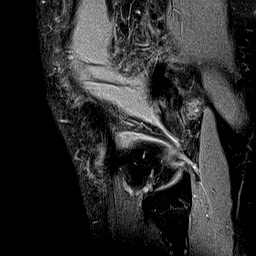
[im 36/36]
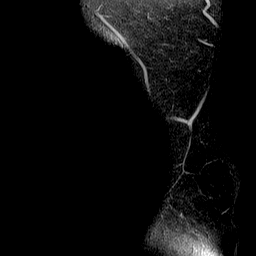

[Series 8: T2 fat-sat · sagittal · 3.0mm · 0.59mm/px · 7 of 36 slices shown (3 of 3)]
[im 1/36]
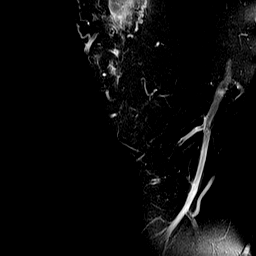
[im 6/36]
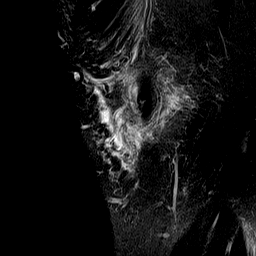
[im 12/36]
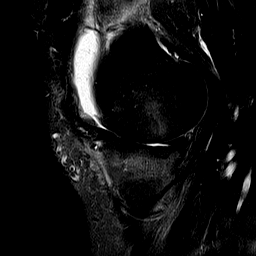
[im 18/36]
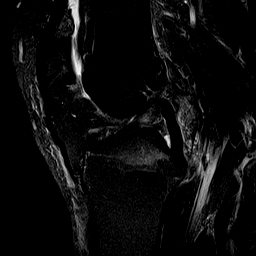
[im 24/36]
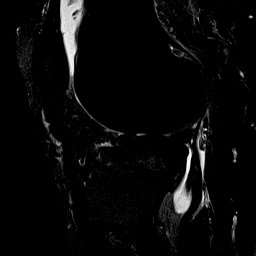
[im 30/36]
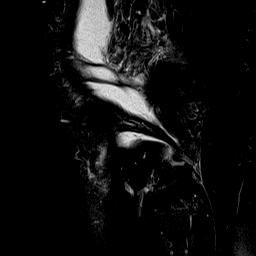
[im 36/36]
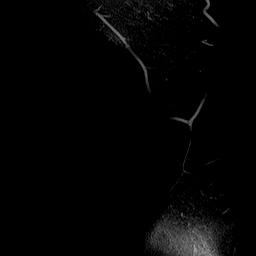

[Series 9: PD fat-sat · oblique · 2.0mm · 0.59mm/px · 4 of 22 slices shown (3 of 3)]
[im 1/22]
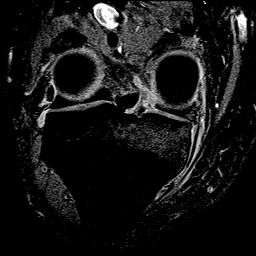
[im 8/22]
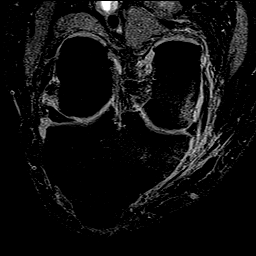
[im 15/22]
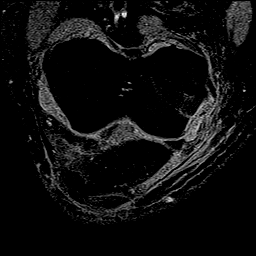
[im 22/22]
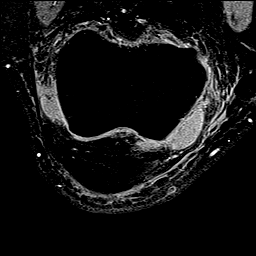

[40 of 40 positions shown; findings below may reference images not displayed]

FINDINGS: MENISCI

Medial meniscus: There is absence of normal meniscal tissue
throughout the 10 mm of the posterior horn of the medial meniscus
closer to the root (sagittal series 7 images 21 and 22 and coronal
series 6, image 11). There is intermediate proton density signal
within the junction of the body and posterior horn of the medial
meniscus. Mild extrusion of the body of the medial meniscus.

Lateral meniscus: Small oblique undersurface tear within the central
third of the mid AP dimension of the body of the lateral meniscus
(coronal series 6, image 14).

LIGAMENTS

Cruciates: The ACL and PCL are intact.

Collaterals: Mild edema around the medial collateral ligament
compatible with a grade 1 sprain. The fibular collateral ligament,
biceps femoris tendon, iliotibial band, and popliteus tendon are
intact.

CARTILAGE

Patellofemoral: Full-thickness cartilage loss within the superior
aspect of the patellar apex with mild subchondral edema (sagittal
image 17 and axial image 7). High-grade focal thinning of the
inferior aspect of the medial patellar facet cartilage (axial image
9) with mild subchondral marrow edema. Moderate trochlear notch and
medial trochlear cartilage thinning.

Medial: Diffuse high-grade partial to full-thickness cartilage loss
throughout the mid to posteromedial tibial plateau and mid to
posterior weight-bearing medial femoral condyle. There is horizontal
linear subchondral decreased T1 and decreased T2 signal within the
medial and mildly posterior aspect of the weight-bearing medial
femoral condyle in a region measuring up to 12 mm in AP dimension
and 5 mm in transverse dimension (sagittal image 27 and coronal
image 14) with high-grade surrounding marrow edema subchondral
insufficiency fracture. No overlying cortical collapse. Within the
adjacent posteromedial aspect of the medial tibial plateau there is
also horizontal linear decreased T1 and decreased T2 signal
measuring up to 9 mm in transverse dimension and 14 mm in AP
dimension with high-grade surrounding marrow edema, an additional
acute to subacute subchondral insufficiency fracture. No overlying
cortical collapse.

Lateral: Moderate thinning of the lateral aspect of the
weight-bearing lateral femoral condyle.

Joint: Moderatejoint effusion. Normal Hoffa's fat pad. No plical
thickening.

Popliteal Fossa:  No Baker's cyst.

Extensor Mechanism:  Intact quadriceps tendon and patellar tendon.

Bones:  Please see "medial cartilage" section above.

Other: None.
IMPRESSION: 1. Acute to subacute subchondral insufficiency fractures within the
medial posterior aspect of the weight-bearing medial femoral condyle
and medial tibial plateau with high-grade surrounding marrow edema
but no overlying cortical collapse.
2. Mild-to-moderate patellofemoral and moderate to high-grade medial
compartment cartilage degenerative changes.
3. Absence of normal meniscal tissue within the root of the
posterior horn of the medial meniscus, a focal complex tear with
mild extrusion of the body of the medial meniscus.
4. Small oblique undersurface tear within the central third of the
mid AP dimension of the body of the lateral meniscus.
5. Moderate joint effusion.

## 2023-07-21 ENCOUNTER — Ambulatory Visit (INDEPENDENT_AMBULATORY_CARE_PROVIDER_SITE_OTHER): Payer: Managed Care, Other (non HMO) | Admitting: Family Medicine

## 2023-07-21 ENCOUNTER — Encounter: Payer: Self-pay | Admitting: Family Medicine

## 2023-07-21 VITALS — BP 132/82 | HR 74 | Temp 98.5°F | Ht 75.25 in | Wt 309.4 lb

## 2023-07-21 DIAGNOSIS — Z5181 Encounter for therapeutic drug level monitoring: Secondary | ICD-10-CM

## 2023-07-21 DIAGNOSIS — N529 Male erectile dysfunction, unspecified: Secondary | ICD-10-CM | POA: Diagnosis not present

## 2023-07-21 DIAGNOSIS — Z9229 Personal history of other drug therapy: Secondary | ICD-10-CM

## 2023-07-21 DIAGNOSIS — G4700 Insomnia, unspecified: Secondary | ICD-10-CM | POA: Diagnosis not present

## 2023-07-21 DIAGNOSIS — Z0000 Encounter for general adult medical examination without abnormal findings: Secondary | ICD-10-CM | POA: Diagnosis not present

## 2023-07-21 DIAGNOSIS — Z1211 Encounter for screening for malignant neoplasm of colon: Secondary | ICD-10-CM

## 2023-07-21 DIAGNOSIS — Z6838 Body mass index (BMI) 38.0-38.9, adult: Secondary | ICD-10-CM

## 2023-07-21 DIAGNOSIS — Z7901 Long term (current) use of anticoagulants: Secondary | ICD-10-CM

## 2023-07-21 DIAGNOSIS — R030 Elevated blood-pressure reading, without diagnosis of hypertension: Secondary | ICD-10-CM | POA: Diagnosis not present

## 2023-07-21 DIAGNOSIS — Z131 Encounter for screening for diabetes mellitus: Secondary | ICD-10-CM | POA: Diagnosis not present

## 2023-07-21 DIAGNOSIS — E66812 Obesity, class 2: Secondary | ICD-10-CM

## 2023-07-21 DIAGNOSIS — Z13220 Encounter for screening for lipoid disorders: Secondary | ICD-10-CM

## 2023-07-21 DIAGNOSIS — Z1329 Encounter for screening for other suspected endocrine disorder: Secondary | ICD-10-CM

## 2023-07-21 LAB — LIPID PANEL
Cholesterol: 155 mg/dL (ref 0–200)
HDL: 49.2 mg/dL (ref >39.00)
LDL Cholesterol: 92 mg/dL (ref 0–99)
NonHDL: 105.65
Total CHOL/HDL Ratio: 3
Triglycerides: 69 mg/dL (ref 0.0–149.0)
VLDL: 13.8 mg/dL (ref 0.0–40.0)

## 2023-07-21 LAB — COMPREHENSIVE METABOLIC PANEL
ALT: 21 U/L (ref 0–53)
AST: 16 U/L (ref 0–37)
Albumin: 4.5 g/dL (ref 3.5–5.2)
Alkaline Phosphatase: 58 U/L (ref 39–117)
BUN: 17 mg/dL (ref 6–23)
CO2: 28 meq/L (ref 19–32)
Calcium: 9.6 mg/dL (ref 8.4–10.5)
Chloride: 103 meq/L (ref 96–112)
Creatinine, Ser: 1.09 mg/dL (ref 0.40–1.50)
GFR: 79.01 mL/min (ref >60.00)
Glucose, Bld: 104 mg/dL — ABNORMAL HIGH (ref 70–99)
Potassium: 4.8 meq/L (ref 3.5–5.1)
Sodium: 140 meq/L (ref 135–145)
Total Bilirubin: 0.9 mg/dL (ref 0.2–1.2)
Total Protein: 7.4 g/dL (ref 6.0–8.3)

## 2023-07-21 LAB — CBC
HCT: 49.1 % (ref 39.0–52.0)
Hemoglobin: 16.4 g/dL (ref 13.0–17.0)
MCHC: 33.4 g/dL (ref 30.0–36.0)
MCV: 93.5 fL (ref 78.0–100.0)
Platelets: 291 10*3/uL (ref 150.0–400.0)
RBC: 5.26 Mil/uL (ref 4.22–5.81)
RDW: 13.5 % (ref 11.5–15.5)
WBC: 7 10*3/uL (ref 4.0–10.5)

## 2023-07-21 LAB — TSH: TSH: 1.44 u[IU]/mL (ref 0.35–5.50)

## 2023-07-21 LAB — HEMOGLOBIN A1C: Hgb A1c MFr Bld: 6 % (ref 4.6–6.5)

## 2023-07-21 MED ORDER — SILDENAFIL CITRATE 50 MG PO TABS: 25.0000 mg | ORAL_TABLET | Freq: Every day | ORAL | 6 refills | Status: DC | PRN

## 2023-07-21 NOTE — Progress Notes (Signed)
 Subjective:  Patient ID: James Jordan, male    DOB: 28-Jan-1971  Age: 53 y.o. MRN: [id number]  CC:  Chief Complaint  Patient presents with   Annual Exam    HPI James Jordan presents for Annual Exam PCP, me Hematology Dr. Lonn Cardiology, Dr. Dann, history of PACs, palpitations.  Doing well. No hew health concerns.   Elevated blood pressure Noted at hematology.  No current antihypertensives.  Improved in office today.  Weight has improved from October. No home readings.  Unsure if he snores. Frequent nocturnal wakening. Some daytime fatigue, occasional naps. No prior OSA testing.  No recent palpitations.  BP Readings from Last 3 Encounters:  07/21/23 132/82  04/08/23 (!) 155/91  03/31/23 (!) 136/100   Wt Readings from Last 3 Encounters:  07/21/23 (!) 309 lb 6 oz (140.3 kg)  04/08/23 (!) 313 lb 3.2 oz (142.1 kg)  03/31/23 (!) 314 lb (142.4 kg)   Allergic rhinitis Flonase  as needed.  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  DVT/PE in 2022.  Hematology Dr. Lonn.  Note reviewed from 04/08/2023.  Plan for indefinite treatment with reduced dose Eliquis , no bleeding complications.  1 year follow-up.  Erectile dysfunction Treated Viagra  50 mg.  Effective when used - infrequent use.  Denies headache, flushing, chest pain or shortness of breath with exercise, vision or hearing changes.  Not sure he is taking it soon enough prior to sexual activity to see maximal benefit.     07/21/2023    9:36 AM 03/07/2023   11:24 AM 04/05/2022    3:37 PM 11/30/2021    1:41 PM 08/27/2021   12:17 PM  Depression screen PHQ 2/9  Decreased Interest 0 0 0 0 0  Down, Depressed, Hopeless 0 0 0 0 0  PHQ - 2 Score 0 0 0 0 0  Altered sleeping 3 1 0    Tired, decreased energy 0 1 0    Change in appetite 0 0 0    Feeling bad or failure about yourself  0 0 0    Trouble concentrating 0 0 0    Moving slowly or fidgety/restless 0 0 0     Suicidal thoughts 0 0 0    PHQ-9 Score 3 2 0    Difficult doing work/chores Not difficult at all Not difficult at all       Health Maintenance  Topic Date Due   COVID-19 Vaccine (1) Never done   HIV Screening  Never done   Hepatitis C Screening  Never done   DTaP/Tdap/Td (1 - Tdap) Never done   Zoster Vaccines- Shingrix (1 of 2) Never done   Colonoscopy  Never done   INFLUENZA VACCINE  09/12/2023 (Originally 01/13/2023)   HPV VACCINES  Aged Out  Screening options with colonoscopy versus Cologuard discussed. Discussed timing of repeat testing intervals if normal, as well as potential need for diagnostic Colonoscopy if positive Cologuard. Understanding expressed, and chose Cologuard.  No FH of colon CA, personal polyps or bleeding. Prostate: does not have family history of prostate cancer The natural history of prostate cancer and ongoing controversy regarding screening and potential treatment outcomes of prostate cancer has been discussed with the patient. The meaning of a false positive PSA and a false negative PSA has been discussed. He indicates understanding of the limitations of this screening test and wishes NOT to proceed with screening PSA testing. No results found for: PSA1, PSA   There  is no immunization history on file for this patient. Flu vaccine, covid, shingles, tdap declined.    No results found. Wears glasses - appt in June 2024 - no changes.   Dental: due. Dental implants. Yearly visit.   Alcohol:none  Tobacco: dip - occasional. Trying to quit.   Exercise: active with work.  Weight improving. Improving with diet choices.  Wt Readings from Last 3 Encounters:  07/21/23 (!) 309 lb 6 oz (140.3 kg)  04/08/23 (!) 313 lb 3.2 oz (142.1 kg)  03/31/23 (!) 314 lb (142.4 kg)   Body mass index is 38.41 kg/m.    History Patient Active Problem List   Diagnosis Date Noted   Elevated BP without diagnosis of hypertension 04/08/2023   Bilateral lower extremity  edema 06/02/2021   Bilateral pulmonary embolism (HCC) 04/05/2021   History of DVT (deep vein thrombosis)    PAC (premature atrial contraction) 06/01/2016   Obesity, Class II, BMI 35-39.9 04/01/2016   Past Medical History:  Diagnosis Date   ACL tear - left knee 2002   Per patient, not repaired    Cancer (HCC)    MALT lymphoma right eye   Complication of anesthesia    DVT (deep venous thrombosis) (HCC)    History of pulmonary embolus (PE)    Hypertension    Umbilical hernia 2003   Past Surgical History:  Procedure Laterality Date   EYE SURGERY Right 03/01/2022   cataract   EYE SURGERY Right 2020   MALT lymphoma surgery   HERNIA REPAIR  2004   Umbilical   TONSILLECTOMY     No Known Allergies Prior to Admission medications   Medication Sig Start Date End Date Taking? Authorizing Provider  apixaban  (ELIQUIS ) 2.5 MG TABS tablet Take 1 tablet (2.5 mg total) by mouth 2 (two) times daily. 04/08/23  Yes Gorsuch, Almarie, MD  fluticasone  (FLONASE ) 50 MCG/ACT nasal spray Place 2 sprays into both nostrils daily. Patient taking differently: Place 2 sprays into both nostrils daily. As needed 03/31/23  Yes Levora James SAUNDERS, MD  sildenafil  (VIAGRA ) 50 MG tablet Take 0.5-1 tablets (25-50 mg total) by mouth daily as needed for erectile dysfunction. 11/30/21  Yes Levora James SAUNDERS, MD   Social History   Socioeconomic History   Marital status: Married    Spouse name: Not on file   Number of children: Not on file   Years of education: Not on file   Highest education level: Some college, no degree  Occupational History   Occupation: warehouse  Tobacco Use   Smoking status: Never    Passive exposure: Never   Smokeless tobacco: Former    Types: Snuff  Vaping Use   Vaping status: Never Used  Substance and Sexual Activity   Alcohol use: No   Drug use: No   Sexual activity: Yes  Other Topics Concern   Not on file  Social History Narrative   Lives with his wife.   Social Drivers of Manufacturing Engineer Strain: Low Risk  (07/17/2023)   Overall Financial Resource Strain (CARDIA)    Difficulty of Paying Living Expenses: Not hard at all  Food Insecurity: No Food Insecurity (07/17/2023)   Hunger Vital Sign    Worried About Running Out of Food in the Last Year: Never true    Ran Out of Food in the Last Year: Never true  Transportation Needs: No Transportation Needs (07/17/2023)   PRAPARE - Administrator, Civil Service (Medical): No  Lack of Transportation (Non-Medical): No  Physical Activity: Sufficiently Active (07/17/2023)   Exercise Vital Sign    Days of Exercise per Week: 5 days    Minutes of Exercise per Session: 30 min  Stress: No Stress Concern Present (07/17/2023)   [employer] of Occupational Health - Occupational Stress Questionnaire    Feeling of Stress : Not at all  Social Connections: Moderately Integrated (07/17/2023)   Social Connection and Isolation Panel [NHANES]    Frequency of Communication with Friends and Family: More than three times a week    Frequency of Social Gatherings with Friends and Family: Once a week    Attends Religious Services: More than 4 times per year    Active Member of [employer] or Organizations: No    Attends Engineer, Structural: Not on file    Marital Status: Married  Catering Manager Violence: Not on file    Review of Systems  13 point review of systems per patient health survey noted.  Negative other than as indicated above or in HPI.   Objective:   Vitals:   07/21/23 0926  BP: 132/82  Pulse: 74  Temp: 98.5 F (36.9 C)  TempSrc: Temporal  SpO2: 98%  Weight: (!) 309 lb 6 oz (140.3 kg)  Height: 6' 3.25 (1.911 m)     Physical Exam Vitals reviewed.  Constitutional:      Appearance: He is well-developed.  HENT:     Head: Normocephalic and atraumatic.     Right Ear: External ear normal.     Left Ear: External ear normal.  Eyes:     Conjunctiva/sclera: Conjunctivae normal.     Pupils:  Pupils are equal, round, and reactive to light.  Neck:     Thyroid : No thyromegaly.  Cardiovascular:     Rate and Rhythm: Normal rate and regular rhythm.     Heart sounds: Normal heart sounds.  Pulmonary:     Effort: Pulmonary effort is normal. No respiratory distress.     Breath sounds: Normal breath sounds. No wheezing.  Abdominal:     General: There is no distension.     Palpations: Abdomen is soft.     Tenderness: There is no abdominal tenderness.  Musculoskeletal:        General: No tenderness. Normal range of motion.     Cervical back: Normal range of motion and neck supple.  Lymphadenopathy:     Cervical: No cervical adenopathy.  Skin:    General: Skin is warm and dry.  Neurological:     Mental Status: He is alert and oriented to person, place, and time.     Deep Tendon Reflexes: Reflexes are normal and symmetric.  Psychiatric:        Behavior: Behavior normal.        Assessment & Plan:  MCDANIEL OHMS is a 53 y.o. male . Annual physical exam  - -anticipatory guidance as below in AVS, screening labs above. Health maintenance items as above in HPI discussed/recommended as applicable.  Vaccines declined as above.  Erectile dysfunction, unspecified erectile dysfunction type - Plan: sildenafil  (VIAGRA ) 50 MG tablet  -Tolerating Viagra .  Recommended taking earlier prior to sexual activity to see if that is more effective.  RTC precautions.  Frequent nocturnal awakening - Plan: Ambulatory referral to Sleep Studies Elevated blood pressure reading - Plan: Ambulatory referral to Sleep Studies  -Refer to sleep specialist, possible underlying OSA.  Blood pressure better today.  No new meds for now.  30-month follow-up  Screen for colon cancer - Plan: Cologuard  -Order placed and I reviewed the demonstration kit in office, all questions were answered.  Class 2 obesity with body mass index (BMI) of 38.0 to 38.9 in adult, unspecified obesity type, unspecified whether serious  comorbidity present  -Weight improving, continue to watch diet, food choices, recheck in 3 months.  Screening for diabetes mellitus - Plan: Hemoglobin A1c  Screening for thyroid  disorder - Plan: TSH  Screening for hyperlipidemia - Plan: Comprehensive metabolic panel, Lipid panel  Medication monitoring encounter - Plan: CBC  Hx of long-term (current) use of anticoagulants - Plan: CBC Continue on Eliquis  with history of DVT, PE, check CBC.  Denies any bleeding.  Meds ordered this encounter  Medications   sildenafil  (VIAGRA ) 50 MG tablet    Sig: Take 0.5-1 tablets (25-50 mg total) by mouth daily as needed for erectile dysfunction.    Dispense:  10 tablet    Refill:  6   Patient Instructions  No medication changes at this time.  Blood pressure is better here today but it has been elevated.  Keep an eye on that and if you do see higher readings, follow-up sooner but otherwise lets recheck in 3 months.  I will refer you to sleep specialist to to discuss possible testing for sleep apnea.  If any concerns on labs I will let you know.  Cologuard was ordered and should arrive to your home soon.  Let me know if there are questions with that test.  Continue to try to cut back on chewing tobacco.  If difficulties with cessation, please let me know and we can discuss ways to assist you.  Take care!  Preventive Care 12-6 Years Old, Male Preventive care refers to lifestyle choices and visits with your health care provider that can promote health and wellness. Preventive care visits are also called wellness exams. What can I expect for my preventive care visit? Counseling During your preventive care visit, your health care provider may ask about your: Medical history, including: Past medical problems. Family medical history. Current health, including: Emotional well-being. Home life and relationship well-being. Sexual activity. Lifestyle, including: Alcohol, nicotine or tobacco, and drug  use. Access to firearms. Diet, exercise, and sleep habits. Safety issues such as seatbelt and bike helmet use. Sunscreen use. Work and work astronomer. Physical exam Your health care provider will check your: Height and weight. These may be used to calculate your BMI (body mass index). BMI is a measurement that tells if you are at a healthy weight. Waist circumference. This measures the distance around your waistline. This measurement also tells if you are at a healthy weight and may help predict your risk of certain diseases, such as type 2 diabetes and high blood pressure. Heart rate and blood pressure. Body temperature. Skin for abnormal spots. What immunizations do I need?  Vaccines are usually given at various ages, according to a schedule. Your health care provider will recommend vaccines for you based on your age, medical history, and lifestyle or other factors, such as travel or where you work. What tests do I need? Screening Your health care provider may recommend screening tests for certain conditions. This may include: Lipid and cholesterol levels. Diabetes screening. This is done by checking your blood sugar (glucose) after you have not eaten for a while (fasting). Hepatitis B test. Hepatitis C test. HIV (human immunodeficiency virus) test. STI (sexually transmitted infection) testing, if you are at risk. Lung cancer screening. Prostate cancer screening. Colorectal  cancer screening. Talk with your health care provider about your test results, treatment options, and if necessary, the need for more tests. Follow these instructions at home: Eating and drinking  Eat a diet that includes fresh fruits and vegetables, whole grains, lean protein, and low-fat dairy products. Take vitamin and mineral supplements as recommended by your health care provider. Do not drink alcohol if your health care provider tells you not to drink. If you drink alcohol: Limit how much you have to  0-2 drinks a day. Know how much alcohol is in your drink. In the [place] one drink equals one 12 oz bottle of beer (355 mL), one 5 oz glass of wine (148 mL), or one 1 oz glass of hard liquor (44 mL). Lifestyle Brush your teeth every morning and night with fluoride toothpaste. Floss one time each day. Exercise for at least 30 minutes 5 or more days each week. Do not use any products that contain nicotine or tobacco. These products include cigarettes, chewing tobacco, and vaping devices, such as e-cigarettes. If you need help quitting, ask your health care provider. Do not use drugs. If you are sexually active, practice safe sex. Use a condom or other form of protection to prevent STIs. Take aspirin only as told by your health care provider. Make sure that you understand how much to take and what form to take. Work with your health care provider to find out whether it is safe and beneficial for you to take aspirin daily. Find healthy ways to manage stress, such as: Meditation, yoga, or listening to music. Journaling. Talking to a trusted person. Spending time with friends and family. Minimize exposure to UV radiation to reduce your risk of skin cancer. Safety Always wear your seat belt while driving or riding in a vehicle. Do not drive: If you have been drinking alcohol. Do not ride with someone who has been drinking. When you are tired or distracted. While texting. If you have been using any mind-altering substances or drugs. Wear a helmet and other protective equipment during sports activities. If you have firearms in your house, make sure you follow all gun safety procedures. What's next? Go to your health care provider once a year for an annual wellness visit. Ask your health care provider how often you should have your eyes and teeth checked. Stay up to date on all vaccines. This information is not intended to replace advice given to you by your health care provider. Make sure you  discuss any questions you have with your health care provider. Document Revised: 11/26/2020 Document Reviewed: 11/26/2020 Elsevier Patient Education  2024 Elsevier Inc.    Signed,   James Pines, MD [facility], [facility] 07/21/23 10:22 AM

## 2023-07-21 NOTE — Patient Instructions (Addendum)
 No medication changes at this time.  Blood pressure is better here today but it has been elevated.  Keep an eye on that and if you do see higher readings, follow-up sooner but otherwise lets recheck in 3 months.  I will refer you to sleep specialist to to discuss possible testing for sleep apnea.  If any concerns on labs I will let you know.  Cologuard was ordered and should arrive to your home soon.  Let me know if there are questions with that test.  Continue to try to cut back on chewing tobacco.  If difficulties with cessation, please let me know and we can discuss ways to assist you.  Take care!  Vaccines declined Preventive Care 41-79 Years Old, Male Preventive care refers to lifestyle choices and visits with your health care provider that can promote health and wellness. Preventive care visits are also called wellness exams. What can I expect for my preventive care visit? Counseling During your preventive care visit, your health care provider may ask about your: Medical history, including: Past medical problems. Family medical history. Current health, including: Emotional well-being. Home life and relationship well-being. Sexual activity. Lifestyle, including: Alcohol, nicotine or tobacco, and drug use. Access to firearms. Diet, exercise, and sleep habits. Safety issues such as seatbelt and bike helmet use. Sunscreen use. Work and work astronomer. Physical exam Your health care provider will check your: Height and weight. These may be used to calculate your BMI (body mass index). BMI is a measurement that tells if you are at a healthy weight. Waist circumference. This measures the distance around your waistline. This measurement also tells if you are at a healthy weight and may help predict your risk of certain diseases, such as type 2 diabetes and high blood pressure. Heart rate and blood pressure. Body temperature. Skin for abnormal spots. What immunizations do I  need?  Vaccines are usually given at various ages, according to a schedule. Your health care provider will recommend vaccines for you based on your age, medical history, and lifestyle or other factors, such as travel or where you work. What tests do I need? Screening Your health care provider may recommend screening tests for certain conditions. This may include: Lipid and cholesterol levels. Diabetes screening. This is done by checking your blood sugar (glucose) after you have not eaten for a while (fasting). Hepatitis B test. Hepatitis C test. HIV (human immunodeficiency virus) test. STI (sexually transmitted infection) testing, if you are at risk. Lung cancer screening. Prostate cancer screening. Colorectal cancer screening. Talk with your health care provider about your test results, treatment options, and if necessary, the need for more tests. Follow these instructions at home: Eating and drinking  Eat a diet that includes fresh fruits and vegetables, whole grains, lean protein, and low-fat dairy products. Take vitamin and mineral supplements as recommended by your health care provider. Do not drink alcohol if your health care provider tells you not to drink. If you drink alcohol: Limit how much you have to 0-2 drinks a day. Know how much alcohol is in your drink. In the [place] one drink equals one 12 oz bottle of beer (355 mL), one 5 oz glass of wine (148 mL), or one 1 oz glass of hard liquor (44 mL). Lifestyle Brush your teeth every morning and night with fluoride toothpaste. Floss one time each day. Exercise for at least 30 minutes 5 or more days each week. Do not use any products that contain nicotine or tobacco. These  products include cigarettes, chewing tobacco, and vaping devices, such as e-cigarettes. If you need help quitting, ask your health care provider. Do not use drugs. If you are sexually active, practice safe sex. Use a condom or other form of protection to prevent  STIs. Take aspirin only as told by your health care provider. Make sure that you understand how much to take and what form to take. Work with your health care provider to find out whether it is safe and beneficial for you to take aspirin daily. Find healthy ways to manage stress, such as: Meditation, yoga, or listening to music. Journaling. Talking to a trusted person. Spending time with friends and family. Minimize exposure to UV radiation to reduce your risk of skin cancer. Safety Always wear your seat belt while driving or riding in a vehicle. Do not drive: If you have been drinking alcohol. Do not ride with someone who has been drinking. When you are tired or distracted. While texting. If you have been using any mind-altering substances or drugs. Wear a helmet and other protective equipment during sports activities. If you have firearms in your house, make sure you follow all gun safety procedures. What's next? Go to your health care provider once a year for an annual wellness visit. Ask your health care provider how often you should have your eyes and teeth checked. Stay up to date on all vaccines. This information is not intended to replace advice given to you by your health care provider. Make sure you discuss any questions you have with your health care provider. Document Revised: 11/26/2020 Document Reviewed: 11/26/2020 Elsevier Patient Education  2024 [employer].

## 2023-07-24 ENCOUNTER — Encounter: Payer: Self-pay | Admitting: Family Medicine

## 2023-08-12 LAB — COLOGUARD: COLOGUARD: NEGATIVE

## 2023-08-18 ENCOUNTER — Ambulatory Visit: Payer: Managed Care, Other (non HMO) | Admitting: Neurology

## 2023-08-18 ENCOUNTER — Encounter: Payer: Self-pay | Admitting: Neurology

## 2023-08-18 VITALS — BP 130/78 | HR 80 | Ht 74.0 in | Wt 310.0 lb

## 2023-08-18 DIAGNOSIS — E669 Obesity, unspecified: Secondary | ICD-10-CM | POA: Diagnosis not present

## 2023-08-18 DIAGNOSIS — I491 Atrial premature depolarization: Secondary | ICD-10-CM

## 2023-08-18 DIAGNOSIS — Z9189 Other specified personal risk factors, not elsewhere classified: Secondary | ICD-10-CM | POA: Diagnosis not present

## 2023-08-18 DIAGNOSIS — G4719 Other hypersomnia: Secondary | ICD-10-CM | POA: Diagnosis not present

## 2023-08-18 DIAGNOSIS — R600 Localized edema: Secondary | ICD-10-CM

## 2023-08-18 DIAGNOSIS — I2699 Other pulmonary embolism without acute cor pulmonale: Secondary | ICD-10-CM

## 2023-08-18 DIAGNOSIS — Z86718 Personal history of other venous thrombosis and embolism: Secondary | ICD-10-CM

## 2023-08-18 NOTE — Progress Notes (Signed)
 Subjective:    Patient ID: James Jordan is a 53 y.o. male.  HPI    Huston Foley, MD, PhD [facility] [address], [address] [address] [place], [place] [address]  Dear Dr. Neva Seat,  I saw your patient, James Jordan, upon your kind request in my sleep clinic today for initial consultation of his sleep disorder, in particular, concern for underlying obstructive sleep apnea.  The patient is unaccompanied today.  As you know, [name] is a 53 year old male with an underlying medical history of lymphoma of the right eye, history of DVT and PE, on Eliquis, ED, history of left knee ACL tear, and obesity, who reports some sleep disruption and some daytime tiredness, was encouraged to seek evaluation for sleep apnea by his hematologist.  He reports that in the past he had snoring and gasping for air.  He reports that lately he has not had any snoring.  His Epworth sleepiness score is 9 out of 24, fatigue severity score is 24 out of 63.  I reviewed your office note from 07/21/2023.  He lives with his wife and 2 children, ages 79 and 57.  They have 2 dogs in the household.  The dogs do not sleep in the bedroom.  Does have a TV in the bedroom but it is typically not on at night.  Has nocturia about once or twice per average night.  Bedtime is between 8:30 PM and 9 and rise time around 4.  He works in a Naval architect.  He reports still getting adjusted to his new cyst.  He denies recurrent headaches.  Drinks 1 cup of in the morning.  He is a non-smoker and does not drink alcohol.  He reports a family history of alcoholism.  He has lower extremity swelling and had a DVT in the left leg.  His Past Medical History Is Significant For: Past Medical History:  Diagnosis Date   ACL tear - left knee 2002   Per patient, not repaired    Cancer (HCC)    MALT lymphoma right eye   Complication of anesthesia    DVT (deep venous thrombosis) (HCC)    History of pulmonary embolus (PE)     Hypertension    Umbilical hernia 2003    His Past Surgical History Is Significant For: Past Surgical History:  Procedure Laterality Date   EYE SURGERY Right 03/01/2022   cataract   EYE SURGERY Right 2020   MALT lymphoma surgery   HERNIA REPAIR  2004   Umbilical   TONSILLECTOMY      His Family History Is Significant For: Family History  Problem Relation Age of Onset   Diabetes Mother    Heart disease Maternal Aunt    Diabetes Maternal Uncle    Diabetes Maternal Grandfather    Lung cancer Paternal Grandmother    Lung cancer Paternal Grandfather     His Social History Is Significant For: Social History   Socioeconomic History   Marital status: Married    Spouse name: Not on file   Number of children: Not on file   Years of education: Not on file   Highest education level: Some college, no degree  Occupational History   Occupation: Naval architect  Tobacco Use   Smoking status: Never    Passive exposure: Never   Smokeless tobacco: Former    Types: Snuff  Vaping Use   Vaping status: Never Used  Substance and Sexual Activity   Alcohol use: No   Drug  use: No   Sexual activity: Yes  Other Topics Concern   Not on file  Social History Narrative   Lives with his wife.   Social Drivers of Corporate investment banker Strain: Low Risk  (07/17/2023)   Overall Financial Resource Strain (CARDIA)    Difficulty of Paying Living Expenses: Not hard at all  Food Insecurity: No Food Insecurity (07/17/2023)   Hunger Vital Sign    Worried About Running Out of Food in the Last Year: Never true    Ran Out of Food in the Last Year: Never true  Transportation Needs: No Transportation Needs (07/17/2023)   PRAPARE - Administrator, Civil Service (Medical): No    Lack of Transportation (Non-Medical): No  Physical Activity: Sufficiently Active (07/17/2023)   Exercise Vital Sign    Days of Exercise per Week: 5 days    Minutes of Exercise per Session: 30 min  Stress: No Stress  Concern Present (07/17/2023)   [employer] of Occupational Health - Occupational Stress Questionnaire    Feeling of Stress : Not at all  Social Connections: Moderately Integrated (07/17/2023)   Social Connection and Isolation Panel [NHANES]    Frequency of Communication with Friends and Family: More than three times a week    Frequency of Social Gatherings with Friends and Family: Once a week    Attends Religious Services: More than 4 times per year    Active Member of [employer] or Organizations: No    Attends Engineer, structural: Not on file    Marital Status: Married    His Allergies Are:  No Known Allergies:   His Current Medications Are:  Outpatient Encounter Medications as of 08/18/2023  Medication Sig   apixaban (ELIQUIS) 2.5 MG TABS tablet Take 1 tablet (2.5 mg total) by mouth 2 (two) times daily.   fluticasone (FLONASE) 50 MCG/ACT nasal spray Place 2 sprays into both nostrils daily. (Patient not taking: Reported on 08/18/2023)   sildenafil (VIAGRA) 50 MG tablet Take 0.5-1 tablets (25-50 mg total) by mouth daily as needed for erectile dysfunction. (Patient not taking: Reported on 08/18/2023)   No facility-administered encounter medications on file as of 08/18/2023.  :   Review of Systems:  Out of a complete 14 point review of systems, all are reviewed and negative with the exception of these symptoms as listed below:  Review of Systems  Neurological:        Patient in room #9 and alone. Patient states he does wake in the middle of the night but it not hard to fall back to sleep. Patient states he believe its his weight that causes his sleeping issues. ESS-9 & FSS- 24    Objective:  Neurological Exam  Physical Exam Physical Examination:   Vitals:   08/18/23 0905  BP: 130/78  Pulse: 80    General Examination: The patient is a very pleasant 53 y.o. male in no acute distress. He appears well-developed and well-nourished and well groomed.   HEENT: Normocephalic,  atraumatic, pupils are equal, round and reactive to light, extraocular tracking is good without limitation to gaze excursion or nystagmus noted. Hearing is grossly intact. Face is symmetric with normal facial animation. Speech is clear with no dysarthria noted. There is no hypophonia. There is no lip, neck/head, jaw or voice tremor. Neck is supple with full range of passive and active motion. There are no carotid bruits on auscultation. Oropharynx exam reveals: mild mouth dryness, adequate dental hygiene and moderate airway  crowding, due to Mallampati class III, smaller airway entry.  Tongue protrudes centrally and palate elevates symmetrically, neck circumference 18 inches.  Mild overbite noted.  Chest: Clear to auscultation without wheezing, rhonchi or crackles noted.  Heart: S1+S2+0, regular and normal without murmurs, rubs or gallops noted.   Abdomen: Soft, non-tender and non-distended.  Extremities: There is 1+ pitting edema in the distal lower extremities bilaterally.  Distal lower extremity erythema noted bilaterally.  Skin: Warm and dry without trophic changes noted.   Musculoskeletal: exam reveals no obvious joint deformities.   Neurologically:  Mental status: The patient is awake, alert and oriented in all 4 spheres. His immediate and remote memory, attention, language skills and fund of knowledge are appropriate. There is no evidence of aphasia, agnosia, apraxia or anomia. Speech is clear with normal prosody and enunciation. Thought process is linear. Mood is normal and affect is normal.  Cranial nerves II - XII are as described above under HEENT exam.  Motor exam: Normal bulk, strength and tone is noted. There is no obvious action or resting tremor.  Fine motor skills and coordination: grossly intact.  Cerebellar testing: No dysmetria or intention tremor. There is no truncal or gait ataxia.  Sensory exam: intact to light touch in the upper and lower extremities.  Gait, station and  balance: He stands easily. No veering to one side is noted. No leaning to one side is noted. Posture is age-appropriate and stance is narrow based. Gait shows normal stride length and normal pace. No problems turning are noted.   Assessment and plan:  In summary, DARRY KELNHOFER is a very pleasant 53 y.o.-year old male 53 year old male with an underlying medical history of lymphoma of the right eye, history of DVT and PE, on Eliquis, ED, history of left knee ACL tear, and obesity, whose history and physical exam are concerning for sleep disordered breathing, particularly obstructive sleep apnea (OSA).  While a laboratory attended sleep study is typically considered "gold standard" for evaluation of sleep disordered breathing, he would like to proceed with a home sleep test at this time.   I had a long chat with the patient about my findings and the diagnosis of sleep apnea, particularly OSA, its prognosis and treatment options. We talked about medical/conservative treatments, surgical interventions and non-pharmacological approaches for symptom control. I explained, in particular, the risks and ramifications of untreated moderate to severe OSA, especially with respect to developing cardiovascular disease down the road, including congestive heart failure (CHF), difficult to treat hypertension, cardiac arrhythmias (particularly A-fib), neurovascular complications including TIA, stroke and dementia. Even type 2 diabetes has, in part, been linked to untreated OSA. Symptoms of untreated OSA may include (but may not be limited to) daytime sleepiness, nocturia (i.e. frequent nighttime urination), memory problems, mood irritability and suboptimally controlled or worsening mood disorder such as depression and/or anxiety, lack of energy, lack of motivation, physical discomfort, as well as recurrent headaches, especially morning or nocturnal headaches. We talked about the importance of maintaining a healthy lifestyle and  striving for healthy weight. In addition, we talked about the importance of striving for and maintaining good sleep hygiene. I recommended a sleep study at this time. I outlined the differences between a laboratory attended sleep study which is considered more comprehensive and accurate over the option of a home sleep test (HST); the latter may lead to underestimation of sleep disordered breathing in some instances and does not help with diagnosing upper airway resistance syndrome and is not accurate enough  to diagnose primary central sleep apnea typically. I outlined possible surgical and non-surgical treatment options of OSA, including the use of a positive airway pressure (PAP) device (i.e. CPAP, AutoPAP/APAP or BiPAP in certain circumstances), a custom-made dental device (aka oral appliance, which would require a referral to a specialist dentist or orthodontist typically, and is generally speaking not considered for patients with full dentures or edentulous state), upper airway surgical options, such as traditional UPPP (which is not considered a first-line treatment) or the Inspire device (hypoglossal nerve stimulator, which would involve a referral for consultation with an ENT surgeon, after careful selection, following inclusion criteria - also not first-line treatment). I explained the PAP treatment option to the patient in detail, as this is generally considered first-line treatment.  The patient indicated that he would be willing to try PAP therapy, if the need arises. I explained the importance of being compliant with PAP treatment, not only for insurance purposes but primarily to improve patient's symptoms symptoms, and for the patient's long term health benefit, including to reduce His cardiovascular risks longer-term.    We will pick up our discussion about the next steps and treatment options after testing.  We will keep him posted as to the test results by phone call and/or MyChart messaging  where possible.  We will plan to follow-up in sleep clinic accordingly as well.  I answered all his questions today and the patient was in agreement.   I encouraged him to call with any interim questions, concerns, problems or updates or email [place] through MyChart.  Generally speaking, sleep test authorizations may take up to 2 weeks, sometimes less, sometimes longer, the patient is encouraged to get in touch with [place] if they do not hear back from the sleep lab staff directly within the next 2 weeks.  Thank you very much for allowing me to participate in the care of this nice patient. If I can be of any further assistance to you please do not hesitate to call me at [phone].  Sincerely,   Huston Foley, MD, PhD

## 2023-08-18 NOTE — Patient Instructions (Signed)

## 2023-09-02 ENCOUNTER — Ambulatory Visit (INDEPENDENT_AMBULATORY_CARE_PROVIDER_SITE_OTHER): Admitting: Neurology

## 2023-09-02 DIAGNOSIS — Z86718 Personal history of other venous thrombosis and embolism: Secondary | ICD-10-CM

## 2023-09-02 DIAGNOSIS — G4733 Obstructive sleep apnea (adult) (pediatric): Secondary | ICD-10-CM

## 2023-09-02 DIAGNOSIS — I491 Atrial premature depolarization: Secondary | ICD-10-CM

## 2023-09-02 DIAGNOSIS — R600 Localized edema: Secondary | ICD-10-CM

## 2023-09-02 DIAGNOSIS — Z9189 Other specified personal risk factors, not elsewhere classified: Secondary | ICD-10-CM

## 2023-09-02 DIAGNOSIS — G4719 Other hypersomnia: Secondary | ICD-10-CM

## 2023-09-02 DIAGNOSIS — E669 Obesity, unspecified: Secondary | ICD-10-CM

## 2023-09-02 DIAGNOSIS — I2699 Other pulmonary embolism without acute cor pulmonale: Secondary | ICD-10-CM

## 2023-09-20 NOTE — Progress Notes (Unsigned)
 Marland Kitchen

## 2023-09-21 NOTE — Procedures (Signed)
 [facility]  HOME SLEEP TEST (SANSA) REPORT (Mail-Out Device):   STUDY DATE: 09/11/2023  DOB: [birth year]  MRN: [id number]  ORDERING CLINICIAN: Huston Foley, MD, PhD   REFERRING CLINICIAN: Shade Flood, MD   CLINICAL INFORMATION/HISTORY: 53 year old male with an underlying medical history of lymphoma of the right eye, history of DVT and PE, on Eliquis, ED, history of left knee ACL tear, and obesity, who reports some sleep disruption and some daytime tiredness, he was encouraged to seek evaluation for sleep apnea by his hematologist. He reports that in the past he had snoring and gasping for air.   PATIENT'S LAST REPORTED EPWORTH SLEEPINESS SCORE (ESS): 9/24.  BMI (at the time of sleep clinic visit and/or test date): 39.8 kg/m  FINDINGS:   Study Protocol:    The SANSA single-point-of-skin-contact chest-worn sensor - an FDA cleared and DOT approved type 4 home sleep test device - measures eight physiological channels,  including blood oxygen saturation (measured via PPG [photoplethysmography]), EKG-derived heart rate, respiratory effort, chest movement (measured via accelerometer), snoring, body position, and actigraphy. The device is designed to be worn for up to 10 hours per study.   Sleep Summary:   Total Recording Time (hours, min): 7 hours, 33 min  Total Effective Sleep Time (hours, min):  6 hours, 25 min  Sleep Efficiency (%):    85%   Respiratory Indices:   Calculated sAHI (per hour):  17/hour         Oxygen Saturation Statistics:    Oxygen Saturation (%) Mean: 92%   Minimum oxygen saturation (%):                 68.6%   O2 Saturation Range (%): 68.6-98.8%   Time below or at 88% saturation: 8 min   Pulse Rate Statistics:   Pulse Mean (bpm):    60/min    Pulse Range (49 - 101/min)   Snoring: Mild to moderate  IMPRESSION/DIAGNOSES:   OSA (obstructive sleep apnea), moderate   RECOMMENDATIONS:   This home sleep test  demonstrates moderate obstructive sleep apnea with a total AHI of 17/hour and O2 nadir of 68.6%.  Mild to moderate snoring was detected. Treatment with a positive airway pressure (PAP) device is recommended. The patient will be advised to proceed with an autoPAP titration/trial at home for now. A full night titration study may be considered to optimize treatment settings, monitor proper oxygen saturations and aid with improvement of tolerance and adherence, if needed down the road. Alternative treatment options may include a dental device through dentistry or orthodontics in selected patients or Inspire (hypoglossal nerve stimulator) in carefully selected patients (meeting inclusion criteria).  Concomitant weight loss is recommended (where clinically appropriate). Please note that untreated obstructive sleep apnea may carry additional perioperative morbidity. Patients with significant obstructive sleep apnea should receive perioperative PAP therapy and the surgeons and particularly the anesthesiologist should be informed of the diagnosis and the severity of the sleep disordered breathing. The patient should be cautioned not to drive, work at heights, or operate dangerous or heavy equipment when tired or sleepy. Review and reiteration of good sleep hygiene measures should be pursued with any patient. Other causes of the patient's symptoms, including circadian rhythm disturbances, an underlying mood disorder, medication effect and/or an underlying medical problem cannot be ruled out based on this test. Clinical correlation is recommended.  The patient and his referring provider will be notified of the test results. The patient will be seen in follow  up in sleep clinic at [facility].  I certify that I have reviewed the raw data recording prior to the issuance of this report in accordance with the standards of the American Academy of Sleep Medicine (AASM).   INTERPRETING PHYSICIAN:   Huston Foley, MD, PhD Medical  Director, [facility]) Diplomat, ABPN (Neurology and Sleep)   [facility] [address] [place], [place] [address] [phone]

## 2023-09-21 NOTE — Addendum Note (Signed)
 Addended by: Huston Foley on: 09/21/2023 05:42 PM   Modules accepted: Orders

## 2023-09-27 ENCOUNTER — Telehealth: Payer: Self-pay

## 2023-09-27 NOTE — Telephone Encounter (Addendum)
 Called pt and relayed the below Results per Dr. Omar Bibber.   ----- Message from Debbra Fairy sent at 09/21/2023  5:42 PM EDT ----- Patient referred by PCP, seen by me on 08/18/23, patient had a HST on 09/11/23.    Please call and notify the patient that the recent home sleep test showed obstructive sleep apnea in the moderate range. I recommend treatment in the form of autoPAP, which means, that we don't have to bring him in for a sleep study with CPAP, but will let him start using a so called autoPAP machine at home, which is a CPAP-like machine with self-adjusting pressures. We will send the order to a local DME company (of his choice, or as per insurance requirement). The DME representative will fit him with a mask, educate him on how to use the machine, how to put the mask on, etc. I have placed an order in the chart. Please send the order, talk to patient, send report to referring MD. We will need a FU in sleep clinic for 10 weeks post-PAP set up, please arrange that with me or one of our NPs. Also reinforce the need for compliance with treatment. Thanks,   Debbra Fairy, MD, PhD [facility])

## 2023-10-20 ENCOUNTER — Encounter: Payer: Self-pay | Admitting: Family Medicine

## 2023-10-20 ENCOUNTER — Ambulatory Visit: Payer: Managed Care, Other (non HMO) | Admitting: Family Medicine

## 2023-10-20 VITALS — BP 126/74 | HR 69 | Temp 98.1°F | Ht 74.0 in | Wt 308.2 lb

## 2023-10-20 DIAGNOSIS — R030 Elevated blood-pressure reading, without diagnosis of hypertension: Secondary | ICD-10-CM | POA: Diagnosis not present

## 2023-10-20 DIAGNOSIS — G4733 Obstructive sleep apnea (adult) (pediatric): Secondary | ICD-10-CM | POA: Diagnosis not present

## 2023-10-20 NOTE — Patient Instructions (Signed)
 Thanks for coming today.  Keep follow-up for the AutoPap machine next week, I think that we will help quite a bit and you should feel better with use of the CPAP.  Blood pressure looks better here today, no new meds needed at this time.  Follow-up with me in 3 months and we can recheck labs at that time.  Continue to work on diet, great job on cutting back on late meals and sodas.  I suspect that will make a difference with your labs in a few months.  Take care and happy early birthday!

## 2023-10-20 NOTE — Progress Notes (Signed)
 Subjective:  Patient ID: James Jordan, male    DOB: 05-18-1971  Age: 53 y.o. MRN: [id number]  CC:  Chief Complaint  Patient presents with   Medical Management of Chronic Issues    Pt is well, no questions     HPI James Jordan presents for  Follow-up from physical, other concerns in February.  Elevated blood pressure with frequent nocturnal awakening Recommended continue monitoring of blood pressure, no new meds started at his last visit, did refer to sleep specialist to evaluate for possible underlying sleep apnea.    He was evaluated by sleep specialist March 6,Testing on March 21 indicated moderate OSA with AHI of 17/h and O2 nadir of 68.6%.  Mild to moderate snoring.  Started on AutoPap with plan for 10-week post PAP set up follow-up in office. Appt to setup Autopap next week.  Cutting back on late eating- trying to decrease the late meals.  Cut out sodas. More water, some sports drinks.  Compression socks help swelling.  No home BP readings.   BP 136/69 at [facility] appt this am.  BP Readings from Last 3 Encounters:  10/20/23 126/74  08/18/23 130/78  07/21/23 132/82   Wt Readings from Last 3 Encounters:  10/20/23 (!) 308 lb 3.2 oz (139.8 kg)  08/18/23 (!) 310 lb (140.6 kg)  07/21/23 (!) 309 lb 6 oz (140.3 kg)       History Patient Active Problem List   Diagnosis Date Noted   Elevated BP without diagnosis of hypertension 04/08/2023   Bilateral lower extremity edema 06/02/2021   Bilateral pulmonary embolism (HCC) 04/05/2021   History of DVT (deep vein thrombosis)    PAC (premature atrial contraction) 06/01/2016   Obesity, Class II, BMI 35-39.9 04/01/2016   Past Medical History:  Diagnosis Date   ACL tear - left knee 2002   Per patient, not repaired    Cancer (HCC)    MALT lymphoma right eye   Complication of anesthesia    DVT (deep venous thrombosis) (HCC)    History of pulmonary embolus (PE)    Hypertension    Umbilical hernia 2003   Past Surgical  History:  Procedure Laterality Date   EYE SURGERY Right 03/01/2022   cataract   EYE SURGERY Right 2020   MALT lymphoma surgery   HERNIA REPAIR  2004   Umbilical   TONSILLECTOMY     No Known Allergies Prior to Admission medications   Medication Sig Start Date End Date Taking? Authorizing Provider  apixaban  (ELIQUIS ) 2.5 MG TABS tablet Take 1 tablet (2.5 mg total) by mouth 2 (two) times daily. 04/08/23  Yes Gorsuch, Ni, MD  fluticasone  (FLONASE ) 50 MCG/ACT nasal spray Place 2 sprays into both nostrils daily. Patient not taking: Reported on 10/20/2023 03/31/23   Benjiman Bras, MD  sildenafil  (VIAGRA ) 50 MG tablet Take 0.5-1 tablets (25-50 mg total) by mouth daily as needed for erectile dysfunction. Patient not taking: Reported on 10/20/2023 07/21/23   Benjiman Bras, MD   Social History   Socioeconomic History   Marital status: Married    Spouse name: Not on file   Number of children: Not on file   Years of education: Not on file   Highest education level: Some college, no degree  Occupational History   Occupation: warehouse  Tobacco Use   Smoking status: Never    Passive exposure: Never   Smokeless tobacco: Former    Types: Snuff  Vaping Use   Vaping status: Never Used  Substance and Sexual Activity   Alcohol use: No   Drug use: No   Sexual activity: Yes  Other Topics Concern   Not on file  Social History Narrative   Lives with his wife.   Social Drivers of Corporate investment banker Strain: Low Risk  (07/17/2023)   Overall Financial Resource Strain (CARDIA)    Difficulty of Paying Living Expenses: Not hard at all  Food Insecurity: No Food Insecurity (07/17/2023)   Hunger Vital Sign    Worried About Running Out of Food in the Last Year: Never true    Ran Out of Food in the Last Year: Never true  Transportation Needs: No Transportation Needs (07/17/2023)   PRAPARE - Administrator, Civil Service (Medical): No    Lack of Transportation (Non-Medical): No   Physical Activity: Sufficiently Active (07/17/2023)   Exercise Vital Sign    Days of Exercise per Week: 5 days    Minutes of Exercise per Session: 30 min  Stress: No Stress Concern Present (07/17/2023)   [employer] of Occupational Health - Occupational Stress Questionnaire    Feeling of Stress : Not at all  Social Connections: Moderately Integrated (07/17/2023)   Social Connection and Isolation Panel [NHANES]    Frequency of Communication with Friends and Family: More than three times a week    Frequency of Social Gatherings with Friends and Family: Once a week    Attends Religious Services: More than 4 times per year    Active Member of [employer] or Organizations: No    Attends Engineer, structural: Not on file    Marital Status: Married  Catering manager Violence: Not on file    Review of Systems  Constitutional:  Negative for fatigue and unexpected weight change.  Eyes:  Negative for visual disturbance.  Respiratory:  Negative for cough, chest tightness and shortness of breath.   Cardiovascular:  Negative for chest pain, palpitations and leg swelling.  Gastrointestinal:  Negative for abdominal pain and blood in stool.  Neurological:  Negative for dizziness, light-headedness and headaches.     Objective:   Vitals:   10/20/23 1518  BP: 126/74  Pulse: 69  Temp: 98.1 F (36.7 C)  TempSrc: Temporal  SpO2: 97%  Weight: (!) 308 lb 3.2 oz (139.8 kg)  Height: 6\' 2"  (1.88 m)     Physical Exam Vitals reviewed.  Constitutional:      Appearance: He is well-developed.  HENT:     Head: Normocephalic and atraumatic.  Neck:     Vascular: No carotid bruit or JVD.  Cardiovascular:     Rate and Rhythm: Normal rate and regular rhythm.     Heart sounds: Normal heart sounds. No murmur heard. Pulmonary:     Effort: Pulmonary effort is normal.     Breath sounds: Normal breath sounds. No rales.  Abdominal:     Tenderness: There is no abdominal tenderness.  Musculoskeletal:      Right lower leg: No edema.     Left lower leg: No edema.  Skin:    General: Skin is warm and dry.  Neurological:     Mental Status: He is alert and oriented to person, place, and time.  Psychiatric:        Mood and Affect: Mood normal.     Assessment & Plan:  EYOEL DAWS is a 53 y.o. male . Elevated blood pressure reading  OSA (obstructive sleep apnea)  Blood pressure reading looks okay in office  today, no new meds at this time.  Recent diagnosis of moderate obstructive sleep apnea with plan for AutoPap starting next week.  That will likely have a beneficial effect on blood pressure.  Commended on his changes to diet, soda intake.  Anticipate this will help with weight as well as prediabetes, and can recheck labs in 3 months.  No new meds for now.  No orders of the defined types were placed in this encounter.  Patient Instructions  Thanks for coming today.  Keep follow-up for the AutoPap machine next week, I think that we will help quite a bit and you should feel better with use of the CPAP.  Blood pressure looks better here today, no new meds needed at this time.  Follow-up with me in 3 months and we can recheck labs at that time.  Continue to work on diet, great job on cutting back on late meals and sodas.  I suspect that will make a difference with your labs in a few months.  Take care and happy early birthday!    Signed,   Caro Christmas, MD [facility], [facility] 10/20/23 3:59 PM

## 2024-01-23 ENCOUNTER — Ambulatory Visit: Admitting: Family Medicine

## 2024-01-23 VITALS — BP 126/82 | HR 79 | Temp 98.1°F | Ht 75.0 in | Wt 313.6 lb

## 2024-01-23 DIAGNOSIS — G4733 Obstructive sleep apnea (adult) (pediatric): Secondary | ICD-10-CM

## 2024-01-23 DIAGNOSIS — Z5181 Encounter for therapeutic drug level monitoring: Secondary | ICD-10-CM

## 2024-01-23 DIAGNOSIS — R7303 Prediabetes: Secondary | ICD-10-CM | POA: Diagnosis not present

## 2024-01-23 DIAGNOSIS — Z7901 Long term (current) use of anticoagulants: Secondary | ICD-10-CM | POA: Diagnosis not present

## 2024-01-23 DIAGNOSIS — N529 Male erectile dysfunction, unspecified: Secondary | ICD-10-CM

## 2024-01-23 NOTE — Patient Instructions (Signed)
 Thanks for coming in today.  I will check some lab work but no med changes at this time.  Continue same dose Eliquis .  Glad to hear the CPAP has been effective.  Continue to stay active, watch fast food, sweet teas, sodas and recheck in 6 months for physical.  If any concerns on labs I will let you know.  Take care!

## 2024-01-23 NOTE — Progress Notes (Signed)
 Subjective:  Patient ID: James Jordan, male    DOB: 1970-07-13  Age: 53 y.o. MRN: [id number]  CC:  Chief Complaint  Patient presents with   Follow-up    Patient states nothing to discuss.     HPI James Jordan presents for   Elevated blood pressure reading with obstructive sleep apnea Prior elevated blood pressures but looked okay in the office at his May visit.  Plan for AutoPap at that time.  Plan for AutoPap at that time. Now on CPAP. Feeling better, sleeping better. No home BP readings.   BP Readings from Last 3 Encounters:  01/23/24 126/82  10/20/23 126/74  08/18/23 130/78    History of DVT/PE  Seen by hematology previously, Dr. Lonn, plan for indefinite treatment with reduced dose Eliquis .  2.5 mg twice daily. No new bleeding/bruising/hematuria.   Erectile dysfunction. Treated with Viagra  - 50mg  full dose - no HA/flushing or side effects. No hearing/vision changes, no CP/DOE.   4 years out form MALT lymphoma R eye, appt with [facility] May 8th, doing well - 1 more yearly follow up, then PRN rad onc surveillance.    Prediabetes: Exercise with work. Fast food once per week. very few sodas. Some powerade Lab Results  Component Value Date   HGBA1C 6.0 07/21/2023   Wt Readings from Last 3 Encounters:  01/23/24 (!) 313 lb 9.6 oz (142.2 kg)  10/20/23 (!) 308 lb 3.2 oz (139.8 kg)  08/18/23 (!) 310 lb (140.6 kg)    History Patient Active Problem List   Diagnosis Date Noted   Elevated BP without diagnosis of hypertension 04/08/2023   Bilateral lower extremity edema 06/02/2021   Bilateral pulmonary embolism (HCC) 04/05/2021   History of DVT (deep vein thrombosis)    PAC (premature atrial contraction) 06/01/2016   Obesity, Class II, BMI 35-39.9 04/01/2016   Past Medical History:  Diagnosis Date   ACL tear - left knee 2002   Per patient, not repaired    Cancer (HCC)    MALT lymphoma right eye   Complication of anesthesia    DVT (deep venous thrombosis) (HCC)     History of pulmonary embolus (PE)    Hypertension    Umbilical hernia 2003   Past Surgical History:  Procedure Laterality Date   EYE SURGERY Right 03/01/2022   cataract   EYE SURGERY Right 2020   MALT lymphoma surgery   HERNIA REPAIR  2004   Umbilical   TONSILLECTOMY     No Known Allergies Prior to Admission medications   Medication Sig Start Date End Date Taking? Authorizing Provider  apixaban  (ELIQUIS ) 2.5 MG TABS tablet Take 1 tablet (2.5 mg total) by mouth 2 (two) times daily. 04/08/23  Yes Gorsuch, Ni, MD  fluticasone  (FLONASE ) 50 MCG/ACT nasal spray Place 2 sprays into both nostrils daily. 03/31/23  Yes Levora Reyes SAUNDERS, MD  sildenafil  (VIAGRA ) 50 MG tablet Take 0.5-1 tablets (25-50 mg total) by mouth daily as needed for erectile dysfunction. 07/21/23  Yes Levora Reyes SAUNDERS, MD   Social History   Socioeconomic History   Marital status: Married    Spouse name: Not on file   Number of children: Not on file   Years of education: Not on file   Highest education level: Some college, no degree  Occupational History   Occupation: warehouse  Tobacco Use   Smoking status: Never    Passive exposure: Never   Smokeless tobacco: Former    Types: Snuff  Vaping Use  Vaping status: Never Used  Substance and Sexual Activity   Alcohol use: No   Drug use: No   Sexual activity: Yes  Other Topics Concern   Not on file  Social History Narrative   Lives with his wife.   Social Drivers of Corporate investment banker Strain: Low Risk  (01/19/2024)   Overall Financial Resource Strain (CARDIA)    Difficulty of Paying Living Expenses: Not hard at all  Food Insecurity: No Food Insecurity (01/19/2024)   Hunger Vital Sign    Worried About Running Out of Food in the Last Year: Never true    Ran Out of Food in the Last Year: Never true  Transportation Needs: No Transportation Needs (01/19/2024)   PRAPARE - Administrator, Civil Service (Medical): No    Lack of Transportation  (Non-Medical): No  Physical Activity: Sufficiently Active (01/19/2024)   Exercise Vital Sign    Days of Exercise per Week: 5 days    Minutes of Exercise per Session: 30 min  Stress: No Stress Concern Present (01/19/2024)   [employer] of Occupational Health - Occupational Stress Questionnaire    Feeling of Stress: Not at all  Social Connections: Moderately Isolated (01/19/2024)   Social Connection and Isolation Panel    Frequency of Communication with Friends and Family: Once a week    Frequency of Social Gatherings with Friends and Family: Once a week    Attends Religious Services: More than 4 times per year    Active Member of [employer] or Organizations: No    Attends Engineer, structural: Not on file    Marital Status: Married  Catering manager Violence: Not on file    Review of Systems Per HPI.   Objective:   Vitals:   01/23/24 1421  BP: 126/82  Pulse: 79  Temp: 98.1 F (36.7 C)  TempSrc: Oral  SpO2: 98%  Weight: (!) 313 lb 9.6 oz (142.2 kg)  Height: 6' 3 (1.905 m)     Physical Exam Vitals reviewed.  Constitutional:      Appearance: He is well-developed.  HENT:     Head: Normocephalic and atraumatic.  Neck:     Vascular: No carotid bruit or JVD.  Cardiovascular:     Rate and Rhythm: Normal rate and regular rhythm.     Heart sounds: Normal heart sounds. No murmur heard. Pulmonary:     Effort: Pulmonary effort is normal.     Breath sounds: Normal breath sounds. No rales.  Musculoskeletal:     Right lower leg: No edema.     Left lower leg: No edema.  Skin:    General: Skin is warm and dry.  Neurological:     Mental Status: He is alert and oriented to person, place, and time.  Psychiatric:        Mood and Affect: Mood normal.        Assessment & Plan:  James Jordan is a 53 y.o. male . OSA (obstructive sleep apnea)  - Stable, tolerating CPAP, blood pressure looks okay in office at this time.  No changes.  Medication monitoring encounter  - Plan: CBC Hx of long-term (current) use of anticoagulants - Plan: CBC  - Denies any new bleeding, check CBC.  Continue low-dose Eliquis .  Prediabetes - Plan: Comprehensive metabolic panel with GFR, Hemoglobin A1c  - Diet/exercise approach, guidance discussed including food choices, remain active, check A1c, labs above and adjust plan accordingly.  Erectile dysfunction  -viagra  Rx given -  use lowest effective dose. Side effects discussed (including but not limited to headache/flushing, blue discoloration of vision, possible vascular steal and risk of cardiac effects if underlying unknown coronary artery disease, and permanent sensorineural hearing loss). Understanding expressed.   No orders of the defined types were placed in this encounter.  Patient Instructions  Thanks for coming in today.  I will check some lab work but no med changes at this time.  Continue same dose Eliquis .  Glad to hear the CPAP has been effective.  Continue to stay active, watch fast food, sweet teas, sodas and recheck in 6 months for physical.  If any concerns on labs I will let you know.  Take care!    Signed,   Reyes Pines, MD [facility], [facility] 01/23/24 3:27 PM

## 2024-01-24 ENCOUNTER — Encounter: Payer: Self-pay | Admitting: Family Medicine

## 2024-01-24 LAB — COMPREHENSIVE METABOLIC PANEL WITH GFR
ALT: 19 U/L (ref 0–53)
AST: 15 U/L (ref 0–37)
Albumin: 4.4 g/dL (ref 3.5–5.2)
Alkaline Phosphatase: 52 U/L (ref 39–117)
BUN: 13 mg/dL (ref 6–23)
CO2: 31 meq/L (ref 19–32)
Calcium: 9.1 mg/dL (ref 8.4–10.5)
Chloride: 102 meq/L (ref 96–112)
Creatinine, Ser: 0.99 mg/dL (ref 0.40–1.50)
GFR: 88.36 mL/min (ref >60.00)
Glucose, Bld: 81 mg/dL (ref 70–99)
Potassium: 4.2 meq/L (ref 3.5–5.1)
Sodium: 139 meq/L (ref 135–145)
Total Bilirubin: 0.8 mg/dL (ref 0.2–1.2)
Total Protein: 7.2 g/dL (ref 6.0–8.3)

## 2024-01-24 LAB — CBC
HCT: 46.3 % (ref 39.0–52.0)
Hemoglobin: 15.1 g/dL (ref 13.0–17.0)
MCHC: 32.7 g/dL (ref 30.0–36.0)
MCV: 91.5 fl (ref 78.0–100.0)
Platelets: 264 K/uL (ref 150.0–400.0)
RBC: 5.06 Mil/uL (ref 4.22–5.81)
RDW: 13.8 % (ref 11.5–15.5)
WBC: 8 K/uL (ref 4.0–10.5)

## 2024-01-24 LAB — HEMOGLOBIN A1C: Hgb A1c MFr Bld: 6.4 % (ref 4.6–6.5)

## 2024-01-24 MED ORDER — SILDENAFIL CITRATE 50 MG PO TABS: 25.0000 mg | ORAL_TABLET | Freq: Every day | ORAL | 6 refills | Status: AC | PRN

## 2024-01-26 ENCOUNTER — Ambulatory Visit: Payer: Self-pay | Admitting: Family Medicine

## 2024-03-12 ENCOUNTER — Ambulatory Visit: Admitting: Neurology

## 2024-03-12 VITALS — BP 124/80 | HR 59 | Ht 75.0 in | Wt 313.2 lb

## 2024-03-12 DIAGNOSIS — G4733 Obstructive sleep apnea (adult) (pediatric): Secondary | ICD-10-CM | POA: Diagnosis not present

## 2024-03-12 DIAGNOSIS — G4734 Idiopathic sleep related nonobstructive alveolar hypoventilation: Secondary | ICD-10-CM

## 2024-03-12 NOTE — Progress Notes (Signed)
 Subjective:    Patient ID: James Jordan is a 53 y.o. male.  HPI    Interim history:   James Jordan is a 53 year old male with an underlying medical history of lymphoma of the right eye, history of DVT and PE, on Eliquis , ED, history of left knee ACL tear, and obesity, who presents for follow-up consultation of his obstructive sleep apnea after interim testing and starting home AutoPap therapy.  The patient is unaccompanied today.  I first met him at the request of his primary care physician on 08/18/2023, at which time he reported sleep disruption and snoring, some daytime tiredness.  He was advised to proceed with a sleep study.  He had a Home sleep test through our office on 09/11/2023 which showed moderate obstructive sleep apnea with a total AHI of 17/hour and O2 nadir of 68.6%. Mild to moderate snoring was detected.  He was advised to proceed with home AutoPap therapy.  His set up date was 10/25/2023.  He has a ResMed AirSense 11 AutoSet machine.  His DME provider is [facility].  Today, 03/12/2024: I reviewed his AutoPap compliance data for the past 30 days from 02/07/2024 through 03/07/2024, during which time he used his machine every night with percent use days greater than 4 hours at 100%, indicating excellent compliance with average usage of 6 hours and 47 minutes, residual AHI at goal at 2.0/h, 95th percentile of pressure at 9.5 cm with a range of 7 to 14 cm with EPR of 3.  Leak acceptable with a 95th percentile at 13.8 L/min.  He reports doing well.  He is compliant with treatment and has adjusted surprisingly well to treatment he feels.  His Epworth sleepiness score is 5 out of 24.  He has benefited from treatment and is very motivated to continue with it.  He feels improved with regards to his daytime energy and feeling more rested.  He is working on weight loss.  He is willing to do an overnight pulse oximetry test to recheck his oxygen levels while on treatment.  He has a follow-up pending  with his oncologist.  He is using a fullface mask with good tolerance.  Previously:  08/18/2023: (He) reports some sleep disruption and some daytime tiredness, was encouraged to seek evaluation for sleep apnea by his hematologist.  He reports that in the past he had snoring and gasping for air.  He reports that lately he has not had any snoring.  His Epworth sleepiness score is 9 out of 24, fatigue severity score is 24 out of 63.  I reviewed your office note from 07/21/2023.  He lives with his wife and 2 children, ages 55 and 25.  They have 2 dogs in the household.  The dogs do not sleep in the bedroom.  Does have a TV in the bedroom but it is typically not on at night.  Has nocturia about once or twice per average night.  Bedtime is between 8:30 PM and 9 and rise time around 4.  He works in a Naval architect.  He reports still getting adjusted to his new cyst.  He denies recurrent headaches.  Drinks 1 cup of in the morning.  He is a non-smoker and does not drink alcohol.  He reports a family history of alcoholism.  He has lower extremity swelling and had a DVT in the left leg.  The patient's allergies, current medications, family history, past medical history, past social history, past surgical history and problem list were reviewed  and updated as appropriate.    His Past Medical History Is Significant For: Past Medical History:  Diagnosis Date   ACL tear - left knee 2002   Per patient, not repaired    Cancer (HCC)    MALT lymphoma right eye   Complication of anesthesia    DVT (deep venous thrombosis) (HCC)    History of pulmonary embolus (PE)    Hypertension    Umbilical hernia 2003    His Past Surgical History Is Significant For: Past Surgical History:  Procedure Laterality Date   EYE SURGERY Right 03/01/2022   cataract   EYE SURGERY Right 2020   MALT lymphoma surgery   HERNIA REPAIR  2004   Umbilical   TONSILLECTOMY      His Family History Is Significant For: Family History  Problem  Relation Age of Onset   Diabetes Mother    Heart disease Maternal Aunt    Diabetes Maternal Uncle    Diabetes Maternal Grandfather    Lung cancer Paternal Grandmother    Lung cancer Paternal Grandfather     His Social History Is Significant For: Social History   Socioeconomic History   Marital status: Married    Spouse name: Not on file   Number of children: Not on file   Years of education: Not on file   Highest education level: Some college, no degree  Occupational History   Occupation: Naval architect  Tobacco Use   Smoking status: Never    Passive exposure: Never   Smokeless tobacco: Former    Types: Snuff  Vaping Use   Vaping status: Never Used  Substance and Sexual Activity   Alcohol use: No   Drug use: No   Sexual activity: Yes  Other Topics Concern   Not on file  Social History Narrative   Lives with his wife.   Social Drivers of Corporate investment banker Strain: Low Risk  (01/19/2024)   Overall Financial Resource Strain (CARDIA)    Difficulty of Paying Living Expenses: Not hard at all  Food Insecurity: No Food Insecurity (01/19/2024)   Hunger Vital Sign    Worried About Running Out of Food in the Last Year: Never true    Ran Out of Food in the Last Year: Never true  Transportation Needs: No Transportation Needs (01/19/2024)   PRAPARE - Administrator, Civil Service (Medical): No    Lack of Transportation (Non-Medical): No  Physical Activity: Sufficiently Active (01/19/2024)   Exercise Vital Sign    Days of Exercise per Week: 5 days    Minutes of Exercise per Session: 30 min  Stress: No Stress Concern Present (01/19/2024)   [employer] of Occupational Health - Occupational Stress Questionnaire    Feeling of Stress: Not at all  Social Connections: Moderately Isolated (01/19/2024)   Social Connection and Isolation Panel    Frequency of Communication with Friends and Family: Once a week    Frequency of Social Gatherings with Friends and Family: Once a  week    Attends Religious Services: More than 4 times per year    Active Member of [employer] or Organizations: No    Attends Engineer, structural: Not on file    Marital Status: Married    His Allergies Are:  No Known Allergies:   His Current Medications Are:  Outpatient Encounter Medications as of 03/12/2024  Medication Sig   apixaban  (ELIQUIS ) 2.5 MG TABS tablet Take 1 tablet (2.5 mg total) by mouth 2 (two)  times daily.   fluticasone  (FLONASE ) 50 MCG/ACT nasal spray Place 2 sprays into both nostrils daily.   sildenafil  (VIAGRA ) 50 MG tablet Take 0.5-1 tablets (25-50 mg total) by mouth daily as needed for erectile dysfunction.   No facility-administered encounter medications on file as of 03/12/2024.  :  Review of Systems:  Out of a complete 14 point review of systems, all are reviewed and negative with the exception of these symptoms as listed below:  Review of Systems  Neurological:        Pt doing much better, feels rested.  ESS 5. FSS 16.     Objective:  Neurological Exam  Physical Exam Physical Examination:   Vitals:   03/12/24 1306  BP: 124/80  Pulse: (!) 59    General Examination: The patient is a very pleasant 53 y.o. male in no acute distress. He appears well-developed and well-nourished and well groomed.   HEENT: Normocephalic, atraumatic, pupils are equal, round and reactive to light, extraocular tracking is well-preserved. Hearing is grossly intact. Face is symmetric with normal facial animation. Speech is clear with no dysarthria noted. There is no hypophonia. There is no lip, neck/head, jaw or voice tremor. Neck is supple with full range of passive and active motion. There are no carotid bruits on auscultation. Oropharynx exam reveals: mild mouth dryness, adequate dental hygiene and moderate airway crowding.  Tongue protrudes centrally and palate elevates symmetrically.   Chest: Clear to auscultation without wheezing, rhonchi or crackles noted.   Heart:  S1+S2+0, regular and normal without murmurs, rubs or gallops noted.  Mild bradycardia.   Abdomen: Soft, non-tender and non-distended.   Extremities: There is chronic appearing swelling in the distal lower extremities bilaterally with mild erythema noted as well.    Skin: Warm and dry without trophic changes noted.    Musculoskeletal: exam reveals no obvious joint deformities.    Neurologically:  Mental status: The patient is awake, alert and oriented in all 4 spheres. His immediate and remote memory, attention, language skills and fund of knowledge are appropriate. There is no evidence of aphasia, agnosia, apraxia or anomia. Speech is clear with normal prosody and enunciation. Thought process is linear. Mood is normal and affect is normal.  Cranial nerves II - XII are as described above under HEENT exam.  Motor exam: Normal bulk, strength and tone is noted. There is no obvious action or resting tremor.  Fine motor skills and coordination: grossly intact.  Cerebellar testing: No dysmetria or intention tremor. There is no truncal or gait ataxia.  Sensory exam: intact to light touch in the upper and lower extremities.  Gait, station and balance: He stands easily. No veering to one side is noted. No leaning to one side is noted. Posture is age-appropriate and stance is narrow based. Gait shows normal stride length and normal pace. No problems turning are noted.    Assessment and plan:  In summary, James Jordan is a very pleasant 53 year old male with an underlying medical history of lymphoma of the right eye, history of DVT and PE, on Eliquis , ED, history of left knee ACL tear, and obesity, who presents for follow-up consultation of his obstructive sleep apnea after interim testing and starting home AutoPap therapy.  His Home sleep test through our office on 09/11/2023 showed moderate obstructive sleep apnea with a total AHI of 17/hour and O2 nadir of 68.6%. Mild to moderate snoring was detected.   He has been on home AutoPap therapy since 10/25/2023.  He has  a ResMed AirSense 11 AutoSet machine.  His DME provider is [facility].  He has been compliant with treatment, he feels improved with regards to his sleep quality and daytime energy.  He is motivated to continue with treatment.  We talked about his sleep test results and his compliance data in detail today.  He would be willing to proceed with an overnight pulse oximetry test.  I want to make sure that his oxygen saturations are indeed good throughout the night when he is on PAP therapy.  He is commended for his treatment adherence.  As long as his overnight pulse oximetry test on PAP therapy looks benign, we will plan to see him in this clinic on a yearly basis.  He is advised to go ahead and make his follow-up appointment on his way out and we will keep him posted as to his ONO results by phone call and/or MyChart message.  I answered all his questions today and he was in agreement with our plan. I spent 40 minutes in total face-to-face time and in reviewing records during pre-charting, more than 50% of which was spent in counseling and coordination of care, reviewing test results, reviewing medications and treatment regimen and/or in discussing or reviewing the diagnosis of OSA, oxygen desaturations during sleep, the prognosis and treatment options. Pertinent laboratory and imaging test results that were available during this visit with the patient were reviewed by me and considered in my medical decision making (see chart for details).

## 2024-03-12 NOTE — Patient Instructions (Addendum)
 It was nice to see you again today. I am glad to hear, things are going well with your autoPAP therapy. You have adjusted well to treatment with your new machine, and you are compliant with it. You have also fulfilled the insurance-mandated compliance percentage, which is reassuring, so you can get ongoing supplies through your insurance. Please talk to your DME provider about getting replacement supplies on a regular basis. Please be sure to change your filter every month, your mask about every 3 months, hose about every 6 months, humidifier chamber about yearly. Some restrictions are imposed by your insurance carrier with regard to how frequently you can get certain supplies.  Your DME company can provide further details if necessary.   Please continue using your autoPAP regularly. While your insurance requires that you use PAP at least 4 hours each night on 70% of the nights, I recommend, that you not skip any nights and use it throughout the night if you can. Getting used to PAP and staying with the treatment long term does take time and patience and discipline. Untreated obstructive sleep apnea when it is moderate to severe can have an adverse impact on cardiovascular health and raise her risk for heart disease, arrhythmias, hypertension, congestive heart failure, stroke and diabetes. Untreated obstructive sleep apnea causes sleep disruption, nonrestorative sleep, and sleep deprivation. This can have an impact on your day to day functioning and cause daytime sleepiness and impairment of cognitive function, memory loss, mood disturbance, and problems focussing. Using PAP regularly can improve these symptoms. As discussed, we will do an overnight oxygen level test, called ONO, and your DME company will call and set this up for one night, while you also use your autoPAP as usual. We will call you with the results. This is to make sure that your oxygen levels stay in the 90s, while you are treated with autoPAP  for your OSA. Remember, your oxygen levels dropped into the higher 60s during your home sleep test.  So long as the test results are benign we will call you and keep your follow-up appointment as scheduled.    We can see you in 1 year, you can see one of our nurse practitioners as you are stable.

## 2024-04-05 ENCOUNTER — Ambulatory Visit: Admitting: Family Medicine

## 2024-04-05 ENCOUNTER — Encounter: Payer: Self-pay | Admitting: Family Medicine

## 2024-04-05 VITALS — BP 138/74 | HR 65 | Temp 98.0°F | Wt 314.0 lb

## 2024-04-05 DIAGNOSIS — C884 Extranodal marginal zone b-cell lymphoma of mucosa-associated lymphoid tissue (malt-lymphoma) not having achieved remission: Secondary | ICD-10-CM | POA: Diagnosis not present

## 2024-04-05 DIAGNOSIS — I2699 Other pulmonary embolism without acute cor pulmonale: Secondary | ICD-10-CM | POA: Diagnosis not present

## 2024-04-05 NOTE — Patient Instructions (Signed)
 Thank you for coming in today. No change in medications at this time. We will let you know when the paperwork is completed.

## 2024-04-05 NOTE — Progress Notes (Signed)
 Subjective:  Patient ID: James Jordan, male    DOB: 1970/10/19  Age: 53 y.o. MRN: [id number]  CC:  Chief Complaint  Patient presents with   fmla    Previous forms printed    HPI James Jordan presents for   History of pulmonary embolus, deep vein thrombosis DVT, PE in 2022, followed by hematology, Dr. Lazaro such.  Indefinite treatment with reduced dose Eliquis .  Required time out of work previously with episodes of dizziness in September of last year, FMLA paperwork has been completed for those symptoms and routine follow-ups with specialists.  Appt next week with Dr. Lonn next week.   Has also required follow-up with ophthalmology, prior multiple lymphoma of right eye, yearly follow-up. Appt next May.   Chronic conditions were discussed in August.  Here for repeat paperwork completion.  Has not had dizziness causing loss of work. On CPAP for OSA - feeling better.    History Patient Active Problem List   Diagnosis Date Noted   Elevated BP without diagnosis of hypertension 04/08/2023   Bilateral lower extremity edema 06/02/2021   Bilateral pulmonary embolism (HCC) 04/05/2021   History of DVT (deep vein thrombosis)    PAC (premature atrial contraction) 06/01/2016   Obesity, Class II, BMI 35-39.9 04/01/2016   Past Medical History:  Diagnosis Date   ACL tear - left knee 2002   Per patient, not repaired    Cancer (HCC)    MALT lymphoma right eye   Complication of anesthesia    DVT (deep venous thrombosis) (HCC)    History of pulmonary embolus (PE)    Hypertension    Umbilical hernia 2003   Past Surgical History:  Procedure Laterality Date   EYE SURGERY Right 03/01/2022   cataract   EYE SURGERY Right 2020   MALT lymphoma surgery   HERNIA REPAIR  2004   Umbilical   TONSILLECTOMY     No Known Allergies Prior to Admission medications   Medication Sig Start Date End Date Taking? Authorizing Provider  apixaban  (ELIQUIS ) 2.5 MG TABS tablet Take 1 tablet (2.5  mg total) by mouth 2 (two) times daily. 04/08/23   Lonn Hicks, MD  fluticasone  (FLONASE ) 50 MCG/ACT nasal spray Place 2 sprays into both nostrils daily. 03/31/23   Levora Reyes SAUNDERS, MD  sildenafil  (VIAGRA ) 50 MG tablet Take 0.5-1 tablets (25-50 mg total) by mouth daily as needed for erectile dysfunction. 01/24/24   Levora Reyes SAUNDERS, MD   Social History   Socioeconomic History   Marital status: Married    Spouse name: Not on file   Number of children: Not on file   Years of education: Not on file   Highest education level: Some college, no degree  Occupational History   Occupation: warehouse  Tobacco Use   Smoking status: Never    Passive exposure: Never   Smokeless tobacco: Former    Types: Snuff  Vaping Use   Vaping status: Never Used  Substance and Sexual Activity   Alcohol use: No   Drug use: No   Sexual activity: Yes  Other Topics Concern   Not on file  Social History Narrative   Lives with his wife.   Social Drivers of Corporate investment banker Strain: Low Risk  (01/19/2024)   Overall Financial Resource Strain (CARDIA)    Difficulty of Paying Living Expenses: Not hard at all  Food Insecurity: No Food Insecurity (01/19/2024)   Hunger Vital Sign    Worried About Running Out  of Food in the Last Year: Never true    Ran Out of Food in the Last Year: Never true  Transportation Needs: No Transportation Needs (01/19/2024)   PRAPARE - Administrator, Civil Service (Medical): No    Lack of Transportation (Non-Medical): No  Physical Activity: Sufficiently Active (01/19/2024)   Exercise Vital Sign    Days of Exercise per Week: 5 days    Minutes of Exercise per Session: 30 min  Stress: No Stress Concern Present (01/19/2024)   [employer] of Occupational Health - Occupational Stress Questionnaire    Feeling of Stress: Not at all  Social Connections: Moderately Isolated (01/19/2024)   Social Connection and Isolation Panel    Frequency of Communication with Friends  and Family: Once a week    Frequency of Social Gatherings with Friends and Family: Once a week    Attends Religious Services: More than 4 times per year    Active Member of [employer] or Organizations: No    Attends Engineer, structural: Not on file    Marital Status: Married  Catering manager Violence: Not on file    Review of Systems   Objective:   Vitals:   04/05/24 1551  BP: 138/74  Pulse: 65  Temp: 98 F (36.7 C)  SpO2: 96%  Weight: (!) 314 lb (142.4 kg)    Physical Exam Constitutional:      General: He is not in acute distress.    Appearance: Normal appearance. He is well-developed.  HENT:     Head: Normocephalic and atraumatic.  Cardiovascular:     Rate and Rhythm: Normal rate.  Pulmonary:     Effort: Pulmonary effort is normal.  Neurological:     Mental Status: He is alert and oriented to person, place, and time.  Psychiatric:        Mood and Affect: Mood normal.        Assessment & Plan:  James Jordan is a 53 y.o. male . Bilateral pulmonary embolism (HCC)  - Tolerating anticoagulation, regular office visits with oncology, FMLA paperwork completed to allow those visits along with his oncologist through [facility] for MALT lymphoma [facility]), as well as PCP follow-ups.  Paperwork completed, provided to patient during office visit, copy for scan to chart. Has follow-up with me in 4 months.    No orders of the defined types were placed in this encounter.  Patient Instructions  Thank you for coming in today. No change in medications at this time. We will let you know when the paperwork is completed.     Signed,   Reyes Pines, MD [facility], [facility] 04/05/24 5:05 PM

## 2024-04-10 ENCOUNTER — Encounter: Payer: Self-pay | Admitting: Neurology

## 2024-04-13 ENCOUNTER — Other Ambulatory Visit: Payer: Self-pay

## 2024-04-13 ENCOUNTER — Inpatient Hospital Stay: Payer: Managed Care, Other (non HMO) | Attending: Hematology and Oncology | Admitting: Hematology and Oncology

## 2024-04-13 ENCOUNTER — Encounter: Payer: Self-pay | Admitting: Hematology and Oncology

## 2024-04-13 ENCOUNTER — Inpatient Hospital Stay: Payer: Managed Care, Other (non HMO)

## 2024-04-13 VITALS — BP 146/80 | HR 67 | Temp 97.8°F | Resp 18 | Ht 75.0 in | Wt 307.4 lb

## 2024-04-13 DIAGNOSIS — E66811 Obesity, class 1: Secondary | ICD-10-CM | POA: Diagnosis not present

## 2024-04-13 DIAGNOSIS — Z86711 Personal history of pulmonary embolism: Secondary | ICD-10-CM | POA: Insufficient documentation

## 2024-04-13 DIAGNOSIS — Z7901 Long term (current) use of anticoagulants: Secondary | ICD-10-CM | POA: Insufficient documentation

## 2024-04-13 DIAGNOSIS — Z86718 Personal history of other venous thrombosis and embolism: Secondary | ICD-10-CM | POA: Diagnosis not present

## 2024-04-13 DIAGNOSIS — R7303 Prediabetes: Secondary | ICD-10-CM | POA: Diagnosis present

## 2024-04-13 MED ORDER — APIXABAN 2.5 MG PO TABS: 2.5000 mg | ORAL_TABLET | Freq: Two times a day (BID) | ORAL | 3 refills | Status: AC

## 2024-04-13 MED ORDER — METFORMIN HCL 500 MG PO TABS: 500.0000 mg | ORAL_TABLET | Freq: Two times a day (BID) | ORAL | 11 refills | Status: DC

## 2024-04-13 MED ORDER — METFORMIN HCL 500 MG PO TABS: 500.0000 mg | ORAL_TABLET | Freq: Two times a day (BID) | ORAL | 3 refills | Status: AC

## 2024-04-13 NOTE — Assessment & Plan Note (Addendum)
 We discussed lifestyle changes We discussed risk and benefits of metformin and he is in agreement to try

## 2024-04-13 NOTE — Assessment & Plan Note (Addendum)
 From our previous visit, the patient has agree on extended duration Eliquis  at reduced dose indefinitely We discussed risk factors for recurrent thrombosis He has no bleeding complications so far I refilled his prescription today and plan to see him back in a year

## 2024-04-13 NOTE — Progress Notes (Signed)
 [facility] OFFICE PROGRESS NOTE  Patient Care Team: Levora Reyes SAUNDERS, MD as PCP - General (Family Medicine) Dann Candyce RAMAN, MD as PCP - Cardiology (Cardiology) Lonn Hicks, MD as Consulting Physician (Hematology and Oncology)  Assessment & Plan Prediabetes We discussed lifestyle changes We discussed risk and benefits of metformin and he is in agreement to try History of DVT (deep vein thrombosis) From our previous visit, the patient has agree on extended duration Eliquis  at reduced dose indefinitely We discussed risk factors for recurrent thrombosis He has no bleeding complications so far I refilled his prescription today and plan to see him back in a year Obesity, Class I, BMI 30-34.9 The patient is obese with major cardiovascular risk factors He has been prediabetic for several years We spent a lot of time discussing importance of exercise and weight loss  No orders of the defined types were placed in this encounter.    Hicks Lonn, MD  INTERVAL HISTORY: he returns for surveillance follow-up for history of DVT/PE Patient denies recent bleeding such as epistaxis, hematuria or hematochezia We reviewed medication list and discussed medication changes We discussed test results and future plan of care as outlined above  PHYSICAL EXAMINATION: ECOG PERFORMANCE STATUS: 0 - Asymptomatic  Vitals:   04/13/24 0837  BP: (!) 146/80  Pulse: 67  Resp: 18  Temp: 97.8 F (36.6 C)  SpO2: 97%   Lab Results  Component Value Date   WBC 8.0 01/23/2024   HGB 15.1 01/23/2024   HCT 46.3 01/23/2024   MCV 91.5 01/23/2024   PLT 264.0 01/23/2024    SUMMARY OF HEMATOLOGIC HISTORY:  James Jordan was seen in December 2022 due to history of DVT/PE  the patient had traveled to the beach [place] ago and has been complaining of intermittent left lower extremity achiness He has intermittent leg swelling, tenderness but denies trauma to the left lower  extremity Previously, he had a very stressful job but over the past 2 months, he had moved to a different role.   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     LEFT:  - Findings consistent with acute deep vein thrombosis involving the left popliteal vein.  - Findings consistent with age indeterminate deep vein thrombosis involving the left femoral vein, left peroneal veins, and left posterior tibial veins.   CT angiogram showed 1. Large volume of pulmonary emboli in the lungs bilaterally, with dilatation of the right atrium and right ventricle (RV to LV ratio of 1.1) indicative of elevated right-sided heart pressures and right heart strain. These findings have been shown to be associated with a increased morbidity and mortality in the setting pulmonary embolism. 2. No evidence of pulmonary infarct or significant pleural effusion. 3. Hepatic steatosis.  He was admitted and appropriately anticoagulated and was discharged on Eliquis  He is compliant taking Eliquis  as directed Recently, he felt some weird tingling sensation and he presented to the emergency department for evaluation  On May 28, 2021, repeat venous Doppler ultrasound showed  RIGHT:  - No evidence of common femoral vein obstruction.     LEFT:  - Findings consistent with age indeterminate deep vein thrombosis involving the left popliteal vein, left posterior tibial veins, and left peroneal veins.  - Findings appear  essentially unchanged compared to previous examination  Repeat CT imaging of the chest, abdomen and pelvis showed 1. Sequela of chronic pulmonary emboli, with thin residual synechia within the bilateral lower lobe pulmonary arteries. The significant majority of the pulmonary  emboli seen previously have resolved in the interim. 2. No acute pulmonary embolus. 3. Hepatic steatosis. 4. Colonic diverticulosis without diverticulitis. 5. Fat containing umbilical and right inguinal hernias. No bowel herniation.   He had prior surgeries before and never had perioperative thromboembolic events. The patient had never been on testosterone replacement therapy  There is no family history of blood clots or miscarriages. He denies bleeding complications from anticoagulation therapy

## 2024-04-13 NOTE — Assessment & Plan Note (Addendum)
 The patient is obese with major cardiovascular risk factors He has been prediabetic for several years We spent a lot of time discussing importance of exercise and weight loss

## 2024-07-23 ENCOUNTER — Encounter: Admitting: Family Medicine

## 2024-07-23 DIAGNOSIS — Z131 Encounter for screening for diabetes mellitus: Secondary | ICD-10-CM

## 2024-07-23 DIAGNOSIS — Z7901 Long term (current) use of anticoagulants: Secondary | ICD-10-CM

## 2024-07-23 DIAGNOSIS — Z0000 Encounter for general adult medical examination without abnormal findings: Secondary | ICD-10-CM

## 2024-07-23 DIAGNOSIS — Z13220 Encounter for screening for lipoid disorders: Secondary | ICD-10-CM

## 2024-07-23 DIAGNOSIS — Z1329 Encounter for screening for other suspected endocrine disorder: Secondary | ICD-10-CM

## 2024-07-23 DIAGNOSIS — Z5181 Encounter for therapeutic drug level monitoring: Secondary | ICD-10-CM

## 2025-03-12 ENCOUNTER — Ambulatory Visit: Admitting: Neurology

## 2025-04-12 ENCOUNTER — Inpatient Hospital Stay: Attending: Hematology and Oncology | Admitting: Hematology and Oncology
